# Patient Record
Sex: Male | Born: 1955 | Race: Black or African American | Hispanic: No | Marital: Single | Smoking: Current every day smoker
Health system: Southern US, Community
[De-identification: ages and names within clinical notes are randomized; demographics above are authoritative.]

## PROBLEM LIST (undated history)

## (undated) ENCOUNTER — Emergency Department (HOSPITAL_COMMUNITY): Disposition: A | Discharge status: Home / Self Care | Payer: Medicare Other

## (undated) DIAGNOSIS — K253 Acute gastric ulcer without hemorrhage or perforation: Secondary | ICD-10-CM

## (undated) DIAGNOSIS — I639 Cerebral infarction, unspecified: Secondary | ICD-10-CM

## (undated) DIAGNOSIS — H5440 Blindness, one eye, unspecified eye: Secondary | ICD-10-CM

## (undated) DIAGNOSIS — F1410 Cocaine abuse, uncomplicated: Secondary | ICD-10-CM

## (undated) DIAGNOSIS — R569 Unspecified convulsions: Secondary | ICD-10-CM

## (undated) DIAGNOSIS — G40909 Epilepsy, unspecified, not intractable, without status epilepticus: Secondary | ICD-10-CM

## (undated) HISTORY — DX: Cerebral infarction, unspecified: I63.9

## (undated) HISTORY — PX: NO PAST SURGERIES: SHX2092

## (undated) HISTORY — DX: Acute gastric ulcer without hemorrhage or perforation: K25.3

## (undated) HISTORY — DX: Cocaine abuse, uncomplicated: F14.10

---

## 1997-10-26 ENCOUNTER — Emergency Department (HOSPITAL_COMMUNITY): Admission: EM | Admit: 1997-10-26 | Discharge: 1997-10-26 | Payer: Self-pay | Admitting: Emergency Medicine

## 1997-11-02 ENCOUNTER — Other Ambulatory Visit: Admission: RE | Admit: 1997-11-02 | Discharge: 1997-11-02 | Payer: Self-pay | Admitting: Family Medicine

## 1998-02-04 ENCOUNTER — Emergency Department (HOSPITAL_COMMUNITY): Admission: EM | Admit: 1998-02-04 | Discharge: 1998-02-04 | Payer: Self-pay | Admitting: Emergency Medicine

## 1998-04-05 ENCOUNTER — Emergency Department (HOSPITAL_COMMUNITY): Admission: EM | Admit: 1998-04-05 | Discharge: 1998-04-05 | Payer: Self-pay | Admitting: Emergency Medicine

## 1998-08-16 ENCOUNTER — Emergency Department (HOSPITAL_COMMUNITY): Admission: EM | Admit: 1998-08-16 | Discharge: 1998-08-16 | Payer: Self-pay | Admitting: Emergency Medicine

## 1999-04-05 ENCOUNTER — Emergency Department (HOSPITAL_COMMUNITY): Admission: EM | Admit: 1999-04-05 | Discharge: 1999-04-05 | Payer: Self-pay | Admitting: Emergency Medicine

## 1999-04-05 ENCOUNTER — Encounter: Payer: Self-pay | Admitting: Emergency Medicine

## 2000-10-03 ENCOUNTER — Emergency Department (HOSPITAL_COMMUNITY): Admission: EM | Admit: 2000-10-03 | Discharge: 2000-10-03 | Payer: Self-pay | Admitting: Emergency Medicine

## 2001-06-10 ENCOUNTER — Emergency Department (HOSPITAL_COMMUNITY): Admission: EM | Admit: 2001-06-10 | Discharge: 2001-06-10 | Payer: Self-pay | Admitting: Emergency Medicine

## 2001-07-21 ENCOUNTER — Emergency Department (HOSPITAL_COMMUNITY): Admission: EM | Admit: 2001-07-21 | Discharge: 2001-07-21 | Payer: Self-pay | Admitting: *Deleted

## 2001-08-02 ENCOUNTER — Emergency Department (HOSPITAL_COMMUNITY): Admission: EM | Admit: 2001-08-02 | Discharge: 2001-08-03 | Payer: Self-pay | Admitting: Emergency Medicine

## 2001-08-02 ENCOUNTER — Encounter: Payer: Self-pay | Admitting: Emergency Medicine

## 2001-08-04 ENCOUNTER — Inpatient Hospital Stay (HOSPITAL_COMMUNITY): Admission: EM | Admit: 2001-08-04 | Discharge: 2001-08-06 | Payer: Self-pay | Admitting: Emergency Medicine

## 2001-08-04 ENCOUNTER — Encounter: Payer: Self-pay | Admitting: Internal Medicine

## 2001-08-05 ENCOUNTER — Encounter: Payer: Self-pay | Admitting: Internal Medicine

## 2001-10-30 ENCOUNTER — Emergency Department (HOSPITAL_COMMUNITY): Admission: EM | Admit: 2001-10-30 | Discharge: 2001-10-30 | Payer: Self-pay | Admitting: Emergency Medicine

## 2002-01-01 ENCOUNTER — Emergency Department (HOSPITAL_COMMUNITY): Admission: EM | Admit: 2002-01-01 | Discharge: 2002-01-01 | Payer: Self-pay | Admitting: Emergency Medicine

## 2002-03-03 ENCOUNTER — Emergency Department (HOSPITAL_COMMUNITY): Admission: EM | Admit: 2002-03-03 | Discharge: 2002-03-03 | Payer: Self-pay | Admitting: Emergency Medicine

## 2002-05-09 ENCOUNTER — Encounter: Payer: Self-pay | Admitting: Emergency Medicine

## 2002-05-09 ENCOUNTER — Emergency Department (HOSPITAL_COMMUNITY): Admission: EM | Admit: 2002-05-09 | Discharge: 2002-05-09 | Payer: Self-pay | Admitting: Emergency Medicine

## 2002-06-11 ENCOUNTER — Emergency Department (HOSPITAL_COMMUNITY): Admission: EM | Admit: 2002-06-11 | Discharge: 2002-06-11 | Payer: Self-pay | Admitting: Emergency Medicine

## 2002-07-04 ENCOUNTER — Emergency Department (HOSPITAL_COMMUNITY): Admission: EM | Admit: 2002-07-04 | Discharge: 2002-07-04 | Payer: Self-pay | Admitting: Emergency Medicine

## 2002-07-04 ENCOUNTER — Encounter: Payer: Self-pay | Admitting: Emergency Medicine

## 2002-07-19 ENCOUNTER — Emergency Department (HOSPITAL_COMMUNITY): Admission: EM | Admit: 2002-07-19 | Discharge: 2002-07-19 | Payer: Self-pay | Admitting: Emergency Medicine

## 2002-10-29 ENCOUNTER — Emergency Department (HOSPITAL_COMMUNITY): Admission: EM | Admit: 2002-10-29 | Discharge: 2002-10-29 | Payer: Self-pay | Admitting: Emergency Medicine

## 2003-02-05 ENCOUNTER — Emergency Department (HOSPITAL_COMMUNITY): Admission: EM | Admit: 2003-02-05 | Discharge: 2003-02-06 | Payer: Self-pay | Admitting: Emergency Medicine

## 2003-03-08 ENCOUNTER — Emergency Department (HOSPITAL_COMMUNITY): Admission: EM | Admit: 2003-03-08 | Discharge: 2003-03-09 | Payer: Self-pay | Admitting: Emergency Medicine

## 2003-06-02 ENCOUNTER — Emergency Department (HOSPITAL_COMMUNITY): Admission: EM | Admit: 2003-06-02 | Discharge: 2003-06-02 | Payer: Self-pay | Admitting: Emergency Medicine

## 2003-06-22 ENCOUNTER — Emergency Department (HOSPITAL_COMMUNITY): Admission: EM | Admit: 2003-06-22 | Discharge: 2003-06-22 | Payer: Self-pay | Admitting: Emergency Medicine

## 2004-01-23 ENCOUNTER — Ambulatory Visit: Payer: Self-pay | Admitting: Nurse Practitioner

## 2004-03-10 ENCOUNTER — Emergency Department (HOSPITAL_COMMUNITY): Admission: EM | Admit: 2004-03-10 | Discharge: 2004-03-10 | Payer: Self-pay | Admitting: Emergency Medicine

## 2004-05-15 ENCOUNTER — Ambulatory Visit: Payer: Self-pay | Admitting: Nurse Practitioner

## 2004-05-28 ENCOUNTER — Emergency Department (HOSPITAL_COMMUNITY): Admission: EM | Admit: 2004-05-28 | Discharge: 2004-05-28 | Payer: Self-pay | Admitting: Emergency Medicine

## 2004-07-14 ENCOUNTER — Emergency Department (HOSPITAL_COMMUNITY): Admission: EM | Admit: 2004-07-14 | Discharge: 2004-07-14 | Payer: Self-pay | Admitting: *Deleted

## 2004-08-21 ENCOUNTER — Ambulatory Visit: Payer: Self-pay | Admitting: Nurse Practitioner

## 2004-09-23 ENCOUNTER — Ambulatory Visit: Payer: Self-pay | Admitting: Nurse Practitioner

## 2004-12-18 ENCOUNTER — Emergency Department (HOSPITAL_COMMUNITY): Admission: EM | Admit: 2004-12-18 | Discharge: 2004-12-18 | Payer: Self-pay | Admitting: Emergency Medicine

## 2005-01-09 ENCOUNTER — Emergency Department (HOSPITAL_COMMUNITY): Admission: EM | Admit: 2005-01-09 | Discharge: 2005-01-10 | Payer: Self-pay | Admitting: Emergency Medicine

## 2005-01-31 ENCOUNTER — Emergency Department (HOSPITAL_COMMUNITY): Admission: EM | Admit: 2005-01-31 | Discharge: 2005-01-31 | Payer: Self-pay | Admitting: Emergency Medicine

## 2005-02-23 ENCOUNTER — Emergency Department (HOSPITAL_COMMUNITY): Admission: EM | Admit: 2005-02-23 | Discharge: 2005-02-23 | Payer: Self-pay | Admitting: Emergency Medicine

## 2005-03-10 ENCOUNTER — Ambulatory Visit: Payer: Self-pay | Admitting: Nurse Practitioner

## 2005-03-14 ENCOUNTER — Emergency Department (HOSPITAL_COMMUNITY): Admission: EM | Admit: 2005-03-14 | Discharge: 2005-03-14 | Payer: Self-pay | Admitting: Emergency Medicine

## 2005-04-08 ENCOUNTER — Inpatient Hospital Stay (HOSPITAL_COMMUNITY): Admission: RE | Admit: 2005-04-08 | Discharge: 2005-04-10 | Payer: Self-pay | Admitting: Internal Medicine

## 2005-04-14 ENCOUNTER — Emergency Department (HOSPITAL_COMMUNITY): Admission: EM | Admit: 2005-04-14 | Discharge: 2005-04-14 | Payer: Self-pay | Admitting: Emergency Medicine

## 2005-05-04 ENCOUNTER — Ambulatory Visit: Payer: Self-pay | Admitting: Nurse Practitioner

## 2005-05-05 ENCOUNTER — Inpatient Hospital Stay (HOSPITAL_COMMUNITY): Admission: EM | Admit: 2005-05-05 | Discharge: 2005-05-11 | Payer: Self-pay | Admitting: Emergency Medicine

## 2005-07-11 ENCOUNTER — Emergency Department (HOSPITAL_COMMUNITY): Admission: EM | Admit: 2005-07-11 | Discharge: 2005-07-12 | Payer: Self-pay | Admitting: *Deleted

## 2005-07-12 ENCOUNTER — Emergency Department (HOSPITAL_COMMUNITY): Admission: EM | Admit: 2005-07-12 | Discharge: 2005-07-12 | Payer: Self-pay | Admitting: Emergency Medicine

## 2005-07-20 ENCOUNTER — Ambulatory Visit: Payer: Self-pay | Admitting: Nurse Practitioner

## 2005-09-03 ENCOUNTER — Emergency Department (HOSPITAL_COMMUNITY): Admission: EM | Admit: 2005-09-03 | Discharge: 2005-09-03 | Payer: Self-pay | Admitting: Emergency Medicine

## 2005-09-07 ENCOUNTER — Ambulatory Visit: Payer: Self-pay | Admitting: Nurse Practitioner

## 2005-10-22 ENCOUNTER — Ambulatory Visit: Payer: Self-pay | Admitting: Family Medicine

## 2005-10-28 ENCOUNTER — Emergency Department (HOSPITAL_COMMUNITY): Admission: EM | Admit: 2005-10-28 | Discharge: 2005-10-28 | Payer: Self-pay | Admitting: Emergency Medicine

## 2005-12-29 ENCOUNTER — Emergency Department (HOSPITAL_COMMUNITY): Admission: EM | Admit: 2005-12-29 | Discharge: 2005-12-29 | Payer: Self-pay | Admitting: Emergency Medicine

## 2006-01-04 ENCOUNTER — Ambulatory Visit: Payer: Self-pay | Admitting: Nurse Practitioner

## 2006-01-06 ENCOUNTER — Emergency Department (HOSPITAL_COMMUNITY): Admission: EM | Admit: 2006-01-06 | Discharge: 2006-01-06 | Payer: Self-pay | Admitting: *Deleted

## 2006-02-02 ENCOUNTER — Ambulatory Visit: Payer: Self-pay | Admitting: Nurse Practitioner

## 2006-07-10 ENCOUNTER — Inpatient Hospital Stay (HOSPITAL_COMMUNITY): Admission: EM | Admit: 2006-07-10 | Discharge: 2006-07-12 | Payer: Self-pay | Admitting: Emergency Medicine

## 2006-07-17 ENCOUNTER — Emergency Department (HOSPITAL_COMMUNITY): Admission: EM | Admit: 2006-07-17 | Discharge: 2006-07-17 | Payer: Self-pay | Admitting: Emergency Medicine

## 2006-07-18 ENCOUNTER — Inpatient Hospital Stay (HOSPITAL_COMMUNITY): Admission: EM | Admit: 2006-07-18 | Discharge: 2006-07-21 | Payer: Self-pay | Admitting: Emergency Medicine

## 2006-08-05 ENCOUNTER — Ambulatory Visit: Payer: Self-pay | Admitting: Nurse Practitioner

## 2006-09-28 ENCOUNTER — Emergency Department (HOSPITAL_COMMUNITY): Admission: EM | Admit: 2006-09-28 | Discharge: 2006-09-28 | Payer: Self-pay | Admitting: Emergency Medicine

## 2006-12-13 ENCOUNTER — Emergency Department (HOSPITAL_COMMUNITY): Admission: EM | Admit: 2006-12-13 | Discharge: 2006-12-13 | Payer: Self-pay | Admitting: Emergency Medicine

## 2006-12-17 ENCOUNTER — Emergency Department (HOSPITAL_COMMUNITY): Admission: EM | Admit: 2006-12-17 | Discharge: 2006-12-18 | Payer: Self-pay | Admitting: Emergency Medicine

## 2006-12-17 ENCOUNTER — Emergency Department (HOSPITAL_COMMUNITY): Admission: EM | Admit: 2006-12-17 | Discharge: 2006-12-17 | Payer: Self-pay | Admitting: Emergency Medicine

## 2006-12-29 ENCOUNTER — Emergency Department (HOSPITAL_COMMUNITY): Admission: EM | Admit: 2006-12-29 | Discharge: 2006-12-29 | Payer: Self-pay | Admitting: Emergency Medicine

## 2006-12-30 ENCOUNTER — Emergency Department (HOSPITAL_COMMUNITY): Admission: EM | Admit: 2006-12-30 | Discharge: 2006-12-30 | Payer: Self-pay | Admitting: Emergency Medicine

## 2007-01-18 ENCOUNTER — Emergency Department (HOSPITAL_COMMUNITY): Admission: EM | Admit: 2007-01-18 | Discharge: 2007-01-18 | Payer: Self-pay | Admitting: Emergency Medicine

## 2007-01-19 ENCOUNTER — Emergency Department (HOSPITAL_COMMUNITY): Admission: EM | Admit: 2007-01-19 | Discharge: 2007-01-19 | Payer: Self-pay | Admitting: Emergency Medicine

## 2007-01-20 ENCOUNTER — Ambulatory Visit: Payer: Self-pay | Admitting: Internal Medicine

## 2007-01-21 ENCOUNTER — Emergency Department (HOSPITAL_COMMUNITY): Admission: EM | Admit: 2007-01-21 | Discharge: 2007-01-21 | Payer: Self-pay | Admitting: Emergency Medicine

## 2007-02-03 ENCOUNTER — Ambulatory Visit: Payer: Self-pay | Admitting: Internal Medicine

## 2007-02-03 ENCOUNTER — Encounter (INDEPENDENT_AMBULATORY_CARE_PROVIDER_SITE_OTHER): Payer: Self-pay | Admitting: Nurse Practitioner

## 2007-02-03 LAB — CONVERTED CEMR LAB: Valproic Acid Lvl: 15.2 ug/mL — ABNORMAL LOW (ref 50.0–100.0)

## 2007-02-10 ENCOUNTER — Emergency Department (HOSPITAL_COMMUNITY): Admission: EM | Admit: 2007-02-10 | Discharge: 2007-02-10 | Payer: Self-pay | Admitting: Emergency Medicine

## 2007-02-11 ENCOUNTER — Emergency Department (HOSPITAL_COMMUNITY): Admission: EM | Admit: 2007-02-11 | Discharge: 2007-02-11 | Payer: Self-pay | Admitting: Emergency Medicine

## 2007-07-01 ENCOUNTER — Emergency Department (HOSPITAL_COMMUNITY): Admission: EM | Admit: 2007-07-01 | Discharge: 2007-07-01 | Payer: Self-pay | Admitting: Emergency Medicine

## 2007-08-14 ENCOUNTER — Emergency Department (HOSPITAL_COMMUNITY): Admission: EM | Admit: 2007-08-14 | Discharge: 2007-08-14 | Payer: Self-pay | Admitting: Emergency Medicine

## 2007-09-01 ENCOUNTER — Ambulatory Visit: Payer: Self-pay | Admitting: Internal Medicine

## 2007-09-14 ENCOUNTER — Ambulatory Visit: Payer: Self-pay | Admitting: Internal Medicine

## 2007-09-14 ENCOUNTER — Encounter: Payer: Self-pay | Admitting: Internal Medicine

## 2007-09-14 LAB — CONVERTED CEMR LAB
Phenytoin Lvl: 12.3 ug/mL (ref 10.0–20.0)
Valproic Acid Lvl: 71.2 ug/mL (ref 50.0–100.0)

## 2007-10-10 ENCOUNTER — Emergency Department (HOSPITAL_COMMUNITY): Admission: EM | Admit: 2007-10-10 | Discharge: 2007-10-10 | Payer: Self-pay | Admitting: Emergency Medicine

## 2008-01-23 ENCOUNTER — Emergency Department (HOSPITAL_COMMUNITY): Admission: EM | Admit: 2008-01-23 | Discharge: 2008-01-23 | Payer: Self-pay | Admitting: Emergency Medicine

## 2008-02-03 ENCOUNTER — Emergency Department (HOSPITAL_COMMUNITY): Admission: EM | Admit: 2008-02-03 | Discharge: 2008-02-03 | Payer: Self-pay | Admitting: Emergency Medicine

## 2008-02-23 ENCOUNTER — Inpatient Hospital Stay (HOSPITAL_COMMUNITY): Admission: EM | Admit: 2008-02-23 | Discharge: 2008-02-29 | Payer: Self-pay | Admitting: Emergency Medicine

## 2008-02-27 ENCOUNTER — Encounter (INDEPENDENT_AMBULATORY_CARE_PROVIDER_SITE_OTHER): Payer: Self-pay | Admitting: Diagnostic Radiology

## 2008-03-04 ENCOUNTER — Emergency Department (HOSPITAL_COMMUNITY): Admission: EM | Admit: 2008-03-04 | Discharge: 2008-03-04 | Payer: Self-pay | Admitting: Emergency Medicine

## 2008-03-11 ENCOUNTER — Emergency Department (HOSPITAL_COMMUNITY): Admission: EM | Admit: 2008-03-11 | Discharge: 2008-03-11 | Payer: Self-pay | Admitting: Emergency Medicine

## 2008-03-13 ENCOUNTER — Emergency Department (HOSPITAL_COMMUNITY): Admission: EM | Admit: 2008-03-13 | Discharge: 2008-03-13 | Payer: Self-pay | Admitting: Emergency Medicine

## 2008-03-14 ENCOUNTER — Ambulatory Visit: Payer: Self-pay | Admitting: Internal Medicine

## 2008-03-14 DIAGNOSIS — J852 Abscess of lung without pneumonia: Secondary | ICD-10-CM

## 2008-03-14 HISTORY — DX: Abscess of lung without pneumonia: J85.2

## 2008-03-25 ENCOUNTER — Emergency Department (HOSPITAL_COMMUNITY): Admission: EM | Admit: 2008-03-25 | Discharge: 2008-03-25 | Payer: Self-pay | Admitting: Emergency Medicine

## 2008-03-26 ENCOUNTER — Emergency Department (HOSPITAL_COMMUNITY): Admission: EM | Admit: 2008-03-26 | Discharge: 2008-03-26 | Payer: Self-pay | Admitting: Emergency Medicine

## 2008-03-28 ENCOUNTER — Emergency Department (HOSPITAL_COMMUNITY): Admission: EM | Admit: 2008-03-28 | Discharge: 2008-03-28 | Payer: Self-pay | Admitting: Emergency Medicine

## 2008-04-06 ENCOUNTER — Ambulatory Visit: Payer: Self-pay | Admitting: Internal Medicine

## 2008-06-11 ENCOUNTER — Emergency Department (HOSPITAL_COMMUNITY): Admission: EM | Admit: 2008-06-11 | Discharge: 2008-06-11 | Payer: Self-pay | Admitting: Emergency Medicine

## 2008-08-16 ENCOUNTER — Emergency Department (HOSPITAL_COMMUNITY): Admission: EM | Admit: 2008-08-16 | Discharge: 2008-08-16 | Payer: Self-pay | Admitting: Emergency Medicine

## 2008-09-04 ENCOUNTER — Telehealth (INDEPENDENT_AMBULATORY_CARE_PROVIDER_SITE_OTHER): Payer: Self-pay | Admitting: *Deleted

## 2008-09-21 ENCOUNTER — Emergency Department (HOSPITAL_COMMUNITY): Admission: EM | Admit: 2008-09-21 | Discharge: 2008-09-21 | Payer: Self-pay | Admitting: Emergency Medicine

## 2009-04-09 ENCOUNTER — Emergency Department (HOSPITAL_COMMUNITY): Admission: EM | Admit: 2009-04-09 | Discharge: 2009-04-09 | Payer: Self-pay | Admitting: Emergency Medicine

## 2009-05-02 ENCOUNTER — Emergency Department (HOSPITAL_COMMUNITY): Admission: EM | Admit: 2009-05-02 | Discharge: 2009-05-02 | Payer: Self-pay | Admitting: Emergency Medicine

## 2009-05-05 ENCOUNTER — Emergency Department (HOSPITAL_COMMUNITY): Admission: EM | Admit: 2009-05-05 | Discharge: 2009-05-06 | Payer: Self-pay | Admitting: Emergency Medicine

## 2009-05-10 ENCOUNTER — Emergency Department (HOSPITAL_COMMUNITY): Admission: EM | Admit: 2009-05-10 | Discharge: 2009-05-10 | Payer: Self-pay | Admitting: Emergency Medicine

## 2009-05-21 ENCOUNTER — Ambulatory Visit: Payer: Self-pay | Admitting: Internal Medicine

## 2009-07-25 ENCOUNTER — Ambulatory Visit: Payer: Self-pay | Admitting: Internal Medicine

## 2009-09-18 ENCOUNTER — Observation Stay (HOSPITAL_COMMUNITY): Admission: EM | Admit: 2009-09-18 | Discharge: 2009-09-23 | Payer: Self-pay | Admitting: Emergency Medicine

## 2009-09-20 ENCOUNTER — Ambulatory Visit: Payer: Self-pay | Admitting: Physical Medicine & Rehabilitation

## 2009-10-07 ENCOUNTER — Emergency Department (HOSPITAL_COMMUNITY): Admission: EM | Admit: 2009-10-07 | Discharge: 2009-10-07 | Payer: Self-pay | Admitting: Emergency Medicine

## 2009-10-12 ENCOUNTER — Emergency Department (HOSPITAL_COMMUNITY): Admission: EM | Admit: 2009-10-12 | Discharge: 2009-10-12 | Payer: Self-pay | Admitting: Emergency Medicine

## 2009-11-14 ENCOUNTER — Emergency Department (HOSPITAL_COMMUNITY): Admission: EM | Admit: 2009-11-14 | Discharge: 2009-11-14 | Payer: Self-pay | Admitting: Emergency Medicine

## 2009-11-16 ENCOUNTER — Emergency Department (HOSPITAL_COMMUNITY): Admission: EM | Admit: 2009-11-16 | Discharge: 2009-11-16 | Payer: Self-pay | Admitting: Emergency Medicine

## 2009-11-16 ENCOUNTER — Observation Stay (HOSPITAL_COMMUNITY): Admission: EM | Admit: 2009-11-16 | Discharge: 2009-11-18 | Payer: Self-pay | Admitting: Emergency Medicine

## 2009-11-26 ENCOUNTER — Emergency Department (HOSPITAL_COMMUNITY): Admission: EM | Admit: 2009-11-26 | Discharge: 2009-11-26 | Payer: Self-pay | Admitting: Emergency Medicine

## 2009-12-04 ENCOUNTER — Ambulatory Visit: Payer: Self-pay | Admitting: Internal Medicine

## 2009-12-04 ENCOUNTER — Encounter (INDEPENDENT_AMBULATORY_CARE_PROVIDER_SITE_OTHER): Payer: Self-pay | Admitting: Internal Medicine

## 2009-12-04 LAB — CONVERTED CEMR LAB: Valproic Acid Lvl: 103.1 ug/mL — ABNORMAL HIGH (ref 50.0–100.0)

## 2009-12-10 ENCOUNTER — Emergency Department (HOSPITAL_COMMUNITY): Admission: EM | Admit: 2009-12-10 | Discharge: 2009-12-10 | Payer: Self-pay | Admitting: Emergency Medicine

## 2010-01-17 ENCOUNTER — Emergency Department (HOSPITAL_COMMUNITY)
Admission: EM | Admit: 2010-01-17 | Discharge: 2010-01-17 | Payer: Self-pay | Source: Home / Self Care | Admitting: Emergency Medicine

## 2010-02-23 IMAGING — CT CT CHEST W/O CM
3 series · 17 of 29 positions shown, 19 images · non-contrast
Comparison: Chest radiograph 02/23/2008

CLINICAL DATA: Chest pain.  Difficulty breathing.

CT CHEST WITHOUT CONTRAST
TECHNIQUE: Multidetector CT imaging of the chest was performed
following the standard protocol without IV contrast.

[Series 2: routine chest · axial · 0.70mm/px · z∈[-262,-87]mm · 5 of 59 slices shown, 7 images]
[im 12/59  mediastinal]
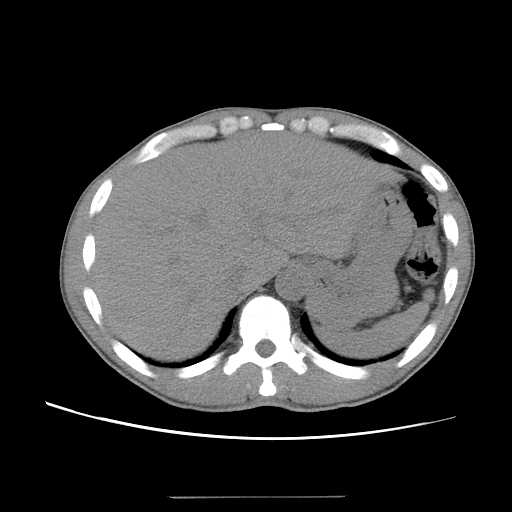
[im 12/59  lung]
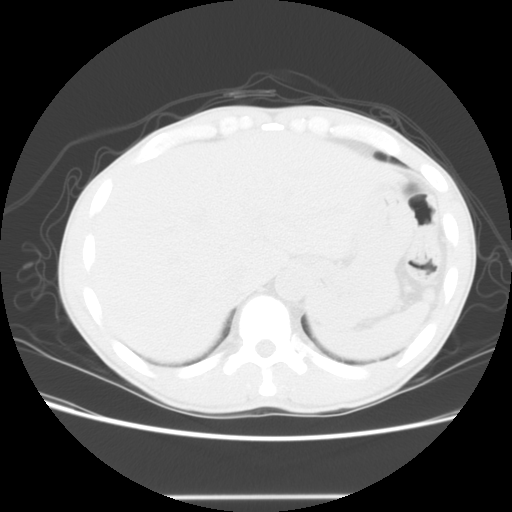
[im 24/59  lung]
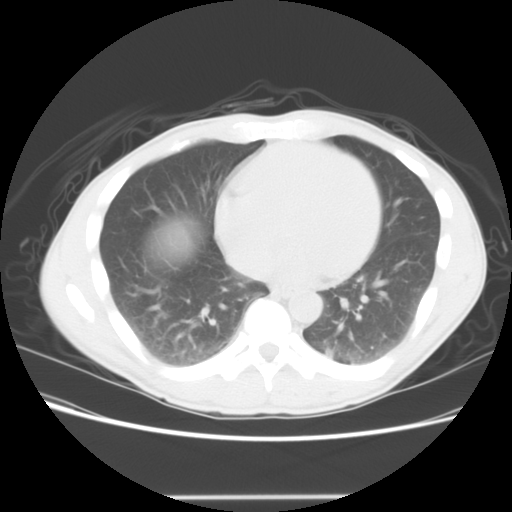
[im 32/59  lung]
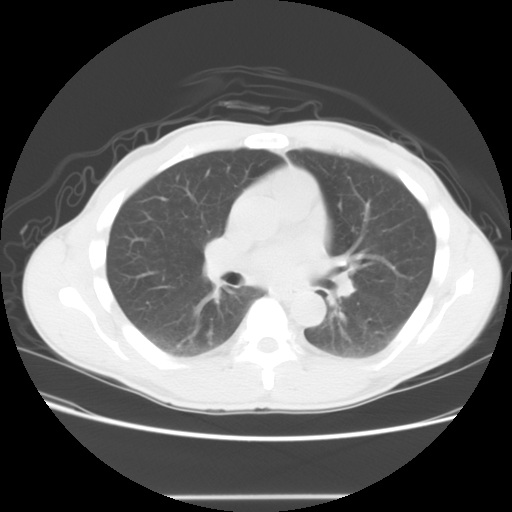
[im 35/59  lung]
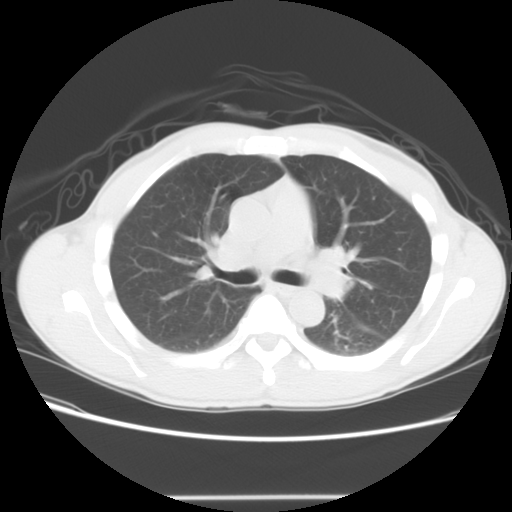
[im 47/59  mediastinal]
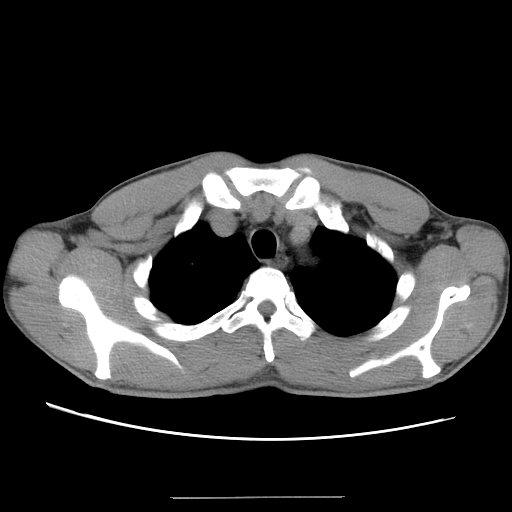
[im 47/59  lung]
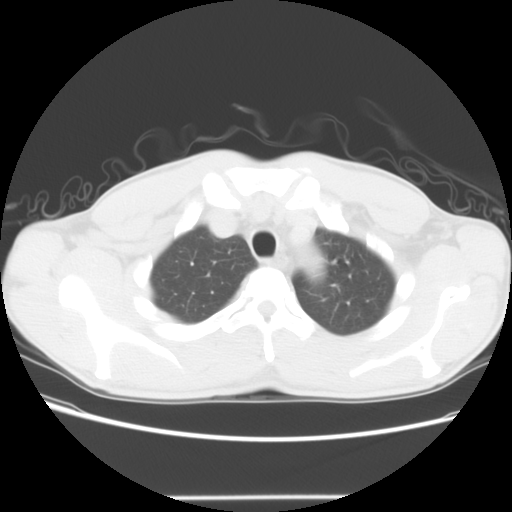

[Series 400: reformatted · sagittal · 0.70mm/px · 8 of 95 slices shown (1 of 2)]
[im 10/95  lung]
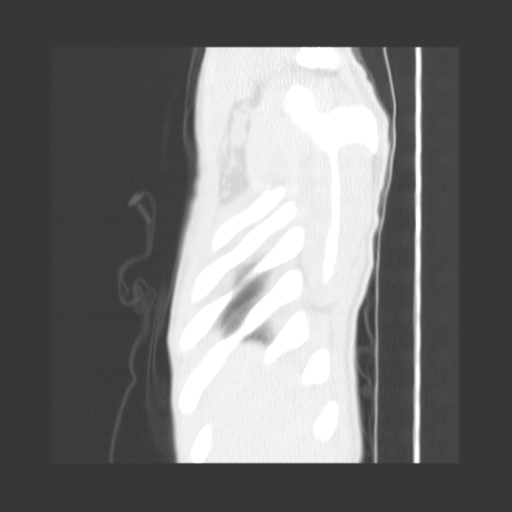
[im 19/95  lung]
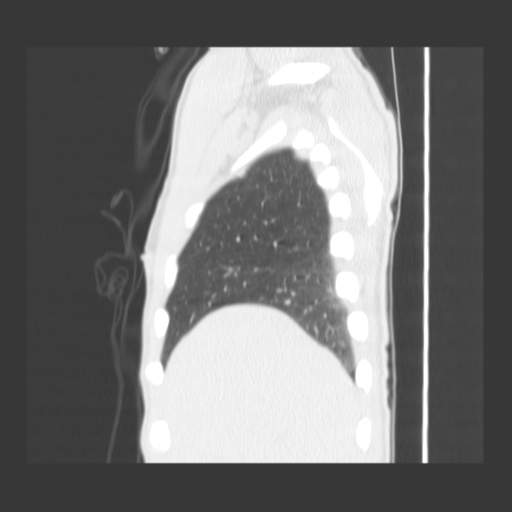
[im 29/95  lung]
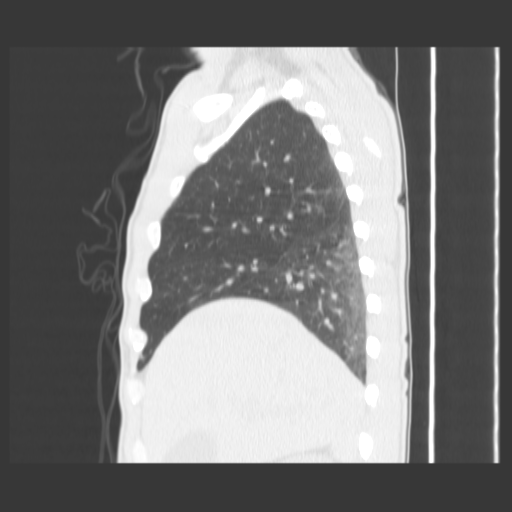
[im 38/95  lung]
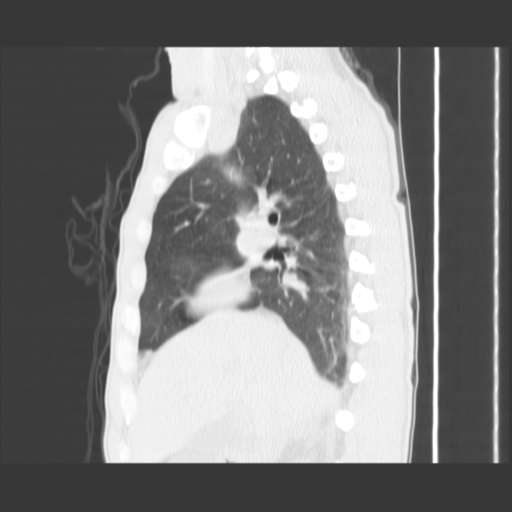
[im 57/95  lung]
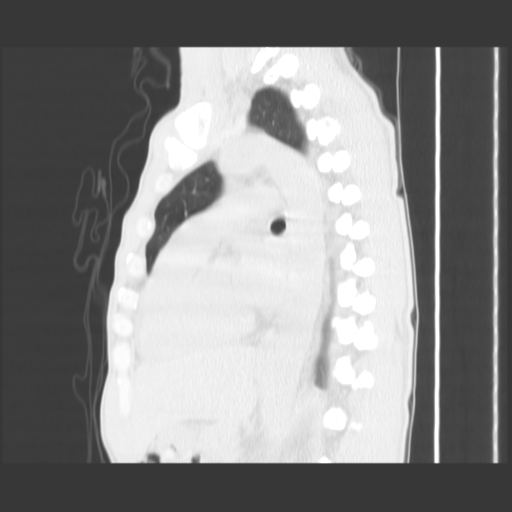
[im 66/95  lung]
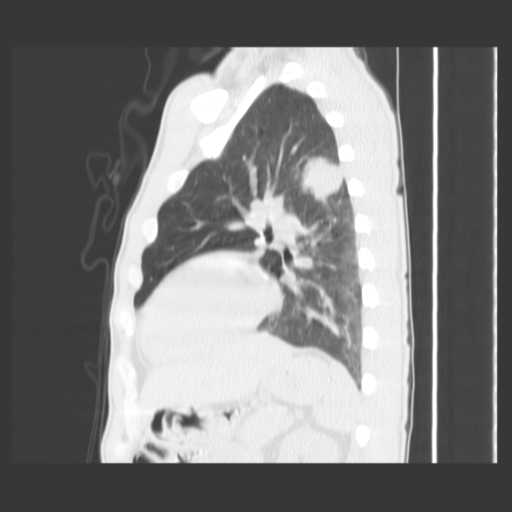
[im 76/95  lung]
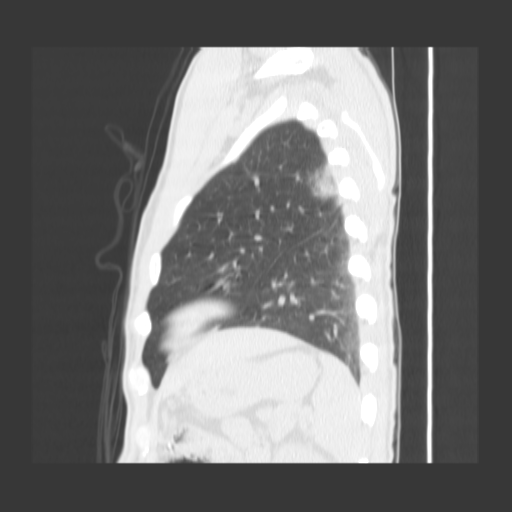
[im 85/95  lung]
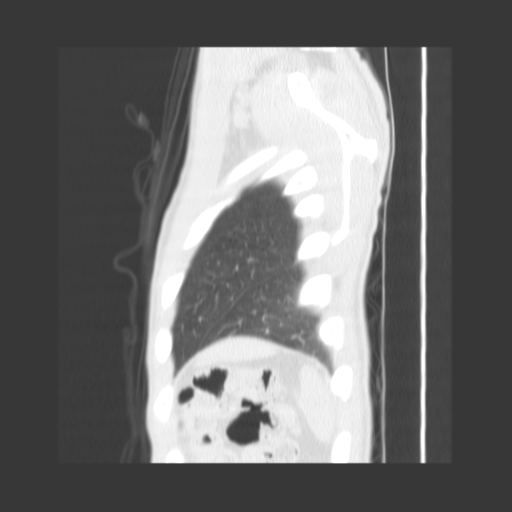

[Series 401: reformatted · coronal · 0.70mm/px · 4 of 95 slices shown (2 of 2)]
[im 10/95  lung]
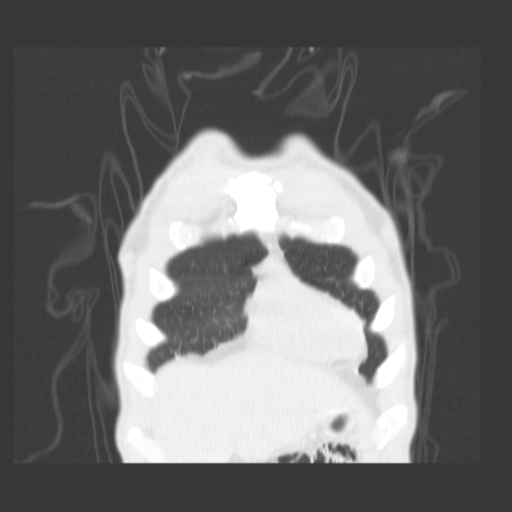
[im 19/95  lung]
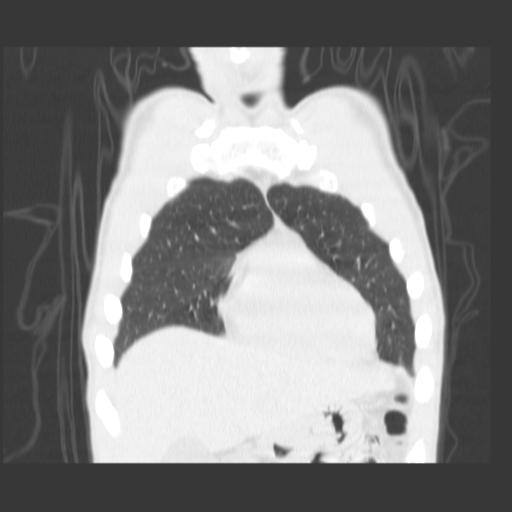
[im 29/95  lung]
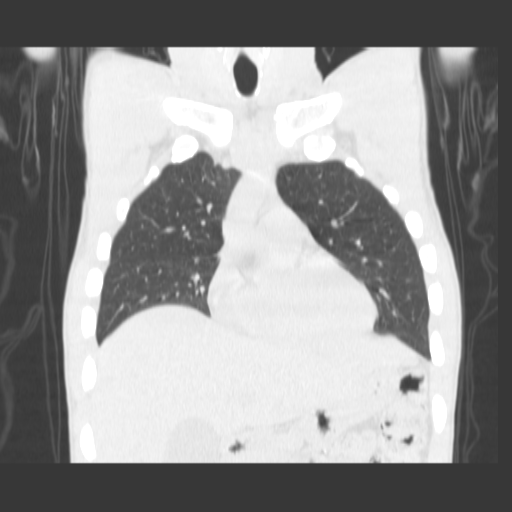
[im 38/95  lung]
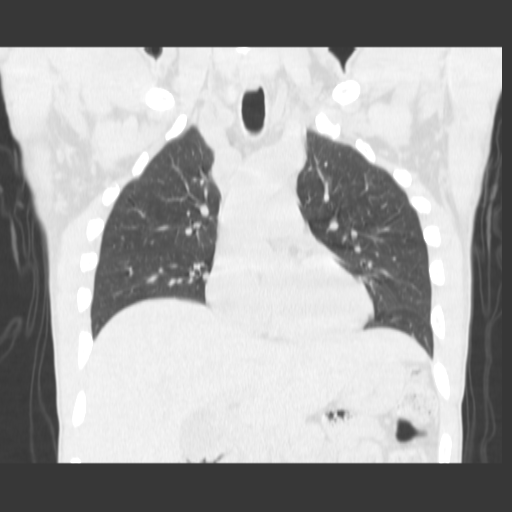

[17 of 29 positions shown; findings below may reference images not displayed]

FINDINGS: 3.1 x 4.5 cm (AP by transverse) mass is present in the
apical posterior segment of the left upper lobe.  Craniocaudal
measurement 3.1 cm.  This has central low attenuation, suggesting
necrosis or liquefaction associated with abscess.  Mild coronary
artery atherosclerotic calcification. If office based assessment of
coronary risk factors has not been performed, it is now
recommended.   No pericardial or pleural effusion.  Dependent
atelectasis noted within the lungs.  No other pulmonary
nodules/masses are identified.  Exam is limited by noncontrast
technique.  No definite mediastinal adenopathy.  Small AP window
lymph nodes are present with largest measuring 9 mm short axis.
Evaluation of hilar adenopathy limited by noncontrast technique.
Incidental imaging the upper abdomen demonstrates multiple
calcified gallstones adrenal glands appear within normal limits
bilaterally.  No aggressive bony lesions are present.
IMPRESSION: 1.  Apical posterior segment left upper lobe mass measuring 4.5 x
3.1 cm with central low attenuation suggesting either abscess or
necrotic neoplasm.
2.  Coronary artery atherosclerosis.
3.  Cholelithiasis.

## 2010-02-25 ENCOUNTER — Emergency Department (HOSPITAL_COMMUNITY): Admission: EM | Admit: 2010-02-25 | Discharge: 2010-02-25 | Payer: Self-pay | Admitting: Emergency Medicine

## 2010-03-26 ENCOUNTER — Emergency Department (HOSPITAL_COMMUNITY)
Admission: EM | Admit: 2010-03-26 | Discharge: 2010-03-26 | Payer: Self-pay | Source: Home / Self Care | Admitting: Emergency Medicine

## 2010-04-01 ENCOUNTER — Emergency Department (HOSPITAL_COMMUNITY)
Admission: EM | Admit: 2010-04-01 | Discharge: 2010-04-01 | Payer: Self-pay | Source: Home / Self Care | Admitting: Emergency Medicine

## 2010-04-17 ENCOUNTER — Emergency Department (HOSPITAL_COMMUNITY)
Admission: EM | Admit: 2010-04-17 | Discharge: 2010-04-17 | Payer: Self-pay | Source: Home / Self Care | Admitting: Emergency Medicine

## 2010-06-20 ENCOUNTER — Emergency Department (HOSPITAL_COMMUNITY)
Admission: EM | Admit: 2010-06-20 | Discharge: 2010-06-20 | Disposition: A | Discharge status: Home / Self Care | Payer: PRIVATE HEALTH INSURANCE | Attending: Emergency Medicine | Admitting: Emergency Medicine

## 2010-06-20 DIAGNOSIS — G40109 Localization-related (focal) (partial) symptomatic epilepsy and epileptic syndromes with simple partial seizures, not intractable, without status epilepticus: Secondary | ICD-10-CM | POA: Insufficient documentation

## 2010-06-20 DIAGNOSIS — R51 Headache: Secondary | ICD-10-CM | POA: Insufficient documentation

## 2010-06-20 LAB — CBC
HCT: 42.7 % (ref 39.0–52.0)
MCHC: 32.8 g/dL (ref 30.0–36.0)
MCV: 90.5 fL (ref 78.0–100.0)
RDW: 14.3 % (ref 11.5–15.5)

## 2010-06-20 LAB — BASIC METABOLIC PANEL
BUN: 23 mg/dL (ref 6–23)
Calcium: 8.8 mg/dL (ref 8.4–10.5)
Creatinine, Ser: 1.02 mg/dL (ref 0.4–1.5)
GFR calc non Af Amer: >60 mL/min (ref >60)
Glucose, Bld: 145 mg/dL — ABNORMAL HIGH (ref 70–99)

## 2010-06-20 LAB — URINALYSIS, ROUTINE W REFLEX MICROSCOPIC
Protein, ur: NEGATIVE mg/dL
Specific Gravity, Urine: 1.035 — ABNORMAL HIGH (ref 1.005–1.030)
Urine Glucose, Fasting: NEGATIVE mg/dL
pH: 6 (ref 5.0–8.0)

## 2010-06-20 LAB — DIFFERENTIAL
Eosinophils Absolute: 0 10*3/uL (ref 0.0–0.7)
Eosinophils Relative: 1 % (ref 0–5)
Lymphocytes Relative: 53 % — ABNORMAL HIGH (ref 12–46)
Lymphs Abs: 2.4 10*3/uL (ref 0.7–4.0)
Monocytes Absolute: 0.5 10*3/uL (ref 0.1–1.0)

## 2010-06-20 LAB — PHENYTOIN LEVEL, TOTAL: Phenytoin Lvl: 16.3 ug/mL (ref 10.0–20.0)

## 2010-07-07 LAB — URINALYSIS, ROUTINE W REFLEX MICROSCOPIC
Hgb urine dipstick: NEGATIVE
Protein, ur: NEGATIVE mg/dL
Urobilinogen, UA: 1 mg/dL (ref 0.0–1.0)

## 2010-07-07 LAB — BASIC METABOLIC PANEL
CO2: 32 mEq/L (ref 19–32)
Calcium: 9.5 mg/dL (ref 8.4–10.5)
Creatinine, Ser: 0.82 mg/dL (ref 0.4–1.5)
GFR calc Af Amer: >60 mL/min (ref >60)

## 2010-07-07 LAB — DIFFERENTIAL
Basophils Absolute: 0 10*3/uL (ref 0.0–0.1)
Basophils Relative: 0 % (ref 0–1)
Eosinophils Absolute: 0 10*3/uL (ref 0.0–0.7)
Neutrophils Relative %: 34 % — ABNORMAL LOW (ref 43–77)

## 2010-07-07 LAB — PHENYTOIN LEVEL, TOTAL: Phenytoin Lvl: 15.5 ug/mL (ref 10.0–20.0)

## 2010-07-07 LAB — CBC
MCH: 30.5 pg (ref 26.0–34.0)
MCHC: 33.3 g/dL (ref 30.0–36.0)
Platelets: 242 10*3/uL (ref 150–400)
RBC: 4.56 MIL/uL (ref 4.22–5.81)
RDW: 14.9 % (ref 11.5–15.5)

## 2010-07-07 LAB — VALPROIC ACID LEVEL: Valproic Acid Lvl: 148.6 ug/mL — ABNORMAL HIGH (ref 50.0–100.0)

## 2010-07-10 LAB — DIFFERENTIAL
Basophils Absolute: 0 10*3/uL (ref 0.0–0.1)
Basophils Relative: 0 % (ref 0–1)
Eosinophils Absolute: 0.1 10*3/uL (ref 0.0–0.7)
Eosinophils Relative: 1 % (ref 0–5)
Lymphocytes Relative: 55 % — ABNORMAL HIGH (ref 12–46)
Lymphs Abs: 3.8 10*3/uL (ref 0.7–4.0)
Monocytes Absolute: 0.7 10*3/uL (ref 0.1–1.0)
Monocytes Relative: 9 % (ref 3–12)
Monocytes Relative: 10 % (ref 3–12)
Neutro Abs: 2.5 10*3/uL (ref 1.7–7.7)
Neutro Abs: 2.3 10*3/uL (ref 1.7–7.7)
Neutrophils Relative %: 33 % — ABNORMAL LOW (ref 43–77)

## 2010-07-10 LAB — URINALYSIS, ROUTINE W REFLEX MICROSCOPIC
Bilirubin Urine: NEGATIVE
Glucose, UA: NEGATIVE mg/dL
Hgb urine dipstick: NEGATIVE
Hgb urine dipstick: NEGATIVE
Ketones, ur: NEGATIVE mg/dL
Nitrite: NEGATIVE
Protein, ur: NEGATIVE mg/dL
Protein, ur: NEGATIVE mg/dL
Specific Gravity, Urine: 1.022 (ref 1.005–1.030)
Urobilinogen, UA: 1 mg/dL (ref 0.0–1.0)
Urobilinogen, UA: 0.2 mg/dL (ref 0.0–1.0)

## 2010-07-10 LAB — CBC
HCT: 43 % (ref 39.0–52.0)
HCT: 39.1 % (ref 39.0–52.0)
Hemoglobin: 14.4 g/dL (ref 13.0–17.0)
Hemoglobin: 13 g/dL (ref 13.0–17.0)
MCH: 29.5 pg (ref 26.0–34.0)
MCHC: 33.5 g/dL (ref 30.0–36.0)
RBC: 4.34 MIL/uL (ref 4.22–5.81)
RDW: 14.7 % (ref 11.5–15.5)

## 2010-07-10 LAB — HEPATIC FUNCTION PANEL
ALT: 21 U/L (ref 0–53)
AST: 20 U/L (ref 0–37)
Albumin: 3.7 g/dL (ref 3.5–5.2)
Alkaline Phosphatase: 63 U/L (ref 39–117)
Total Protein: 6.8 g/dL (ref 6.0–8.3)

## 2010-07-10 LAB — BASIC METABOLIC PANEL
Calcium: 8.9 mg/dL (ref 8.4–10.5)
GFR calc Af Amer: >60 mL/min (ref >60)
GFR calc non Af Amer: >60 mL/min (ref >60)
Potassium: 4.5 mEq/L (ref 3.5–5.1)
Sodium: 141 mEq/L (ref 135–145)

## 2010-07-10 LAB — RAPID URINE DRUG SCREEN, HOSP PERFORMED
Amphetamines: NOT DETECTED
Amphetamines: NOT DETECTED
Barbiturates: NOT DETECTED
Barbiturates: NOT DETECTED
Benzodiazepines: NOT DETECTED
Tetrahydrocannabinol: NOT DETECTED
Tetrahydrocannabinol: NOT DETECTED

## 2010-07-10 LAB — POCT I-STAT, CHEM 8
BUN: 27 mg/dL — ABNORMAL HIGH (ref 6–23)
Calcium, Ion: 1.17 mmol/L (ref 1.12–1.32)
Creatinine, Ser: 0.9 mg/dL (ref 0.4–1.5)
Glucose, Bld: 78 mg/dL (ref 70–99)
Hemoglobin: 15 g/dL (ref 13.0–17.0)
Sodium: 145 mEq/L (ref 135–145)
TCO2: 33 mmol/L (ref 0–100)

## 2010-07-10 LAB — VALPROIC ACID LEVEL: Valproic Acid Lvl: 54.2 ug/mL (ref 50.0–100.0)

## 2010-07-11 LAB — POCT I-STAT, CHEM 8
BUN: 16 mg/dL (ref 6–23)
Chloride: 100 mEq/L (ref 96–112)
Creatinine, Ser: 1 mg/dL (ref 0.4–1.5)
Hemoglobin: 17.3 g/dL — ABNORMAL HIGH (ref 13.0–17.0)
Potassium: 3.8 mEq/L (ref 3.5–5.1)
Sodium: 138 mEq/L (ref 135–145)

## 2010-07-12 LAB — PHOSPHORUS
Phosphorus: 3.4 mg/dL (ref 2.3–4.6)
Phosphorus: 3.5 mg/dL (ref 2.3–4.6)

## 2010-07-12 LAB — CBC
HCT: 35.2 % — ABNORMAL LOW (ref 39.0–52.0)
HCT: 36.8 % — ABNORMAL LOW (ref 39.0–52.0)
Platelets: 174 10*3/uL (ref 150–400)
Platelets: 164 10*3/uL (ref 150–400)
RBC: 3.81 MIL/uL — ABNORMAL LOW (ref 4.22–5.81)
RDW: 14.5 % (ref 11.5–15.5)
RDW: 15 % (ref 11.5–15.5)
WBC: 15.5 10*3/uL — ABNORMAL HIGH (ref 4.0–10.5)
WBC: 9.2 10*3/uL (ref 4.0–10.5)
WBC: 5.7 10*3/uL (ref 4.0–10.5)

## 2010-07-12 LAB — URINALYSIS, ROUTINE W REFLEX MICROSCOPIC
Ketones, ur: NEGATIVE mg/dL
Ketones, ur: NEGATIVE mg/dL
Nitrite: NEGATIVE
Nitrite: NEGATIVE
Specific Gravity, Urine: 1.005 (ref 1.005–1.030)
Specific Gravity, Urine: 1.031 — ABNORMAL HIGH (ref 1.005–1.030)
pH: 6 (ref 5.0–8.0)
pH: 7 (ref 5.0–8.0)

## 2010-07-12 LAB — COMPREHENSIVE METABOLIC PANEL
ALT: 23 U/L (ref 0–53)
AST: 23 U/L (ref 0–37)
AST: 28 U/L (ref 0–37)
Albumin: 3.1 g/dL — ABNORMAL LOW (ref 3.5–5.2)
Albumin: 3.4 g/dL — ABNORMAL LOW (ref 3.5–5.2)
Alkaline Phosphatase: 45 U/L (ref 39–117)
Alkaline Phosphatase: 53 U/L (ref 39–117)
BUN: 10 mg/dL (ref 6–23)
Chloride: 106 mEq/L (ref 96–112)
Glucose, Bld: 89 mg/dL (ref 70–99)
Potassium: 3.2 mEq/L — ABNORMAL LOW (ref 3.5–5.1)
Potassium: 3.2 mEq/L — ABNORMAL LOW (ref 3.5–5.1)
Sodium: 141 mEq/L (ref 135–145)
Total Bilirubin: 0.4 mg/dL (ref 0.3–1.2)
Total Protein: 5.6 g/dL — ABNORMAL LOW (ref 6.0–8.3)

## 2010-07-12 LAB — CARDIAC PANEL(CRET KIN+CKTOT+MB+TROPI)
CK, MB: 1.3 ng/mL (ref 0.3–4.0)
Relative Index: 0.9 (ref 0.0–2.5)
Total CK: 155 U/L (ref 7–232)
Troponin I: 0.01 ng/mL (ref 0.00–0.06)
Troponin I: 0.02 ng/mL (ref 0.00–0.06)

## 2010-07-12 LAB — VALPROIC ACID LEVEL
Valproic Acid Lvl: 43.3 ug/mL — ABNORMAL LOW (ref 50.0–100.0)
Valproic Acid Lvl: 57.1 ug/mL (ref 50.0–100.0)

## 2010-07-12 LAB — RAPID URINE DRUG SCREEN, HOSP PERFORMED
Amphetamines: NOT DETECTED
Barbiturates: NOT DETECTED
Opiates: NOT DETECTED

## 2010-07-12 LAB — DIFFERENTIAL
Basophils Absolute: 0 10*3/uL (ref 0.0–0.1)
Lymphocytes Relative: 13 % (ref 12–46)
Lymphocytes Relative: 62 % — ABNORMAL HIGH (ref 12–46)
Lymphs Abs: 2 10*3/uL (ref 0.7–4.0)
Lymphs Abs: 3.5 10*3/uL (ref 0.7–4.0)
Neutro Abs: 12.2 10*3/uL — ABNORMAL HIGH (ref 1.7–7.7)
Neutrophils Relative %: 79 % — ABNORMAL HIGH (ref 43–77)
Neutrophils Relative %: 28 % — ABNORMAL LOW (ref 43–77)

## 2010-07-12 LAB — POCT I-STAT, CHEM 8
Calcium, Ion: 1.14 mmol/L (ref 1.12–1.32)
Chloride: 104 mEq/L (ref 96–112)
Chloride: 107 mEq/L (ref 96–112)
HCT: 44 % (ref 39.0–52.0)
HCT: 38 % — ABNORMAL LOW (ref 39.0–52.0)
Potassium: 4.1 mEq/L (ref 3.5–5.1)
TCO2: 32 mmol/L (ref 0–100)

## 2010-07-12 LAB — PHENYTOIN LEVEL, TOTAL
Phenytoin Lvl: 25.6 ug/mL — ABNORMAL HIGH (ref 10.0–20.0)
Phenytoin Lvl: 25.7 ug/mL — ABNORMAL HIGH (ref 10.0–20.0)

## 2010-07-12 LAB — BASIC METABOLIC PANEL
BUN: 11 mg/dL (ref 6–23)
GFR calc Af Amer: >60 mL/min (ref >60)
GFR calc non Af Amer: >60 mL/min (ref >60)
Potassium: 3.6 mEq/L (ref 3.5–5.1)
Sodium: 142 mEq/L (ref 135–145)

## 2010-07-12 LAB — CK TOTAL AND CKMB (NOT AT ARMC): Relative Index: 1.2 (ref 0.0–2.5)

## 2010-07-12 LAB — MAGNESIUM: Magnesium: 1.8 mg/dL (ref 1.5–2.5)

## 2010-07-12 LAB — URINE CULTURE

## 2010-07-13 LAB — ACETAMINOPHEN LEVEL: Acetaminophen (Tylenol), Serum: <10 ug/mL — ABNORMAL LOW (ref 10–30)

## 2010-07-13 LAB — CBC
Hemoglobin: 13.8 g/dL (ref 13.0–17.0)
Hemoglobin: 13.4 g/dL (ref 13.0–17.0)
MCHC: 33.4 g/dL (ref 30.0–36.0)
MCHC: 32.4 g/dL (ref 30.0–36.0)
RBC: 4.48 MIL/uL (ref 4.22–5.81)
RDW: 14.8 % (ref 11.5–15.5)
WBC: 4.7 10*3/uL (ref 4.0–10.5)

## 2010-07-13 LAB — URINALYSIS, ROUTINE W REFLEX MICROSCOPIC
Glucose, UA: NEGATIVE mg/dL
Ketones, ur: NEGATIVE mg/dL
Protein, ur: NEGATIVE mg/dL
Protein, ur: NEGATIVE mg/dL
Urobilinogen, UA: 0.2 mg/dL (ref 0.0–1.0)
pH: 6.5 (ref 5.0–8.0)

## 2010-07-13 LAB — POCT I-STAT, CHEM 8
Calcium, Ion: 1.16 mmol/L (ref 1.12–1.32)
Creatinine, Ser: 0.7 mg/dL (ref 0.4–1.5)
Hemoglobin: 15 g/dL (ref 13.0–17.0)
Sodium: 143 mEq/L (ref 135–145)
TCO2: 27 mmol/L (ref 0–100)

## 2010-07-13 LAB — BASIC METABOLIC PANEL
CO2: 30 mEq/L (ref 19–32)
Calcium: 9.1 mg/dL (ref 8.4–10.5)
Chloride: 106 mEq/L (ref 96–112)
GFR calc Af Amer: >60 mL/min (ref >60)
Sodium: 143 mEq/L (ref 135–145)

## 2010-07-13 LAB — DIFFERENTIAL
Basophils Relative: 1 % (ref 0–1)
Eosinophils Relative: 1 % (ref 0–5)
Lymphocytes Relative: 49 % — ABNORMAL HIGH (ref 12–46)
Lymphs Abs: 2.4 10*3/uL (ref 0.7–4.0)
Monocytes Absolute: 0.4 10*3/uL (ref 0.1–1.0)
Monocytes Relative: 8 % (ref 3–12)
Monocytes Relative: 9 % (ref 3–12)
Neutro Abs: 1.8 10*3/uL (ref 1.7–7.7)
Neutrophils Relative %: 41 % — ABNORMAL LOW (ref 43–77)
Neutrophils Relative %: 38 % — ABNORMAL LOW (ref 43–77)

## 2010-07-13 LAB — RAPID URINE DRUG SCREEN, HOSP PERFORMED
Amphetamines: NOT DETECTED
Barbiturates: NOT DETECTED

## 2010-07-13 LAB — VALPROIC ACID LEVEL
Valproic Acid Lvl: 50.7 ug/mL (ref 50.0–100.0)
Valproic Acid Lvl: 108.8 ug/mL — ABNORMAL HIGH (ref 50.0–100.0)

## 2010-07-13 LAB — COMPREHENSIVE METABOLIC PANEL
BUN: 28 mg/dL — ABNORMAL HIGH (ref 6–23)
Calcium: 9.2 mg/dL (ref 8.4–10.5)
Creatinine, Ser: 0.8 mg/dL (ref 0.4–1.5)
Glucose, Bld: 89 mg/dL (ref 70–99)
Sodium: 142 mEq/L (ref 135–145)
Total Protein: 7.5 g/dL (ref 6.0–8.3)

## 2010-07-13 LAB — PHENYTOIN LEVEL, TOTAL: Phenytoin Lvl: 14.8 ug/mL (ref 10.0–20.0)

## 2010-07-13 LAB — TRICYCLICS SCREEN, URINE: TCA Scrn: NOT DETECTED

## 2010-07-13 LAB — SALICYLATE LEVEL: Salicylate Lvl: <4 mg/dL (ref 2.8–20.0)

## 2010-07-13 LAB — GLUCOSE, CAPILLARY: Glucose-Capillary: 80 mg/dL (ref 70–99)

## 2010-07-14 LAB — CBC
HCT: 36.5 % — ABNORMAL LOW (ref 39.0–52.0)
Hemoglobin: 12.1 g/dL — ABNORMAL LOW (ref 13.0–17.0)
MCHC: 33.3 g/dL (ref 30.0–36.0)
MCV: 89.8 fL (ref 78.0–100.0)
Platelets: 165 10*3/uL (ref 150–400)
Platelets: DECREASED 10*3/uL (ref 150–400)
RBC: 4.35 MIL/uL (ref 4.22–5.81)
WBC: 5.3 10*3/uL (ref 4.0–10.5)

## 2010-07-14 LAB — PHENYTOIN LEVEL, TOTAL
Phenytoin Lvl: 10.7 ug/mL (ref 10.0–20.0)
Phenytoin Lvl: 22.6 ug/mL — ABNORMAL HIGH (ref 10.0–20.0)
Phenytoin Lvl: 9.2 ug/mL — ABNORMAL LOW (ref 10.0–20.0)

## 2010-07-14 LAB — BASIC METABOLIC PANEL
BUN: 21 mg/dL (ref 6–23)
CO2: 30 mEq/L (ref 19–32)
Chloride: 103 mEq/L (ref 96–112)
Creatinine, Ser: 1.07 mg/dL (ref 0.4–1.5)

## 2010-07-14 LAB — DIFFERENTIAL
Basophils Relative: 1 % (ref 0–1)
Eosinophils Absolute: 0 10*3/uL (ref 0.0–0.7)
Monocytes Relative: 8 % (ref 3–12)
Neutrophils Relative %: 26 % — ABNORMAL LOW (ref 43–77)

## 2010-07-14 LAB — URINALYSIS, ROUTINE W REFLEX MICROSCOPIC
Glucose, UA: NEGATIVE mg/dL
Hgb urine dipstick: NEGATIVE
Specific Gravity, Urine: 1.036 — ABNORMAL HIGH (ref 1.005–1.030)

## 2010-07-14 LAB — POCT I-STAT, CHEM 8
BUN: 26 mg/dL — ABNORMAL HIGH (ref 6–23)
Calcium, Ion: 1.14 mmol/L (ref 1.12–1.32)
Creatinine, Ser: 1 mg/dL (ref 0.4–1.5)
TCO2: 31 mmol/L (ref 0–100)

## 2010-07-14 LAB — COMPREHENSIVE METABOLIC PANEL
ALT: 14 U/L (ref 0–53)
Albumin: 3.1 g/dL — ABNORMAL LOW (ref 3.5–5.2)
Alkaline Phosphatase: 47 U/L (ref 39–117)
BUN: 13 mg/dL (ref 6–23)
Chloride: 108 mEq/L (ref 96–112)
Glucose, Bld: 75 mg/dL (ref 70–99)
Potassium: 3.9 mEq/L (ref 3.5–5.1)
Sodium: 142 mEq/L (ref 135–145)
Total Bilirubin: 0.4 mg/dL (ref 0.3–1.2)
Total Protein: 5.5 g/dL — ABNORMAL LOW (ref 6.0–8.3)

## 2010-07-14 LAB — VITAMIN B12: Vitamin B-12: 524 pg/mL (ref 211–911)

## 2010-07-14 LAB — ALBUMIN: Albumin: 3.2 g/dL — ABNORMAL LOW (ref 3.5–5.2)

## 2010-07-14 LAB — VALPROIC ACID LEVEL
Valproic Acid Lvl: 58.2 ug/mL (ref 50.0–100.0)
Valproic Acid Lvl: 98.8 ug/mL (ref 50.0–100.0)

## 2010-08-01 ENCOUNTER — Emergency Department (HOSPITAL_COMMUNITY)
Admission: EM | Admit: 2010-08-01 | Discharge: 2010-08-01 | Disposition: A | Discharge status: Home / Self Care | Payer: PRIVATE HEALTH INSURANCE | Attending: Emergency Medicine | Admitting: Emergency Medicine

## 2010-08-01 DIAGNOSIS — G40909 Epilepsy, unspecified, not intractable, without status epilepticus: Secondary | ICD-10-CM | POA: Insufficient documentation

## 2010-08-01 DIAGNOSIS — Z79899 Other long term (current) drug therapy: Secondary | ICD-10-CM | POA: Insufficient documentation

## 2010-08-01 DIAGNOSIS — R404 Transient alteration of awareness: Secondary | ICD-10-CM | POA: Insufficient documentation

## 2010-08-01 LAB — PHENYTOIN LEVEL, TOTAL: Phenytoin Lvl: 25.2 ug/mL — ABNORMAL HIGH (ref 10.0–20.0)

## 2010-08-01 LAB — VALPROIC ACID LEVEL: Valproic Acid Lvl: 89.2 ug/mL (ref 50.0–100.0)

## 2010-08-04 LAB — GLUCOSE, CAPILLARY: Glucose-Capillary: 81 mg/dL (ref 70–99)

## 2010-08-12 LAB — POCT I-STAT, CHEM 8
Calcium, Ion: 1.02 mmol/L — ABNORMAL LOW (ref 1.12–1.32)
Creatinine, Ser: 1.2 mg/dL (ref 0.4–1.5)
Glucose, Bld: 78 mg/dL (ref 70–99)
HCT: 51 % (ref 39.0–52.0)
Hemoglobin: 17.3 g/dL — ABNORMAL HIGH (ref 13.0–17.0)
Potassium: 4.6 mEq/L (ref 3.5–5.1)
TCO2: 28 mmol/L (ref 0–100)

## 2010-08-12 LAB — URINALYSIS, ROUTINE W REFLEX MICROSCOPIC
Bilirubin Urine: NEGATIVE
Hgb urine dipstick: NEGATIVE
Ketones, ur: 15 mg/dL — AB
Nitrite: NEGATIVE
Protein, ur: NEGATIVE mg/dL
Specific Gravity, Urine: 1.041 — ABNORMAL HIGH (ref 1.005–1.030)
Urobilinogen, UA: 0.2 mg/dL (ref 0.0–1.0)

## 2010-08-27 ENCOUNTER — Emergency Department (HOSPITAL_COMMUNITY)
Admission: EM | Admit: 2010-08-27 | Discharge: 2010-08-27 | Disposition: A | Discharge status: Home / Self Care | Payer: PRIVATE HEALTH INSURANCE | Attending: Emergency Medicine | Admitting: Emergency Medicine

## 2010-08-27 DIAGNOSIS — H5340 Unspecified visual field defects: Secondary | ICD-10-CM | POA: Insufficient documentation

## 2010-08-27 DIAGNOSIS — F1010 Alcohol abuse, uncomplicated: Secondary | ICD-10-CM | POA: Insufficient documentation

## 2010-08-27 DIAGNOSIS — Z79899 Other long term (current) drug therapy: Secondary | ICD-10-CM | POA: Insufficient documentation

## 2010-08-27 DIAGNOSIS — G40909 Epilepsy, unspecified, not intractable, without status epilepticus: Secondary | ICD-10-CM | POA: Insufficient documentation

## 2010-08-27 LAB — BASIC METABOLIC PANEL
BUN: 26 mg/dL — ABNORMAL HIGH (ref 6–23)
CO2: 25 mEq/L (ref 19–32)
Chloride: 104 mEq/L (ref 96–112)
Creatinine, Ser: 1.02 mg/dL (ref 0.4–1.5)
Potassium: 4.4 mEq/L (ref 3.5–5.1)

## 2010-08-27 LAB — VALPROIC ACID LEVEL: Valproic Acid Lvl: 140.6 ug/mL — ABNORMAL HIGH (ref 50.0–100.0)

## 2010-09-09 NOTE — Discharge Summary (Signed)
[name], [name]                 ACCOUNT NO.:  [id number]   MEDICAL RECORD NO.:  [id number]          PATIENT TYPE:  INP   LOCATION:  6703                         FACILITY:  [facility]   PHYSICIAN:  Herbie Saxon, MDDATE OF BIRTH:  1956/01/29   DATE OF ADMISSION:  02/23/2008  DATE OF DISCHARGE:  02/29/2008                               DISCHARGE SUMMARY   DISCHARGE DIAGNOSES:  1. Left lung abscess.  2. Alcohol abuse.  3. Cocaine abuse.  4. History of seizure.  5. History of eye trauma.  6. History of tobacco abuse.  7. History of coronary artery disease.  8. Blindness, right eye.  9. Left upper lobe pneumonia.   CONSULTATIONS:  Arn Medal, MD, Interventional Radiology.   PROCEDURES:  The patient had a CT-guided left lung biopsy performed on  February 27, 2008, by Interventional Radiology, Dr. Lowella Dandy.  Chest x-ray  of February 27, 2008, showed no pneumothorax status post left lung  biopsy.  The chest x-ray of February 24, 2008, shows right upper  extremity PICC line.  There is a stable left upper lobe mass.   HOSPITAL COURSE:  This 55 year old African American male presented to  the emergency room complaining of pleuritic chest pain of 1-day  duration.  The chest pain was on the left side with associated dyspnea.  On presentation, the CT chest was suggestive of apical posterior segment  left upper lobe mass with suggestion of abscess versus necrotic  neoplasm.  The biopsy report did show that the stains for AFB, fungi,  Cryptococcus, and Pneumocystis were negative.  In addition to an area of  necrosis with acute inflammatory cells consistent with abscess, the  adjacent lung parenchyma shows organizing pneumonia.  The patient was  started on IV clindamycin and clinically has improved.  There was no  shortness of breath and pleuritic chest pain has been much ameliorated.   DISCHARGE CONDITION:  Stable.   DIET:  Heart-healthy.   FOLLOWUP:  He is to follow up with the  primary care physician at  [facility] in the next 5-7 days, has a repeat chest x-ray or CT chest  in the next 3-4 weeks.   MEDICATIONS ON DISCHARGE:  1. Avelox 400 mg daily for 3 weeks.  2. Clindamycin 600 mg t.i.d. for 3 more weeks.  This could be reviewed      for further prolongation after seeing the primary care physician in      the next 3 weeks.  The patient could have a total of up to 4-6      weeks of antibiotic coverage as needed.  3. Ultracet 2 tablets q.6 h. p.r.n.  4. Thiamine 100 mg daily.  5. Folic acid 1 mg daily.  6. Vitamin B12 1000 mcg daily.  7. Continue with his home medication of Dilantin 100 mg t.i.d. and      Depakote 1000 mg t.i.d.   PHYSICAL EXAMINATION:  GENERAL:  He is a middle-aged man, not in acute  distress.  VITAL SIGNS:  Temperature 98, pulse 73, respiratory rate is 18, and  blood pressure 126/76.  HEENT:  Blind in right eye.  Mucous membranes are moist.  Head is  atraumatic and normocephalic.  CHEST:  He has coarse breath sounds in the left apex.  CARDIAC:  Heart sounds 1 and 2, regular rate and rhythm.  ABDOMEN:  Benign.  EXTREMITIES:  No edema.  Peripheral pulses present.  NEUROLOGIC:  He is alert and oriented x3.  Mood is stable.  No  neurologic deficits.   LABORATORY DATA:  CBC of February 27, 2008, shows WBC 9.6, hematocrit  36.8, and platelet count 263.  Chemistry of February 27, 2008, shows  sodium is 140, potassium 3.8, chloride 105, bicarbonate 29, glucose 99,  BUN 14, and creatinine is 0.8.  Abscess culture negative for AFB, no  organisms seen at present.  Note again the primary care physician to  review further antibiotic coverage after completing this initial 4 weeks  of antibiotics.      Herbie Saxon, MD  Electronically Signed     MIO/MEDQ  D:  02/29/2008  T:  03/01/2008  Job:  [id number]

## 2010-09-09 NOTE — H&P (Signed)
[name], [name]                 ACCOUNT NO.:  [id number]   MEDICAL RECORD NO.:  [id number]          PATIENT TYPE:  EMS   LOCATION:  MAJO                         FACILITY:  [facility]   PHYSICIAN:  Ladell Pier, M.D.   DATE OF BIRTH:  07-13-1955   DATE OF ADMISSION:  02/23/2008  DATE OF DISCHARGE:                              HISTORY & PHYSICAL   CHIEF COMPLAINT:  Chest pain.   The patient is a 55 year old African American male that presented to the  emergency room with chest pain.  The patient stated that the pain  started last night while he was at home.  He stated he started to cough  and the cough is dry, and whenever he takes a deep breath it really  hurts.  He states that whenever he moves it hurts.  Even turning in bed  was painful in the chest area, more so on the left.  He had no increased  shortness of breath, no nausea or vomiting.   PAST MEDICAL HISTORY:  1. Seizure disorder.  He said he has had epilepsy since 55 years old.  2. Alcohol abuse.  3. Polysubstance abuse.  4. 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   FAMILY HISTORY:  Mother died, she had hypertension.  Father died in a  car crash.   SOCIAL HISTORY:  The patient is single, he has two children.  He is  unemployed, he is on disability.  He smokes about one pack of cigarettes  in two weeks.  He states that he does not drink everyday, but he drinks  quite a bit.  He would not say how much.   MEDICATIONS:  1. Dilantin 100 mg three times a day.  2. Depakote 500 mg 2 three times a day.   ALLERGIES:  NO KNOWN DRUG ALLERGIES.   REVIEW OF SYSTEMS:  Negative, otherwise stated in the HPI.   PHYSICAL EXAMINATION:  VITAL SIGNS:  Temperature 98.9, pulse 83,  respirations 20, blood pressure 103/69.  Pulse ox 98% on room air.  GENERAL:  The patient is sitting up on a stretcher, does not seem to be  in any acute distress.  HEENT:  Head is normocephalic, atraumatic.  Pupils reactive to light.  Throat  without erythema.  He does have poor dentition.  CARDIOVASCULAR:  Regular rate and rhythm.  LUNGS:  He has decreased breath sounds, especially in the lower lobes  and in the upper lobe area.  ABDOMEN:  Soft, nontender, nondistended.  Positive bowel sounds.  EXTREMITIES:  No edema.   LABORATORY DATA:  Sodium 141, potassium 4.1, chloride 108, BUN 21,  creatinine 1.1, glucose 82, hemoglobin 13.3, hematocrit 39.  X-ray of  the chest with new left lung mass, highly suspicious for malignancy.  Focal pneumonia less likely.  CT of the chest with apical posterior  segment left upper lobe mass measuring 4.5 x 3.1 with central low  attenuation suggesting either abscess or a necrotic neoplasm.   ASSESSMENT/PLAN:  1. Lung mass.  2. Seizure disorder.  3. Polysubstance abuse.   Will admit the patient to the  hospital.  Will start him on clindamycin.  Will try to induce sputum.  He will need a CVTS consult in the morning  for possible biopsy of that area.  Will try to get a CT scan with  contrast to see if that gives any more information.  Will continue him  on all his home medications.      Ladell Pier, M.D.  Electronically Signed     NJ/MEDQ  D:  02/23/2008  T:  02/23/2008  Job:  [id number]

## 2010-09-09 NOTE — Group Therapy Note (Signed)
[name], [name]                 ACCOUNT NO.:  [id number]   MEDICAL RECORD NO.:  [id number]          PATIENT TYPE:  INP   LOCATION:  6703                         FACILITY:  [facility]   PHYSICIAN:  Hillery Aldo, M.D.   DATE OF BIRTH:  03-11-1956                                 PROGRESS NOTE   DATE OF DISCHARGE:  Pending.   PRIMARY CARE PHYSICIAN:  Dr. Reche Dixon a [facility].   DISCHARGE DIAGNOSES:  1. Left upper lobe lung mass/lung abscess, pathology pending.  2. Alcohol dependency.  3. Cocaine abuse.  4. Seizure disorder.  5. History of eye trauma in the past.  6. Coronary artery atherosclerosis.  7. Cholelithiasis.  8. Chest x-ray on February 24, 2008 showed right status post placement      of peripherally inserted central catheter showing the catheter tip      in the lower SVC and a stable left upper lobe mass.   DISCHARGE MEDICATIONS:  Be dictated at time of discharge.   CONSULTATIONS:  Dr. Lowella Dandy of interventional radiology.   BRIEF ADMISSION AND HISTORY OF PRESENT ILLNESS:  The patient is a 55 year old male who presented to the hospital with a  chief complaint of chest pain accompanied by a dry cough.  The chest  pain was pleuritic and prompted an evaluation in the emergency  department which revealed a left upper lobe lung mass and therefore the  patient was admitted for further evaluation and workup.  For full  details, please see the dictated report done by Dr. Olena Leatherwood.   PROCEDURES AND DIAGNOSTIC STUDIES:  1. Chest x-ray on February 23, 2008 showed a new left lung mass, highly      suspicious for malignancy.  Focal pneumonia was considered less      likely.  No evidence of adenopathy.  2. CT scan of chest on February 23, 2008 showed an apical posterior      segment left upper lobe mass measuring 4.5 x 3.1 cm which central      low attenuation suggesting either abscess or necrotic neoplasm.      There is coronary artery atherosclerosis and cholelithiasis.  3.  Percutaneous CT-guided biopsy on February 27, 2008 done by Dr. Paulina Fusi.      Biopsy results pending at the time of this dictation.  She  4. Chest x-ray on February 27, 2008 showed no pneumothorax status post      left lung biopsy.   DISCHARGE LABORATORY VALUES:  Dictated at the time of discharge.   HOSPITAL COURSE BY PROBLEM:  1. Pleuritic chest pain secondary to left upper lobe mass/lung      abscess:  The patient was admitted and empirically put on      clindamycin.  His symptoms dramatically improved with no further      complaints of pleuritic chest pain after approximately 48 hours of      treatment.  A percutaneous biopsy has been accomplished and, at the      time of this dictation, pathology report is still pending.  At this      time, we are converting the  patient's clindamycin over to the p.o.      route with plans to discharge him once the final pathology report      is known and a treatment plan can be outlined.  2. Alcohol dependency:  The patient has a history of heavy alcohol      use.  Supplemented with thiamine and folic acid but did not display      any signs of acute alcohol withdrawal.  3. Cocaine abuse.  The patient did have a urine drug screen done which      was positive for cocaine.  He was counseled appropriately and at      this time declines assistance with community resources.  4. Seizure disorder.  The patient has been maintained on Depakote      without any active seizure events.   DISPOSITION:  The patient will be discharged home once the final pathology report is  known and a treatment plan can be outlined.  A discharge summary  addendum will be dictated time of discharge.      Hillery Aldo, M.D.  Electronically Signed     CR/MEDQ  D:  02/28/2008  T:  02/29/2008  Job:  [id number]   cc:   Dineen Kid. Reche Dixon, M.D.  Fax: [phone]

## 2010-09-12 NOTE — H&P (Signed)
[name], [name]                 ACCOUNT NO.:  [id number]   MEDICAL RECORD NO.:  [id number]          PATIENT TYPE:  INP   LOCATION:  0104                         FACILITY:  [facility]   PHYSICIAN:  Della Goo, M.D. DATE OF BIRTH:  1955-10-22   DATE OF ADMISSION:  07/18/2006  DATE OF DISCHARGE:                              HISTORY & PHYSICAL   CHIEF COMPLAINT:  Seizures.   HISTORY OF PRESENT ILLNESS:  This is a 55 year old male with a history  of seizures who presented to the emergency department secondary to  return of seizures.  He was recently hospitalized with Dilantin toxicity  on July 10, 2006, and was recently discharged with adjustment in his  medications.  He was evaluated in the emergency department on return,  and laboratory studies revealed subtherapeutic levels.  The patient was  initially to be discharged with oral therapy.  However, the patient  seized before being able to leave and was referred for admission.  History is per the medical records of the ER physician and the previous  medical records.  Please refer to patient's recent hospitalization on  July 10, 2006, to the [facility], admitted by Dr. Jamison Oka.   The patient's Dilantin level in the emergency department today was found  to be 4.9, and his Depakote level was found to 27.0.   PAST MEDICAL HISTORY:  Significant for seizure disorder/epilepsy,  polysubstance abuse, alcohol abuse and right eye prosthesis secondary to  trauma.   MEDICATIONS:  1. Dilantin 125 mg liquid t.i.d.  2. Depakote 500 mg t.i.d.   ALLERGIES:  No known drug allergies.   SOCIAL HISTORY:  Positive alcohol.  Positive illicit drug usage,  cocaine.   FAMILY HISTORY:  Unable to obtain from the patient at this time since he  is postictal.  History per the medical records is that both parents are  deceased.  Mother had hypertension.   PHYSICAL EXAMINATION:  GENERAL APPEARANCE:  This is a 55 year old thin  male  who is currently sedated and obtunded in no acute distress  currently.  VITAL SIGNS:  Temperature 97.8, blood pressure 105/81, heart rate 93,  respirations 22.  O2 saturation is 96% on room air.  HEENT:  Normocephalic, atraumatic.  The left pupil is reactive to light  but sluggish.  Right eye with roving eye movement at this time.  Oropharynx with dry mucosa, no exudates.  A small tongue laceration,  lateral right tongue area.  NECK:  Supple.  No thyromegaly, adenopathy or jugular venous distention.  CARDIOVASCULAR:  Regular rate and rhythm.  No murmurs, gallops or rubs.  LUNGS:  Clear to auscultation bilaterally.  ABDOMEN:  Positive bowel sounds.  Soft, nontender and nondistended.  Scaphoid.  EXTREMITIES:  No cyanosis, clubbing or edema.  NEUROLOGIC:  The patient is sedated.  However, he does have full range  of motion and is able to move all 4 of his extremities.  He is not  cooperating with commands currently as he is unable to cooperate with  commands currently.   LABORATORY STUDIES:  Sodium level of 145, potassium  3.5, chloride 109,  CO2 23, BUN 17, creatinine 0.9, glucose 127, calcium level 9.1.  Dilantin level 4.9.  Valproic acid level 27.0.   ASSESSMENT:  A 55 year old male being admitted with seizures and  subtherapeutic medication levels.   DIAGNOSES:  1. Seizures.  2. Polysubstance abuse.  3. Alcohol history.  4. History of noncompliance.   PLAN:  The patient will be admitted to an area for cardiac monitoring.  He will be made n.p.o. and placed on seizure and fall precautions as  well as aspiration precautions.  He will be reloaded with IV Dilantin,  and his medications will be further adjusted.  Ativan IV has been  written p.r.n. seizure activity or agitation q.4h. p.r.n.  The patient  will also be started on IV Depakote while he is obtunded and unable to  take p.o.  A urine drug screen and alcohol level have been ordered.      Della Goo, M.D.   Electronically Signed     HJ/MEDQ  D:  07/18/2006  T:  07/18/2006  Job:  [id number]

## 2010-09-12 NOTE — H&P (Signed)
[name], [name]                 ACCOUNT NO.:  [id number]   MEDICAL RECORD NO.:  [id number]          PATIENT TYPE:  EMS   LOCATION:  MINO                         FACILITY:  [facility]   PHYSICIAN:  Mobolaji B. Bakare, M.D.DATE OF BIRTH:  06-Mar-1956   DATE OF ADMISSION:  07/10/2006  DATE OF DISCHARGE:                              HISTORY & PHYSICAL   PRIMARY CARE PHYSICIAN:  [facility].   CHIEF COMPLAINT:  Unsteady gait.   HISTORY OF PRESENTING COMPLAINT:  [name] is a 55 year old African-  American male with a history of seizure disorder.  He has been on  Depakote and Dilantin.  There have not been any recent changes in the  last two months.  He presented to the emergency room with a couple-day  history of feeling wobbly on his feet without the ability to maintain  his balance.  He feels like he is drunk, staggering.  There has been no  fall or loss of consciousness.  The patient came to the emergency room  for evaluation.   His initial laboratory data revealed a Dilantin level of 32.3.  His  Depakote level was subtherapeutic at 28.8.  The patient apparently has  been drinking out of his Dilantin bottle.  He uses a liquid Dilantin.  He is supposed to be on 125 mg 3 times a day, but he does not use a  tablespoon, he drinks directly from the Dilantin bottle.  He quit using  his Dilantin two days ago when he started getting sick.   REVIEW OF SYSTEMS:  Denies fever, chills.  No change in his vision.  He  is legally blind on the right eye.  He has a frontal headache, which is  not persistent.  No nausea, vomiting, abdominal pain, or diarrhea.  He  has no cough, shortness of breath, or chest pain.   PAST MEDICAL HISTORY:  1. Seizure disorder.  2. Alcohol abuse.  3. Polysubstance abuse.  4. 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   CURRENT MEDICATIONS:  1. Dilantin 125 mg.  He is supposed to use 3 times a day.  Patient      drinks out of the bottle in the morning and  evening.  2. Depakote 500 mg t.i.d.   ALLERGIES:  No known drug allergies.   FAMILY HISTORY:  Both parents are deceased.  Mother had hypertension.  Father passed away from a car crash.   SOCIAL HISTORY:  Patient is single.  He has two children.  He is  currently unemployed.  He is on [employer].  He smokes cigarettes,  about one pack in a week.  He drinks alcohol.  He can finish two six  packs at a sitting.  He does not drink every day.   PHYSICAL EXAMINATION:  INITIAL VITALS:  Temperature 97.8, blood pressure  148/94, pulse 72, respiratory rate 18.  O2 sats 98% on room air.  GENERAL:  Patient is awake, alert and oriented to time, place, and  person.  HEENT:  Normocephalic and atraumatic head.  There is no nystagmus.  NECK:  No  elevated JVD.  No carotid bruits.  LUNGS:  Clear clinically to auscultation.  No wheeze or rhonchi.  CVS:  S1 and S2 regular.  No murmur.  ABDOMEN:  Nondistended.  Soft, nontender.  Bowel sounds present.  EXTREMITIES:  No pedal edema or calf tenderness.  Dorsalis pedis pulses  palpable bilaterally.  CNS:  No focal neurological deficits.   INITIAL LABORATORY DATA:  Valproic acid level 28.8.  Alcohol level less  than 5.  Dilantin 32.3.  White count 5.1, hemoglobin 13.9, hematocrit  40.5, platelets 209.  Sodium 157, potassium 3.9, chloride 104, glucose  104, BUN 10, bicarb 28, creatinine 11.9.  Urine drug screen positive for  cocaine.   ASSESSMENT/PLAN:  [name] is a 55 year old African-American male who  has been noncompliant with his medication.  He presented today with  unsteady gait.  Dilantin level is elevated.   ADMISSION DIAGNOSES:  1. Dilantin toxicity.  2. Polysubstance abuse/cocaine.  3. Alcohol abuse.  4. Tobacco abuse.  5. Seizure disorder with subtherapeutic Depakote level and Dilantin      toxicity.   PLAN:  Will admit to telemetry floor.  Hold Dilantin.  IV fluids with  normal saline at 125 cc/hr.  Check liver function tests.   Will ask  pharmacy to dose Depakote.  Will place on seizure precautions.  Patient  will be offered to have substance abuse and tobacco cessation  counseling.  Nicotine patch 14 mg daily.  Thiamine 100 mg daily.  Multivitamin 1 daily.  Folic acid 1 daily.      Mobolaji B. Corky Downs, M.D.  Electronically Signed     MBB/MEDQ  D:  07/10/2006  T:  07/10/2006  Job:  [id number]

## 2010-09-12 NOTE — H&P (Signed)
[name], [place]                 ACCOUNT NO.:  [id number]   MEDICAL RECORD NO.:  [id number]          PATIENT TYPE:  OBV   LOCATION:  0102                         FACILITY:  [facility]   PHYSICIAN:  Melissa L. Ladona Ridgel, MD  DATE OF BIRTH:  [birth year]   DATE OF ADMISSION:  04/08/2005  DATE OF DISCHARGE:                                HISTORY & PHYSICAL   CHIEF COMPLAINT:  Seizure.   PRIMARY CARE PHYSICIAN:  [facility].   HISTORY OF PRESENT ILLNESS:  The patient is a 55 year old African-American  male with past medical history for seizure disorder.  The patient states  that for the last day he felt shaky, unable to get up on his feet.  He  states that this is usually how he feels prior to having a seizure come on.  The patient states that he came to the emergency room for further evaluation  when he noted that these were his symptoms.  While in the emergency room,  the patient was noted to have subtherapeutic Depakote level.  He, therefore,  had a loading dose of Depakote and was prepared for discharge from the ED.  At the time of planned discharge, the patient developed seizure activity.  Therefore, we have to referred to admit the patient to observation.   REVIEW OF SYSTEMS:  The only positive finding was a chill last night,  although he has had no fevers.  He had some changes in his vision which is  usually his prodrome to seizure activity and weakness.  He denied any chest  pain, shortness of breath, and all other Review of Systems negative.   PAST MEDICAL HISTORY:  He states is none except for seizure disorder.   PAST SURGICAL HISTORY:  Denies any.   SOCIAL HISTORY:  Less than a pack a day cigarette smoker.  He has occasional  alcohol, and he does smoke crack; the last dose was 4 days ago.   FAMILY HISTORY:  Mom and dad are both deceased.  Mom had hypertension. Dad  was killed in a car wreck.   ALLERGIES:  No known drug allergies.   MEDICATIONS:  1.  Depakote 250 mg p.o. 3  times a day.  2.  Dilantin 100 mg p.o. 3 times a day.   PHYSICAL EXAMINATION:  VITAL SIGNS:  Temperature 98, blood pressure 122/78,  pulse 78, respirations 16, O2 saturation 99%.  GENERAL: He is a little bit groggy but appropriate.  HEENT:  Normocephalic and atraumatic.  Pupils equal, round, and reactive to  light.  He does have blindness in the right eye.  Extraocular muscles are  intact.  Mucous membranes are moist.  NECK:  Supple.  There is no JVD, no lymph nodes, no carotid bruits.  CHEST: Clear to auscultation with no rhonchi, rales, or wheezes.  CARDIOVASCULAR:  Regular rate and rhythm.  Positive S1 and S2.  No S3 or S4.  ABDOMEN:  Soft, nontender, nondistended with positive bowel sounds.  EXTREMITIES:  No clubbing, cyanosis, or edema.  NEUROLOGIC:  Awake, alert, oriented.  Cranial nerves II-XII intact.  Power  is 5/5.  LABORATORY DATA:  Sodium 138, potassium 3.9, chloride 102, CO2 31, BUN 16,  creatinine 0.9, glucose 84.  Dilantin level was 22.7 which is slightly  elevated. Valproic acid was less than 10.   ASSESSMENT AND PLAN:  This is a 55 year old African-American male with  seizure disorder and substance abuse who presented with sense of oncoming  seizure activity.  The patient was dosed with valproic acid, and upon  discharging from the emergency room developed seizure activity.  1.  Neurology: Seizure disorder with subtherapeutic medications. Will      continue Depakote and Dilantin and recheck a Dilantin level in the      morning as was slightly elevated.  Place him on seizure precautions.  2.  Cardiovascular:  Currently stable with no history of current issues.  3.  Pulmonary: No complaint or clinical finding warranting any further      evaluation.  4.  Gastrointestinal: No complaint or issues.  Can have a regular diet.  5.  Genitourinary: We will check a UA, C&S.  The patient does admit to crack      use.  We will check a urine drug screen to make sure there are no  other      substances that we need to be aware of.  6.  Endocrine: He has decreased blood sugars.  Will, therefore, check x1 and      provide him with a diet.  7.  Twenty-four-hour observation: At this time, I do not feel that he needs      any deep vein thrombosis prophylaxis; he can ambulate ad lib.      Melissa L. Ladona Ridgel, MD  Electronically Signed     MLT/MEDQ  D:  04/08/2005  T:  04/08/2005  Job:  [id number]   cc:   Dala Dock
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
## 2010-09-12 NOTE — Discharge Summary (Signed)
[name], [name]                 ACCOUNT NO.:  [id number]   MEDICAL RECORD NO.:  [id number]          PATIENT TYPE:  INP   LOCATION:  3034                         FACILITY:  [facility]   PHYSICIAN:  Melissa L. Ladona Ridgel, MD  DATE OF BIRTH:  1955-08-03   DATE OF ADMISSION:  04/08/2005  DATE OF DISCHARGE:  04/10/2005                                 DISCHARGE SUMMARY   CHIEF COMPLAINT ON ADMISSION:  Seizure activity.   DISCHARGE DIAGNOSES:  1.  Seizure secondary to noncompliance with medications.  Patient was      restarted on his Dilantin and Depakote.  He initially suffered several      active witnessed seizures but by the time of discharge was clinically      stable.  He was provided with prescriptions and follow up to [facility] for his medications.  2.  Polysubstance abuse.  The patient was found to have positive urine drug      screen for cocaine and was counseled regarding this.  He also smokes      tobacco which was also addressed during his hospital stay.  3.  Medical noncompliance.  The patient was seen by our care management      department and encouraged to follow up with [facility] and remain      compliant with his medications.   HISTORY AND HOSPITAL COURSE:  The patient is a 54 year old African-American  male with a known seizure disorder.  He has no other medical problems.  He  presented to the emergency room with the feeling that he was going to have a  seizure.  In the emergency room the patient was observed for some time.  He  was noted to have a subtherapeutic Depakote level and received a Depakote  load.  He was in the process of being discharged when he had a witnessed  seizure.  The patient was brought back into the emergency room and [name] was consulted for his care.  The patient was admitted for  observation and re-titration of his medications.  En route to [facility] where he was being transferred for admission, the patient  again  experienced seizure activity which was treated with Ativan with good  results.  The patient remained post-ictal with symptoms of confusion and  lethargy for approximately 24 to 48 hours.  He was, however, able to remain  seizure-free thereafter and was thus found stable for discharge on the day  of discharge by my partner, Dr. Jasmine Pang.  Dr. Jomarie Longs reports his  physical examination and vital signs stable.  Examination unchanged.   DISCHARGE MEDICATIONS:  He was discharged to home on the following  medications.  1.  Dilantin 100 mg p.o. b.i.d.  2.  Depakote 500 mg p.o. t.i.d. X2 days and then 250 mg p.o. b.i.d.   FOLLOW UP:  Patient was given a referral to [facility] for further follow  up and measurement of therapeutic drug levels.   DISPOSITION:  At the time of discharge the patient was deemed  stable for  discharge.      Melissa L. Ladona Ridgel, MD  Electronically Signed     MLT/MEDQ  D:  05/26/2005  T:  05/26/2005  Job:  [id number]   cc:   [facility]

## 2010-09-12 NOTE — Discharge Summary (Signed)
[name], [name]                 ACCOUNT NO.:  [id number]   MEDICAL RECORD NO.:  [id number]          PATIENT TYPE:  EMS   LOCATION:  ED                           FACILITY:  [facility]   PHYSICIAN:  Theone Stanley, MD   DATE OF BIRTH:  11-27-1955   DATE OF ADMISSION:  04/08/2005  DATE OF DISCHARGE:  04/08/2005                                 DISCHARGE SUMMARY   ADMISSION DIAGNOSES:  1.  Seizures.  2.  Cocaine abuse.  3.  Tobacco abuse.   DISCHARGE DIAGNOSES:  1.  Seizures.  2.  Cocaine abuse.  3.  Tobacco abuse.   CONSULTATIONS:  None.   PROCEDURES:  None.   HISTORY OF PRESENT ILLNESS:  [name] is a 55 year old African-American  gentleman with a past medical history of seizures.  Apparently, the patient,  according to him, ran out of his medication; therefore, he came to the ER  and stated that he felt he was going to have a seizure.  He was given some  IV medication.  It is felt that he could be discharged; however, he had a  seizure.  He was admitted at that point in time and given a loading dose of  Depakote.  It was noted that he was subtherapeutic on his Depakote and  mildly toxic on his Dilantin levels.  He had a subsequent second seizure  here in the hospital.  He was given Ativan.  He was continued on p.o.  Dilantin and p.o. Depakote.  His Dilantin was checked again, and he was at a  therapeutic level.  He was restarted on Dilantin 100 b.i.d. and continued on  Depakote 500 q.8h.  He no longer had any seizures, and it was felt it was  safe for him to be discharged.  He was discharged in stable condition.  He  was consulted in regards to stopping his tobacco use and cocaine use.  I  indicated to him that if he continues with the cocaine, this will  precipitate one of his seizures.   DISCHARGE MEDICATIONS:  1.  Dilantin 100 mg 1 p.o. b.i.d.  2.  Depakote 500 mg t.i.d. for the next 3 days, and then down to his normal      dose of 250 mg 1 p.o. t.i.d.   He is to  follow up at [facility] in 3-5 days.  He is to get a Dilantin and  Depakote level at that point in time.      Theone Stanley, MD  Electronically Signed     AEJ/MEDQ  D:  04/10/2005  T:  04/13/2005  Job:  [id number]

## 2010-09-12 NOTE — Discharge Summary (Signed)
[name], [name]                 ACCOUNT NO.:  [id number]   MEDICAL RECORD NO.:  [id number]        PATIENT TYPE:  LINP   LOCATION:                               [facility]ACILITY:  [facility]   PHYSICIAN:  Herbie Saxon, MDDATE OF BIRTH:  1955/10/11   DATE OF ADMISSION:  07/18/2006  DATE OF DISCHARGE:  07/21/2006                               DISCHARGE SUMMARY   DISCHARGE DIAGNOSES:  1. Seizure.  2. Alcohol abuse.  3. Cocaine abuse.  4. Hypertension.   HOSPITAL COURSE:  This 55 year old African-American male with history of  seizures presented in the emergency room secondary to tonic clonic  seizures. His Dilantin level was subtherapeutic at presentation at 4.9.  Alcohol level was 207. The patient also abuses cocaine in addition to  alcohol. The patient was loaded with Dilantin and given Ativan IV p.r.n.  He remains seizure free. Dilantin level has been optimized .  Hypertension has been controlled with Norvasc. Patient feels better and  is not in any withdrawal. He is declining detox as outpatient despite  adequate counsel.  He had hypokalemia, which has been supplemented. He  has been discharged home in stable condition to follow up with primary  care physician, Dr. Nyra Capes at [facility] in a week. He is to follow up  with Dr. Sandria Manly, Neurology, as an outpatient in 4-6 weeks.   DISCHARGE CONDITION:  Stable.   DIET:  Heart healthy, low sodium.   ACTIVITY:  The patient is to avoid any driving.   MEDICATIONS ON DISCHARGE:  1. Prilosec 20 mg daily.  2. Dilantin 200 mg q.h.s.  3. Amlodipine 5 mg daily.  4. Thiamine 100 mg daily.  5. Librium 25 mg b.i.d.   PHYSICAL EXAMINATION:  He is a well built man, not in acute distress.  Right eye blindness with a prosthesis.  Oropharynx, nasopharynx are clear.  Head is atraumatic, normocephalic.  CHEST:  Clinically clear.  HEART:  Heart sounds 1 and 2 regular.  ABDOMEN:  Benign.  NEUROLOGICAL:  He is alert and oriented x3, no asterixis.  EXTREMITIES:  Peripheral pulses present, no pedal edema.   LABORATORY DATA:  WBC 18, hematocrit 38, platelet count 260, glucose 18,  sodium 141, potassium 4, chloride 105, bicarbonate 30, BUN 4, creatinine  0.7, was involved in his counseling and his tests explained to him.  Verbalized understanding. Discharge greater than 30 minutes.      Herbie Saxon, MD  Electronically Signed     MIO/MEDQ  D:  07/21/2006  T:  07/21/2006  Job:  [id number]

## 2010-09-12 NOTE — H&P (Signed)
[name], [name]                 ACCOUNT NO.:  [id number]   MEDICAL RECORD NO.:  [id number]          PATIENT TYPE:  EMS   LOCATION:  MAJO                         FACILITY:  [facility]   PHYSICIAN:  Deirdre Peer. Polite, M.D. DATE OF BIRTH:  1956/03/19   DATE OF ADMISSION:  05/04/2005  DATE OF DISCHARGE:                                HISTORY & PHYSICAL   CHIEF COMPLAINT:  Seizure.   HISTORY OF PRESENT ILLNESS:  [name] is a 55 year old male with a known  history of a seizure disorder, polysubstance abuse, and medication  noncompliance who presents to the ED, reporting having a seizure on May 04, 2005.  The patient states he has been without his medicine over 2-3 days,  his Depakote.  The patient feels that this is the reason for him having a  seizure.  The patient denies any trauma to the head or body.  The patient  still continues to use ETOH (last used approximately 3 days ago), as well as  cocaine.  The patient presents to the ED with the above complaint of  seizure, and was thought to a subtherapeutic Depakote level.  Otherwise,  vitals were hemodynamically stable.  Hospitalization was felt necessary for  oral loading of Depakote.   PAST MEDICAL HISTORY:  As stated above.   MEDICATIONS ON ADMISSION:  1.  Depakote 250 mg t.i.d.  Per patient, his dose had recently been      increased to 500 mg t.i.d.; however, he has never refilled that      medicine.  2.  Dilantin 100 mg t.i.d.   SOCIAL HISTORY:  Positive for tobacco one pack per week.  Positive for  alcohol; his last drink was approximately 3 days ago.  Admits to drugs,  codeine.   ALLERGIES:  Denies any allergies.   FAMILY HISTORY:  Per patient, no significant past medical history.   REVIEW OF SYSTEMS:  As stated above.   PHYSICAL EXAMINATION:  GENERAL:  The patient is alert and oriented x3.  HEENT:  Within normal limits.  CHEST:  Clear to auscultation.  CARDIOVASCULAR:  Regular rate and rhythm.  No S3.  ABDOMEN:   Noncontributory.  EXTREMITIES:  No edema.  NEUROLOGIC:  Nonfocal.   LABORATORY DATA:  CBC within normal limits.  BMET within normal limits.  Valproic acid less than 10.  Dilantin level 32.5.   ASSESSMENT:  1.  Reported seizure per patient.  2.  Known history of seizure disorder.  3.  Subtherapeutic Depakote level.  4.  History of patient noncompliance.  5.  History of polysubstance abuse which includes cocaine and alcohol.   RECOMMENDATIONS:  Recommend the patient be admitted to a telemetry floor bed  for evaluation and treatment of recurrent seizures with subtherapeutic  level.  Must also rule out ETOH withdrawal seizure.  The patient will be  located with Depakote, which has been already started in the ED.  The  patient's Dilantin will be held until his Dilantin slides into a more  therapeutic range.  The patient will be continued on a multivitamin,  thiamine, folate, and  prophylactic Ativan.  I will follow the drug levels in  the a.m.  If no signs of withdrawal, the patient can be discharged in 24-48  hours.  Will make further recommendations as deemed necessary.      Deirdre Peer. Polite, M.D.  Electronically Signed     RDP/MEDQ  D:  05/05/2005  T:  05/05/2005  Job:  [id number]

## 2010-09-12 NOTE — Discharge Summary (Signed)
NAMETRACKER, MANCE                 ACCOUNT NO.:  [id number]   MEDICAL RECORD NO.:  [id number]          PATIENT TYPE:  INP   LOCATION:  4733                         FACILITY:  [facility]   PHYSICIAN:  Jackie Plum, M.D.DATE OF BIRTH:  Nov 14, 1955   DATE OF ADMISSION:  05/04/2005  DATE OF DISCHARGE:  05/11/2005                                 DISCHARGE SUMMARY   DISCHARGE DIAGNOSIS:  1.  Seizure disorder.  2.  Alcohol withdrawal, resolved.  3.  History of substance abuse and noncompliance to medications.   DISCHARGE MEDICATIONS:  The patient is going to be on Dilantin 125 mg  b.i.d., Depakote 625 mg p.o. t.i.d.  He will also be on Thiamine 100 mg  daily and multi-vitamin one capsule daily.   DISCHARGE INSTRUCTIONS:  The patient is to follow up with his primary care  Yakira Duquette, Gerda Diss, NP of [facility], in about 1-2 weeks, he is to call  for appointment.  At the time of his discharge appointment and follow up,  the patient will need to have a Depakote level and Dilantin level drawn as a  follow up.   CONDITION ON DISCHARGE:  Improved and satisfactory.   REASON FOR ADMISSION:  Seizure activity.   The patient was admitted by Dr. Nehemiah Settle on May 04, 2005, after presenting  with seizure activity.  He had been noncompliant with his medications for  about 2-3 days.  He had been abusing alcohol and cocaine.  On admission, the  patient was hemodynamically stable with normal cardiovascular and pulmonary  auscultation.  Neurologic testing was nonfocal and his valproic acid was  less than 10 with Dilantin level of 32.5 which was supratherapeutic.  He is  admitted to telemetry bed for management.  The patient had the dose of his  valproic acid adjusted as well as the dose of his Dilantin.  His Dilantin  level corrected and was within normal ranges.  However, his Depakote level  was slightly subtherapeutic and the patient's Depakote level will need to be  rechecked in about two weeks  time when satisfactory level is likely to be  achieved.  The patient had alcohol withdrawal which was mild and was treated  with benzodiazepines with improvement.  __________  12 mcg per mL which is  therapeutic.  His valproic acid, however, was 41.4 mg per mL with a goal to  reach the level of 50 to 100.  The patient will need repeat reassessment of  the Depakote level in about 1-2 weeks as noted above.  The patient is  discharged in satisfactory condition.  He should  not approach any heavy machinery or __________  or other risks.  The patient  has a history of seizure disorder and all the issues regarding further  seizures in terms of heavy machinery operation and the risks to himself and  others was reiterated.  He seemed to very much __________  and has been  educated previously as far as I could tell.      Jackie Plum, M.D.  Electronically Signed     GO/MEDQ  D:  05/11/2005  T:  05/11/2005  Job:  [id number]   cc:   Gerda Diss  [facility]

## 2010-09-28 ENCOUNTER — Emergency Department (HOSPITAL_COMMUNITY)
Admission: EM | Admit: 2010-09-28 | Discharge: 2010-09-28 | Disposition: A | Discharge status: Home / Self Care | Payer: PRIVATE HEALTH INSURANCE | Attending: Emergency Medicine | Admitting: Emergency Medicine

## 2010-09-28 DIAGNOSIS — G40909 Epilepsy, unspecified, not intractable, without status epilepticus: Secondary | ICD-10-CM | POA: Insufficient documentation

## 2010-09-28 DIAGNOSIS — R404 Transient alteration of awareness: Secondary | ICD-10-CM | POA: Insufficient documentation

## 2010-09-28 DIAGNOSIS — R42 Dizziness and giddiness: Secondary | ICD-10-CM | POA: Insufficient documentation

## 2010-09-28 DIAGNOSIS — R5381 Other malaise: Secondary | ICD-10-CM | POA: Insufficient documentation

## 2010-09-28 DIAGNOSIS — E1169 Type 2 diabetes mellitus with other specified complication: Secondary | ICD-10-CM | POA: Insufficient documentation

## 2010-09-28 DIAGNOSIS — R5383 Other fatigue: Secondary | ICD-10-CM | POA: Insufficient documentation

## 2010-09-28 LAB — GLUCOSE, CAPILLARY: Glucose-Capillary: 111 mg/dL — ABNORMAL HIGH (ref 70–99)

## 2010-09-29 LAB — GLUCOSE, CAPILLARY: Glucose-Capillary: 62 mg/dL — ABNORMAL LOW (ref 70–99)

## 2010-10-07 ENCOUNTER — Ambulatory Visit (HOSPITAL_COMMUNITY)
Admission: RE | Admit: 2010-10-07 | Discharge: 2010-10-07 | Disposition: A | Discharge status: Home / Self Care | Payer: PRIVATE HEALTH INSURANCE | Source: Ambulatory Visit | Attending: Internal Medicine | Admitting: Internal Medicine

## 2010-10-07 ENCOUNTER — Other Ambulatory Visit: Payer: Self-pay | Admitting: Internal Medicine

## 2010-10-07 DIAGNOSIS — R0602 Shortness of breath: Secondary | ICD-10-CM

## 2010-10-07 DIAGNOSIS — R634 Abnormal weight loss: Secondary | ICD-10-CM

## 2010-11-06 ENCOUNTER — Emergency Department (HOSPITAL_COMMUNITY)
Admission: EM | Admit: 2010-11-06 | Discharge: 2010-11-06 | Disposition: A | Discharge status: Home / Self Care | Payer: PRIVATE HEALTH INSURANCE | Attending: Emergency Medicine | Admitting: Emergency Medicine

## 2010-11-06 DIAGNOSIS — R569 Unspecified convulsions: Secondary | ICD-10-CM | POA: Insufficient documentation

## 2010-11-06 DIAGNOSIS — E119 Type 2 diabetes mellitus without complications: Secondary | ICD-10-CM | POA: Insufficient documentation

## 2010-11-06 LAB — COMPREHENSIVE METABOLIC PANEL
AST: 19 U/L (ref 0–37)
Albumin: 3.4 g/dL — ABNORMAL LOW (ref 3.5–5.2)
BUN: 24 mg/dL — ABNORMAL HIGH (ref 6–23)
Calcium: 9.1 mg/dL (ref 8.4–10.5)
Creatinine, Ser: 0.92 mg/dL (ref 0.50–1.35)
Total Bilirubin: 0.2 mg/dL — ABNORMAL LOW (ref 0.3–1.2)
Total Protein: 6.8 g/dL (ref 6.0–8.3)

## 2010-11-06 LAB — CBC
HCT: 39.3 % (ref 39.0–52.0)
MCH: 30.6 pg (ref 26.0–34.0)
MCHC: 34.1 g/dL (ref 30.0–36.0)
MCV: 89.7 fL (ref 78.0–100.0)
Platelets: 205 10*3/uL (ref 150–400)
RDW: 13.8 % (ref 11.5–15.5)

## 2010-11-06 LAB — DIFFERENTIAL
Eosinophils Absolute: 0 10*3/uL (ref 0.0–0.7)
Eosinophils Relative: 1 % (ref 0–5)
Lymphocytes Relative: 48 % — ABNORMAL HIGH (ref 12–46)
Lymphs Abs: 2.8 10*3/uL (ref 0.7–4.0)
Monocytes Absolute: 0.6 10*3/uL (ref 0.1–1.0)

## 2010-11-06 LAB — RAPID URINE DRUG SCREEN, HOSP PERFORMED
Amphetamines: NOT DETECTED
Cocaine: POSITIVE — AB
Opiates: NOT DETECTED
Tetrahydrocannabinol: NOT DETECTED

## 2010-11-06 LAB — URINALYSIS, ROUTINE W REFLEX MICROSCOPIC
Glucose, UA: NEGATIVE mg/dL
Hgb urine dipstick: NEGATIVE
Specific Gravity, Urine: 1.027 (ref 1.005–1.030)
pH: 8 (ref 5.0–8.0)

## 2010-11-06 LAB — ETHANOL: Alcohol, Ethyl (B): <11 mg/dL (ref 0–11)

## 2010-11-07 LAB — URINE CULTURE: Culture: NO GROWTH

## 2010-11-23 ENCOUNTER — Inpatient Hospital Stay (HOSPITAL_COMMUNITY)
Admission: EM | Admit: 2010-11-23 | Discharge: 2010-11-26 | DRG: 637 | Disposition: A | Discharge status: Skilled Nursing Facility | Payer: PRIVATE HEALTH INSURANCE | Attending: Internal Medicine | Admitting: Internal Medicine

## 2010-11-23 ENCOUNTER — Emergency Department (HOSPITAL_COMMUNITY): Payer: PRIVATE HEALTH INSURANCE

## 2010-11-23 DIAGNOSIS — F1010 Alcohol abuse, uncomplicated: Secondary | ICD-10-CM | POA: Diagnosis present

## 2010-11-23 DIAGNOSIS — K7682 Hepatic encephalopathy: Secondary | ICD-10-CM | POA: Diagnosis present

## 2010-11-23 DIAGNOSIS — K7290 Hepatic failure, unspecified without coma: Secondary | ICD-10-CM | POA: Diagnosis present

## 2010-11-23 DIAGNOSIS — F17200 Nicotine dependence, unspecified, uncomplicated: Secondary | ICD-10-CM | POA: Diagnosis present

## 2010-11-23 DIAGNOSIS — E1169 Type 2 diabetes mellitus with other specified complication: Principal | ICD-10-CM | POA: Diagnosis present

## 2010-11-23 DIAGNOSIS — G40909 Epilepsy, unspecified, not intractable, without status epilepticus: Secondary | ICD-10-CM | POA: Diagnosis present

## 2010-11-23 LAB — TROPONIN I: Troponin I: <0.3 ng/mL (ref <0.30)

## 2010-11-23 LAB — GLUCOSE, CAPILLARY
Glucose-Capillary: 109 mg/dL — ABNORMAL HIGH (ref 70–99)
Glucose-Capillary: 88 mg/dL (ref 70–99)
Glucose-Capillary: 30 mg/dL — CL (ref 70–99)

## 2010-11-23 LAB — POCT I-STAT, CHEM 8
BUN: 19 mg/dL (ref 6–23)
Calcium, Ion: 1.14 mmol/L (ref 1.12–1.32)
Chloride: 104 mEq/L (ref 96–112)
Creatinine, Ser: 0.8 mg/dL (ref 0.50–1.35)

## 2010-11-23 LAB — MAGNESIUM: Magnesium: 1.8 mg/dL (ref 1.5–2.5)

## 2010-11-23 LAB — CK TOTAL AND CKMB (NOT AT ARMC)
Relative Index: 2.5 (ref 0.0–2.5)
Total CK: 110 U/L (ref 7–232)

## 2010-11-23 LAB — VALPROIC ACID LEVEL: Valproic Acid Lvl: 27.9 ug/mL — ABNORMAL LOW (ref 50.0–100.0)

## 2010-11-23 LAB — AMMONIA: Ammonia: 104 umol/L — ABNORMAL HIGH (ref 11–60)

## 2010-11-23 LAB — PHENYTOIN LEVEL, TOTAL: Phenytoin Lvl: 18.9 ug/mL (ref 10.0–20.0)

## 2010-11-24 LAB — COMPREHENSIVE METABOLIC PANEL
Alkaline Phosphatase: 54 U/L (ref 39–117)
BUN: 21 mg/dL (ref 6–23)
GFR calc Af Amer: >60 mL/min (ref >60)
GFR calc non Af Amer: >60 mL/min (ref >60)
Glucose, Bld: 111 mg/dL — ABNORMAL HIGH (ref 70–99)
Potassium: 3.9 mEq/L (ref 3.5–5.1)
Total Bilirubin: 0.2 mg/dL — ABNORMAL LOW (ref 0.3–1.2)
Total Protein: 6 g/dL (ref 6.0–8.3)

## 2010-11-24 LAB — GLUCOSE, CAPILLARY
Glucose-Capillary: 62 mg/dL — ABNORMAL LOW (ref 70–99)
Glucose-Capillary: 76 mg/dL (ref 70–99)
Glucose-Capillary: 81 mg/dL (ref 70–99)
Glucose-Capillary: 144 mg/dL — ABNORMAL HIGH (ref 70–99)
Glucose-Capillary: 228 mg/dL — ABNORMAL HIGH (ref 70–99)

## 2010-11-24 LAB — DRUGS OF ABUSE SCREEN W/O ALC, ROUTINE URINE
Amphetamine Screen, Ur: NEGATIVE
Barbiturate Quant, Ur: NEGATIVE
Marijuana Metabolite: NEGATIVE
Methadone: NEGATIVE

## 2010-11-24 LAB — CBC
HCT: 39 % (ref 39.0–52.0)
MCHC: 34.1 g/dL (ref 30.0–36.0)
RDW: 14.3 % (ref 11.5–15.5)

## 2010-11-24 LAB — URINALYSIS, ROUTINE W REFLEX MICROSCOPIC
Glucose, UA: NEGATIVE mg/dL
Ketones, ur: NEGATIVE mg/dL
Leukocytes, UA: NEGATIVE
Nitrite: NEGATIVE
Protein, ur: NEGATIVE mg/dL

## 2010-11-24 LAB — TSH: TSH: 0.556 u[IU]/mL (ref 0.350–4.500)

## 2010-11-24 LAB — CARDIAC PANEL(CRET KIN+CKTOT+MB+TROPI)
CK, MB: 2.7 ng/mL (ref 0.3–4.0)
Relative Index: 2.6 — ABNORMAL HIGH (ref 0.0–2.5)

## 2010-11-24 LAB — HEMOGLOBIN A1C: Hgb A1c MFr Bld: 5.6 % (ref <5.7)

## 2010-11-25 LAB — COMPREHENSIVE METABOLIC PANEL
AST: 15 U/L (ref 0–37)
Albumin: 3.2 g/dL — ABNORMAL LOW (ref 3.5–5.2)
Chloride: 108 mEq/L (ref 96–112)
Creatinine, Ser: 0.65 mg/dL (ref 0.50–1.35)
Potassium: 4 mEq/L (ref 3.5–5.1)
Total Bilirubin: 0.2 mg/dL — ABNORMAL LOW (ref 0.3–1.2)
Total Protein: 6.1 g/dL (ref 6.0–8.3)

## 2010-11-25 LAB — AMMONIA: Ammonia: 64 umol/L — ABNORMAL HIGH (ref 11–60)

## 2010-11-25 LAB — GLUCOSE, CAPILLARY
Glucose-Capillary: 152 mg/dL — ABNORMAL HIGH (ref 70–99)
Glucose-Capillary: 130 mg/dL — ABNORMAL HIGH (ref 70–99)
Glucose-Capillary: 85 mg/dL (ref 70–99)

## 2010-11-25 LAB — CBC
MCH: 29.9 pg (ref 26.0–34.0)
Platelets: 201 10*3/uL (ref 150–400)
RBC: 4.31 MIL/uL (ref 4.22–5.81)
RDW: 14.3 % (ref 11.5–15.5)
WBC: 5.5 10*3/uL (ref 4.0–10.5)

## 2010-11-26 LAB — BASIC METABOLIC PANEL
Calcium: 8.9 mg/dL (ref 8.4–10.5)
GFR calc Af Amer: >60 mL/min (ref >60)
GFR calc non Af Amer: >60 mL/min (ref >60)
Glucose, Bld: 104 mg/dL — ABNORMAL HIGH (ref 70–99)
Potassium: 4.4 mEq/L (ref 3.5–5.1)
Sodium: 143 mEq/L (ref 135–145)

## 2010-11-26 LAB — GLUCOSE, CAPILLARY: Glucose-Capillary: 145 mg/dL — ABNORMAL HIGH (ref 70–99)

## 2010-11-30 NOTE — Discharge Summary (Signed)
  [name], [name]                 ACCOUNT NO.:  [id number]  MEDICAL RECORD NO.:  [id number]  LOCATION:  5530                         FACILITY:  [facility]  PHYSICIAN:  Conley Canal, MD      DATE OF BIRTH:  [birth year]  DATE OF ADMISSION:  11/23/2010 DATE OF DISCHARGE:  11/26/2010                        DISCHARGE SUMMARY - REFERRING   PRIMARY CARE PHYSICIAN:  The patient cannot recall his primary care provider.  DISCHARGE DIAGNOSES: 1. Hypoglycemia. 2. Hepatic encephalopathy. 3. Seizure disorder. 4. Alcohol abuse. 5. Tobacco abuse. 6. History of polysubstance abuse. 7. Diabetes mellitus type 2.  DISCHARGE MEDICATIONS: 1. Folic acid 1 mg daily. 2. Lactulose 20 g twice daily. 3. Thiamine 100 mg daily. 4. Depakote as prescribed. 5. Keppra 500 mg twice daily. 6. Metformin 500 mg twice daily. 7. Phenytoin 100 mg 3 times daily.  PROCEDURES PERFORMED:  Chest x-ray on 07/29 showed no active cardiopulmonary disease.  HOSPITAL COURSE:  [name] was admitted on 07/29 with complaints of generalized weakness.  He apparently had not been feeling well for 3-4 days prior to the admission with complaints of drowsiness and occasional dizziness, feeling tired.  At admission, he was found to have blood sugars ranging 30-60 mg/dL.  He had been on metformin and glipizide.  He was taken off these medications, and his hemoglobin A1c was found to be 5.6.  He was also found to have hepatic encephalopathy with ammonia level of 104 on the day of admission.  His Dilantin and Depakote levels were within normal limits.  Urine drug screen was negative.  Initially, he was taken off Depakote, but I am not sure if this was responsible for symptoms.  He is continuing to have some gait instability, most likely related to encephalopathy.  Because of this, he has been referred to skilled nursing facility for physical therapy.  He was taken off glipizide because of the hypoglycemia and the normal hemoglobin  A1c especially in the setting of hepatic encephalopathy.  Today, he is quite lucid.  His ammonia level was 64.  I will start him on lactulose low-dose, and we will defer adjustment of this to his primary care physician.  Otherwise, other labs include electrolytes unremarkable, CBC also unremarkable with MCV of 89.  He is discharged in stable condition to skilled nursing facility.  Time spent for this discharge preparation is less than 30 minutes.     Conley Canal, MD     SR/MEDQ  D:  11/26/2010  T:  11/26/2010  Job:  [id number]  Electronically Signed by Conley Canal  on 11/30/2010 05:45:43 PM

## 2010-12-16 NOTE — H&P (Signed)
NAME:  [name], [name]                 ACCOUNT NO.:  [id number]  MEDICAL RECORD NO.:  [id number]  LOCATION:  [facility]                         FACILITY:  [facility]  PHYSICIAN:  Richarda Overlie, MD       DATE OF BIRTH:  Dec 21, 1955  DATE OF ADMISSION:  11/23/2010 DATE OF DISCHARGE:                             HISTORY & PHYSICAL   PRIMARY CARE PHYSICIAN:  The patient has a primary care provider but he cannot recall the name at this point.  CHIEF COMPLAINT:  Weakness.  SUBJECTIVE:  This is a 55 year old male with a history of seizure disorder and type 2 diabetes who presents to the ER with a chief complaint of generalized weakness.  The patient states he has not been feeling well for the last 3-4 days.  He complains of drowsiness and occasional dizziness.  He has also been feeling tired.  He has been feeling diaphoretic and occasionally cold and clammy.  He also complains of substernal chest pain but denies any shortness of breath, any orthopnea, any paroxysmal nocturnal dyspnea, dependent edema.  The patient has also occasionally experienced back pain and right flank pain.  He complains of a frontal headache associated with nausea and one episode of vomiting.  He notes that his sugars have been very low and they have been as low as 40 and then 30 and then 60 at home.  He denies any recurrent seizures over the course of the last 1 year and has been quite stable.  He does have a history of polysubstance use and cocaine use but denies any ongoing drug use at this time.  The patient is being admitted for the above symptoms.  REVIEW OF SYSTEMS:  Complete review of systems was documented as in HPI.  PAST MEDICAL HISTORY:  History of seizure disorder, alcohol use, tobacco abuse, and polysubstance abuse.  ALLERGIES:  None.  CURRENT MEDICATIONS:  Dilantin, divalproex, glipizide, Glucophage, and Keppra.  SOCIAL HISTORY:  The patient has a history of tobacco use and continues alcohol 2 times a week  and continues to drink alcohol twice a week but is unable to quantify how much.  He has a remote history of cocaine abuse.  He states that he quit using cocaine several years ago.  FAMILY HISTORY:  Mother has history of hypertension, passed away in her 76s.  Father died in a motor vehicle accident.  He has 4 siblings who have no known medical history.  PHYSICAL EXAMINATION:  VITAL SIGNS:  Blood pressure 122/87 pulse of 78, respirations 18, temperature 98.1. GENERAL:  Comfortable in no acute cardiopulmonary distress. HEENT:  Pupils equal and reactive.  Extraocular movements intact. NECK:  Supple.  No JVD. LUNGS:  Clear to auscultation bilaterally.  No wheezes, crackles, or rhonchi. CARDIOVASCULAR:  Regular rate and rhythm.  No murmurs, rubs, or gallops. ABDOMEN:  Soft, nontender, nondistended.  No hepatosplenomegaly. EXTREMITIES:  Without cyanosis, clubbing, or edema. NEUROLOGIC:  Cranial nerves II-XII grossly intact. PSYCHIATRIC:  Appropriate mood and affect.  Valproic acid level is 27.9.  Dilantin level is 18.9.  Sodium 141, potassium 3.9, chloride 104, glucose 81, BUN 19, creatinine 0.8.  Urine culture shows no growth.  EKG is  pending.  Chest x-ray is pending.  ASSESSMENT/PLAN: 1. Neuroglycopenic symptoms secondary to recurrent hypoglycemia. 2. Rule out acute coronary syndrome. 3. Rule out ongoing polysubstance abuse.  PLAN:  The patient will be admitted to the telemetry floor.  We will cycle cardiac enzymes to rule out an acute coronary event versus arrhythmias.  We will also obtain a 2-D echo because of history of cocaine use to rule out underlying cardiomyopathy given his chest pain and rule out wall motion abnormalities.  We will obtain a urine drug screen and EtOH level.  We will also hold his glipizide and his metformin and start him on D5.  The patient will also have Accu-Cheks initiated.  If his sugars stay above 200, his D5 will be discontinued. The patient is also on  Depakote.  Therefore we will check an ammonia level.  We will continue his antiepileptic medications as previously his valproic acid level was subtherapeutic and therefore the dose may need to be adjusted once the dose as clarified by the pharmacist.  He is a full code.     Richarda Overlie, MD     NA/MEDQ  D:  11/23/2010  T:  11/23/2010  Job:  [id number]  Electronically Signed by Richarda Overlie MD on 12/16/2010 10:37:25 PM

## 2010-12-30 ENCOUNTER — Encounter: Payer: PRIVATE HEALTH INSURANCE | Attending: Internal Medicine | Admitting: Dietician

## 2010-12-30 DIAGNOSIS — E119 Type 2 diabetes mellitus without complications: Secondary | ICD-10-CM | POA: Insufficient documentation

## 2010-12-30 DIAGNOSIS — Z713 Dietary counseling and surveillance: Secondary | ICD-10-CM | POA: Insufficient documentation

## 2011-01-01 ENCOUNTER — Emergency Department (HOSPITAL_COMMUNITY)
Admission: EM | Admit: 2011-01-01 | Discharge: 2011-01-01 | Disposition: A | Discharge status: Home / Self Care | Payer: PRIVATE HEALTH INSURANCE | Attending: Emergency Medicine | Admitting: Emergency Medicine

## 2011-01-01 DIAGNOSIS — R5383 Other fatigue: Secondary | ICD-10-CM | POA: Insufficient documentation

## 2011-01-01 DIAGNOSIS — R5381 Other malaise: Secondary | ICD-10-CM | POA: Insufficient documentation

## 2011-01-01 DIAGNOSIS — H5440 Blindness, one eye, unspecified eye: Secondary | ICD-10-CM | POA: Insufficient documentation

## 2011-01-01 DIAGNOSIS — G40909 Epilepsy, unspecified, not intractable, without status epilepticus: Secondary | ICD-10-CM | POA: Insufficient documentation

## 2011-01-01 DIAGNOSIS — Z79899 Other long term (current) drug therapy: Secondary | ICD-10-CM | POA: Insufficient documentation

## 2011-01-01 LAB — CBC
Hemoglobin: 15.2 g/dL (ref 13.0–17.0)
MCH: 30 pg (ref 26.0–34.0)
MCHC: 33.8 g/dL (ref 30.0–36.0)
MCV: 88.8 fL (ref 78.0–100.0)
RBC: 5.07 MIL/uL (ref 4.22–5.81)

## 2011-01-01 LAB — COMPREHENSIVE METABOLIC PANEL
BUN: 21 mg/dL (ref 6–23)
CO2: 30 mEq/L (ref 19–32)
Calcium: 9.1 mg/dL (ref 8.4–10.5)
Creatinine, Ser: 0.82 mg/dL (ref 0.50–1.35)
GFR calc Af Amer: >60 mL/min (ref >60)
GFR calc non Af Amer: >60 mL/min (ref >60)
Glucose, Bld: 84 mg/dL (ref 70–99)

## 2011-01-01 LAB — DIFFERENTIAL
Eosinophils Absolute: 0 10*3/uL (ref 0.0–0.7)
Lymphs Abs: 2 10*3/uL (ref 0.7–4.0)
Monocytes Absolute: 0.6 10*3/uL (ref 0.1–1.0)
Monocytes Relative: 12 % (ref 3–12)
Neutro Abs: 2.5 10*3/uL (ref 1.7–7.7)
Neutrophils Relative %: 48 % (ref 43–77)

## 2011-01-01 LAB — VALPROIC ACID LEVEL: Valproic Acid Lvl: 18.6 ug/mL — ABNORMAL LOW (ref 50.0–100.0)

## 2011-01-01 LAB — PHENYTOIN LEVEL, TOTAL: Phenytoin Lvl: 17 ug/mL (ref 10.0–20.0)

## 2011-01-07 ENCOUNTER — Encounter: Payer: Self-pay | Admitting: Dietician

## 2011-01-07 NOTE — Progress Notes (Signed)
Medical Nutrition Therapy:  Appt start time: 0830 end time:  1000.  Assessment:  Primary concerns today: Recently hospitalized and diagnosed with type 2 diabetes.and HgA1C level of 5.6%.  Was on Glipizide 10 mg BID and continued to have episodes of hypoglycemia and this was discontinued.  When in the hospital, he demonstrated a loss of muscle mass and his gait was not steady.  He was discharged to a skilled nursing facility for rehabilitation.  He has been there a number of weeks and he is more stable and the family is preparing to take him home.  They are choosing to live in an apartment on the ground floor until he is able to climb stairs.  His wife and daughter accompany him today.  He notes that is appetite is much improved.  They note that on d/c from the hospital, it was determined that the medications for his seizure were the cause of his liver and mental status changes.   Currently, his fasting blood glucose levels ar 70-181.  "It depends on when the staff takes my sugar.  Sometimes I go low and have to treat it and then I'm high by the time they take my sugar."  After dinner, he reports he typically runs in the 170 range.  He does not have his meter with him today.  He is only taking Metformin 500 mg each AM and when he is very physically active in PT, he often will experience a low blood glucose which he will treat with juice and/or peanut butter crackers.   MEDICATIONS: All are listed except for Kapra 500 mg that he takes 2x/day.  DIETARY INTAKE:  24-hr recall:  B (7-7:30 AM): Eggs (2), bacon/ham, whole wheat toast (2), Juice 8 oz.  Snk (10:00 AM) :fresh fruit serving or mixed canned fruit (1 cup)  L (12:00 PM): Chicken thigh, greens (1c), fruit (1c), grape juice 8 oz. Snk (3:00 PM): cake 1 slice with strawberries and frosting D (7:00 PM): Greens (1 c), chicken thigh, ww bread (1slice), juice 8 oz.  Snk (10:00 PM): Sucker, piece of candy and juice.  Recent physical activity: Currently  doing PT and helps himself around the facility in a W/C.  Estimated energy needs: 1600-1700 calories 180-185 g carbohydrates 120-125 g protein 44-47 g fat  Progress Towards Goal(s):  In progress.   Nutritional Diagnosis:  [place]-2.1 Inpaired nutrition utilization As related to glucose.  As evidenced by diagnosis of type 2 diabetes and recent episodes hypoglycemia with diabetes meds.    Intervention:  Nutrition Encouraged the use of regular meals and snacks.  He does not need to skip meals.  When eating regularly, he needs to use fruit not drink the juice.  He needs to discuss the frequent episodes of low blood glucose with his physician and they can determine if he needs the metformin.  He needs to check fasting blood glucose levels and to document the glucose levels when he is going low, not after he has treated the low with juice and PB crackers.  He needs to continue to include protein in his diet for repair of tissues and muscles.  Continue to use large servings of the non-starchy vegetables and to limit his intake to 2-3 servings of starchy and grains at each meal.  Handouts given during visit include:  Provided a diet prescription for 45-60 gm of carb at each meal and 15 gm at snack.  He is to include a protein serving at each meal and snack.  Provided the [employer] Carb Counting Booklet  Provided menus for 45 and 60 gm Carb Meals  Provided list of snack suggestions.  Blood Glucose Log Book  Monitoring/Evaluation:  Dietary intake, exercise, blood glucose levels, and body weight 8-12 weeks.

## 2011-01-19 LAB — DIFFERENTIAL
Basophils Relative: 2 — ABNORMAL HIGH
Eosinophils Absolute: 0
Lymphocytes Relative: 50 — ABNORMAL HIGH
Neutrophils Relative %: 37 — ABNORMAL LOW

## 2011-01-19 LAB — CBC
MCHC: 33.6
RBC: 4.59
WBC: 4.3

## 2011-01-19 LAB — BASIC METABOLIC PANEL
CO2: 30
Calcium: 8.8
Creatinine, Ser: 0.88
GFR calc Af Amer: >60
GFR calc non Af Amer: >60

## 2011-01-19 LAB — VALPROIC ACID LEVEL: Valproic Acid Lvl: 29.6 — ABNORMAL LOW

## 2011-01-19 LAB — PHENYTOIN LEVEL, TOTAL: Phenytoin Lvl: 8.8 — ABNORMAL LOW

## 2011-01-20 LAB — BASIC METABOLIC PANEL
BUN: 23
Creatinine, Ser: 0.85
GFR calc non Af Amer: >60
Potassium: 3.9

## 2011-01-20 LAB — PHENYTOIN LEVEL, TOTAL: Phenytoin Lvl: 11.9

## 2011-01-22 LAB — POCT I-STAT, CHEM 8
Calcium, Ion: 1.15
Hemoglobin: 15.3
Sodium: 141
TCO2: 30

## 2011-01-22 LAB — VALPROIC ACID LEVEL: Valproic Acid Lvl: 59.1

## 2011-01-22 LAB — PHENYTOIN LEVEL, TOTAL: Phenytoin Lvl: 12.7

## 2011-01-26 LAB — DIFFERENTIAL
Basophils Absolute: 0.1
Eosinophils Relative: 1
Monocytes Absolute: 1
Monocytes Relative: 8
Neutrophils Relative %: 29 — ABNORMAL LOW

## 2011-01-26 LAB — PHENYTOIN LEVEL, TOTAL
Phenytoin Lvl: 9.6 — ABNORMAL LOW
Phenytoin Lvl: 24.4 — ABNORMAL HIGH

## 2011-01-26 LAB — CBC
MCHC: 32.6
Platelets: 246
RBC: 5.25
WBC: 12.7 — ABNORMAL HIGH

## 2011-01-26 LAB — POCT I-STAT, CHEM 8
Calcium, Ion: 1.17
Creatinine, Ser: 1.2
Hemoglobin: 17.7 — ABNORMAL HIGH
Sodium: 142
TCO2: 19

## 2011-01-26 LAB — URINALYSIS, ROUTINE W REFLEX MICROSCOPIC
Bilirubin Urine: NEGATIVE
Glucose, UA: NEGATIVE
Hgb urine dipstick: NEGATIVE
Specific Gravity, Urine: 1.029
Urobilinogen, UA: 0.2
pH: 5.5

## 2011-01-26 LAB — URINE MICROSCOPIC-ADD ON

## 2011-01-26 LAB — VALPROIC ACID LEVEL: Valproic Acid Lvl: 65.9

## 2011-01-26 LAB — GLUCOSE, CAPILLARY

## 2011-01-27 LAB — BASIC METABOLIC PANEL
BUN: 11 mg/dL (ref 6–23)
BUN: 14 mg/dL (ref 6–23)
CO2: 30 mEq/L (ref 19–32)
Calcium: 8.5 mg/dL (ref 8.4–10.5)
Calcium: 8.6 mg/dL (ref 8.4–10.5)
Creatinine, Ser: 0.78 mg/dL (ref 0.4–1.5)
Creatinine, Ser: 0.82 mg/dL (ref 0.4–1.5)
GFR calc non Af Amer: >60 mL/min (ref >60)
GFR calc non Af Amer: >60 mL/min (ref >60)
Glucose, Bld: 87 mg/dL (ref 70–99)
Glucose, Bld: 99 mg/dL (ref 70–99)
Potassium: 3.8 mEq/L (ref 3.5–5.1)

## 2011-01-27 LAB — POCT I-STAT, CHEM 8
BUN: 19
Calcium, Ion: 1.22
Calcium, Ion: 1.23
Calcium, Ion: 1.15
Creatinine, Ser: 1
Creatinine, Ser: 1
Glucose, Bld: 77
HCT: 39 % (ref 39.0–52.0)
Hemoglobin: 13.3 g/dL (ref 13.0–17.0)
Hemoglobin: 13.6
Hemoglobin: 14.3
Hemoglobin: 15
Potassium: 4.1 mEq/L (ref 3.5–5.1)
Sodium: 141 mEq/L (ref 135–145)
Sodium: 143
Sodium: 145
Sodium: 143
TCO2: 29 mmol/L (ref 0–100)
TCO2: 30
TCO2: 32
TCO2: 29

## 2011-01-27 LAB — COMPREHENSIVE METABOLIC PANEL
ALT: 12 U/L (ref 0–53)
AST: 13 U/L (ref 0–37)
AST: 21
Alkaline Phosphatase: 68 U/L (ref 39–117)
Alkaline Phosphatase: 55
BUN: 19
CO2: 29 mEq/L (ref 19–32)
CO2: 30
Calcium: 9.1 mg/dL (ref 8.4–10.5)
Chloride: 104
Creatinine, Ser: 0.81
GFR calc Af Amer: >60 mL/min (ref >60)
GFR calc Af Amer: >60
GFR calc non Af Amer: >60 mL/min (ref >60)
GFR calc non Af Amer: >60
Glucose, Bld: 83 mg/dL (ref 70–99)
Potassium: 3.8 mEq/L (ref 3.5–5.1)
Sodium: 142 mEq/L (ref 135–145)
Total Bilirubin: 0.7
Total Protein: 7.1 g/dL (ref 6.0–8.3)

## 2011-01-27 LAB — URINALYSIS, ROUTINE W REFLEX MICROSCOPIC
Bilirubin Urine: NEGATIVE
Glucose, UA: NEGATIVE
Hgb urine dipstick: NEGATIVE
Ketones, ur: NEGATIVE
Protein, ur: NEGATIVE
Urobilinogen, UA: 0.2

## 2011-01-27 LAB — VALPROIC ACID LEVEL
Valproic Acid Lvl: 60.1
Valproic Acid Lvl: 63.9

## 2011-01-27 LAB — DRUGS OF ABUSE SCREEN W/O ALC, ROUTINE URINE
Amphetamine Screen, Ur: NEGATIVE
Barbiturate Quant, Ur: NEGATIVE
Benzodiazepines.: NEGATIVE
Opiate Screen, Urine: NEGATIVE
Phencyclidine (PCP): NEGATIVE

## 2011-01-27 LAB — DIFFERENTIAL
Basophils Absolute: 0
Basophils Relative: 0 % (ref 0–1)
Basophils Relative: 1
Eosinophils Absolute: 0 10*3/uL (ref 0.0–0.7)
Eosinophils Relative: 0 % (ref 0–5)
Eosinophils Relative: 3
Lymphocytes Relative: 36
Lymphs Abs: 2.7 10*3/uL (ref 0.7–4.0)
Monocytes Relative: 14 % — ABNORMAL HIGH (ref 3–12)
Neutrophils Relative %: 58 % (ref 43–77)

## 2011-01-27 LAB — CBC
HCT: 36.8 % — ABNORMAL LOW (ref 39.0–52.0)
HCT: 39.6
Hemoglobin: 13.4 g/dL (ref 13.0–17.0)
MCHC: 33.1 g/dL (ref 30.0–36.0)
MCHC: 33.6 g/dL (ref 30.0–36.0)
MCV: 90.9
Platelets: 263 10*3/uL (ref 150–400)
Platelets: 266 10*3/uL (ref 150–400)
RBC: 4.47 MIL/uL (ref 4.22–5.81)
RBC: 4.36
RDW: 14.1 % (ref 11.5–15.5)
RDW: 14.3 % (ref 11.5–15.5)
WBC: 9.7 10*3/uL (ref 4.0–10.5)
WBC: 6.1

## 2011-01-27 LAB — CULTURE, ROUTINE-ABSCESS
Culture: NO GROWTH
Gram Stain: NONE SEEN

## 2011-01-27 LAB — PROTIME-INR
INR: 1.1 (ref 0.00–1.49)
Prothrombin Time: 14 seconds (ref 11.6–15.2)

## 2011-01-27 LAB — URINE CULTURE: Culture: NO GROWTH

## 2011-01-27 LAB — ETHANOL: Alcohol, Ethyl (B): <5

## 2011-01-27 LAB — AFB CULTURE WITH SMEAR (NOT AT ARMC): Acid Fast Smear: NONE SEEN

## 2011-01-27 LAB — PHENYTOIN LEVEL, TOTAL: Phenytoin Lvl: 6.4 — ABNORMAL LOW

## 2011-01-29 ENCOUNTER — Encounter: Payer: Medicare Other | Attending: Internal Medicine | Admitting: Dietician

## 2011-01-29 LAB — DIFFERENTIAL
Eosinophils Absolute: 0 10*3/uL (ref 0.0–0.7)
Eosinophils Relative: 0 % (ref 0–5)
Lymphocytes Relative: 14 % (ref 12–46)
Lymphs Abs: 2 10*3/uL (ref 0.7–4.0)
Monocytes Absolute: 1.4 10*3/uL — ABNORMAL HIGH (ref 0.1–1.0)
Monocytes Relative: 10 % (ref 3–12)

## 2011-01-29 LAB — URINALYSIS, ROUTINE W REFLEX MICROSCOPIC
Bilirubin Urine: NEGATIVE
Glucose, UA: NEGATIVE mg/dL
Hgb urine dipstick: NEGATIVE
Ketones, ur: 15 mg/dL — AB
Nitrite: NEGATIVE
Specific Gravity, Urine: 1.037 — ABNORMAL HIGH (ref 1.005–1.030)
pH: 6 (ref 5.0–8.0)

## 2011-01-29 LAB — CBC
MCHC: 34.5 g/dL (ref 30.0–36.0)
MCV: 88.3 fL (ref 78.0–100.0)
Platelets: 189 10*3/uL (ref 150–400)
RBC: 4.95 MIL/uL (ref 4.22–5.81)
WBC: 14.3 10*3/uL — ABNORMAL HIGH (ref 4.0–10.5)

## 2011-01-29 LAB — COMPREHENSIVE METABOLIC PANEL
ALT: 20 U/L (ref 0–53)
AST: 27 U/L (ref 0–37)
Albumin: 4 g/dL (ref 3.5–5.2)
CO2: 29 mEq/L (ref 19–32)
Calcium: 9.1 mg/dL (ref 8.4–10.5)
Creatinine, Ser: 1 mg/dL (ref 0.4–1.5)
GFR calc Af Amer: >60 mL/min (ref >60)
GFR calc non Af Amer: >60 mL/min (ref >60)
Sodium: 139 mEq/L (ref 135–145)

## 2011-02-04 LAB — CBC
HCT: 44.6
Hemoglobin: 14.8
MCHC: 33.1
MCV: 90.6
Platelets: 294
RBC: 4.92
RDW: 14.4 — ABNORMAL HIGH
WBC: 13.3 — ABNORMAL HIGH

## 2011-02-04 LAB — BASIC METABOLIC PANEL
CO2: 17 — ABNORMAL LOW
Calcium: 9.3
Chloride: 105
GFR calc Af Amer: >60
Glucose, Bld: 162 — ABNORMAL HIGH
Potassium: 3.4 — ABNORMAL LOW
Sodium: 144

## 2011-02-04 LAB — URINALYSIS, ROUTINE W REFLEX MICROSCOPIC
Bilirubin Urine: NEGATIVE
Glucose, UA: NEGATIVE
Ketones, ur: NEGATIVE
Leukocytes, UA: NEGATIVE
Nitrite: NEGATIVE
Protein, ur: 30 — AB
Specific Gravity, Urine: 1.025
Urobilinogen, UA: 0.2
pH: 6

## 2011-02-04 LAB — BASIC METABOLIC PANEL WITH GFR
BUN: 17
Creatinine, Ser: 1.08
GFR calc non Af Amer: >60

## 2011-02-04 LAB — PATHOLOGIST SMEAR REVIEW

## 2011-02-04 LAB — URINE MICROSCOPIC-ADD ON

## 2011-02-04 LAB — RAPID URINE DRUG SCREEN, HOSP PERFORMED
Amphetamines: NOT DETECTED
Barbiturates: NOT DETECTED
Benzodiazepines: NOT DETECTED
Cocaine: NOT DETECTED
Opiates: NOT DETECTED
Tetrahydrocannabinol: NOT DETECTED

## 2011-02-04 LAB — PHENYTOIN LEVEL, TOTAL: Phenytoin Lvl: 5.8 — ABNORMAL LOW

## 2011-02-04 LAB — DIFFERENTIAL
Basophils Absolute: 0.1
Basophils Relative: 1
Eosinophils Absolute: 0.1
Eosinophils Relative: 1
Lymphocytes Relative: 59 — ABNORMAL HIGH
Lymphs Abs: 7.9 — ABNORMAL HIGH
Monocytes Absolute: 0.8 — ABNORMAL HIGH
Monocytes Relative: 6
Neutro Abs: 4.4
Neutrophils Relative %: 33 — ABNORMAL LOW

## 2011-02-04 LAB — ETHANOL: Alcohol, Ethyl (B): <5

## 2011-02-04 LAB — VALPROIC ACID LEVEL: Valproic Acid Lvl: 52.6

## 2011-02-05 LAB — I-STAT 8, (EC8 V) (CONVERTED LAB)
BUN: 17
Chloride: 107
Glucose, Bld: 87
HCT: 42
Hemoglobin: 14.3
Hemoglobin: 15.3
Operator id: 285841
Potassium: 4.3
Potassium: 5.3 — ABNORMAL HIGH
Sodium: 139
Sodium: 140
pH, Ven: 7.353 — ABNORMAL HIGH

## 2011-02-05 LAB — HEPATIC FUNCTION PANEL
ALT: 25
Albumin: 3.7
Indirect Bilirubin: 0.5
Total Protein: 7.1

## 2011-02-05 LAB — RAPID URINE DRUG SCREEN, HOSP PERFORMED
Benzodiazepines: NOT DETECTED
Cocaine: NOT DETECTED
Opiates: NOT DETECTED
Tetrahydrocannabinol: NOT DETECTED
Tetrahydrocannabinol: NOT DETECTED

## 2011-02-05 LAB — DIFFERENTIAL
Eosinophils Absolute: 0.1
Lymphs Abs: 2.6
Monocytes Relative: 10
Neutro Abs: 1.7
Neutrophils Relative %: 34 — ABNORMAL LOW

## 2011-02-05 LAB — CBC
MCV: 88.9
Platelets: 160
RBC: 4.7
WBC: 5

## 2011-02-05 LAB — VALPROIC ACID LEVEL
Valproic Acid Lvl: 16.5 — ABNORMAL LOW
Valproic Acid Lvl: 24.8 — ABNORMAL LOW

## 2011-02-05 LAB — PHENYTOIN LEVEL, TOTAL
Phenytoin Lvl: 16.2
Phenytoin Lvl: 22 — ABNORMAL HIGH
Phenytoin Lvl: 5.6 — ABNORMAL LOW

## 2011-02-05 LAB — ETHANOL
Alcohol, Ethyl (B): <5
Alcohol, Ethyl (B): <5

## 2011-02-06 LAB — RAPID URINE DRUG SCREEN, HOSP PERFORMED
Amphetamines: NOT DETECTED
Benzodiazepines: NOT DETECTED
Cocaine: NOT DETECTED
Opiates: NOT DETECTED
Tetrahydrocannabinol: NOT DETECTED

## 2011-02-06 LAB — I-STAT 8, (EC8 V) (CONVERTED LAB)
Acid-Base Excess: 3 — ABNORMAL HIGH
BUN: 25 — ABNORMAL HIGH
Bicarbonate: 26.1 — ABNORMAL HIGH
Bicarbonate: 28.7 — ABNORMAL HIGH
Chloride: 108
Chloride: 105
Glucose, Bld: 90
HCT: 46
Operator id: 279831
TCO2: 30
pCO2, Ven: 32.2 — ABNORMAL LOW
pCO2, Ven: 47
pH, Ven: 7.394 — ABNORMAL HIGH

## 2011-02-06 LAB — DIFFERENTIAL
Basophils Absolute: 0.1
Basophils Absolute: 0
Basophils Relative: 0
Eosinophils Relative: 1
Eosinophils Relative: 0
Lymphocytes Relative: 39
Lymphs Abs: 2.3
Monocytes Absolute: 0.5
Monocytes Absolute: 0.5
Monocytes Relative: 10
Neutro Abs: 3
Neutro Abs: 3

## 2011-02-06 LAB — POCT I-STAT CREATININE
Creatinine, Ser: 0.9
Operator id: 279831

## 2011-02-06 LAB — BASIC METABOLIC PANEL
CO2: 22
Calcium: 9.2
Chloride: 108
GFR calc Af Amer: >60
Glucose, Bld: 87
Potassium: 3.7
Sodium: 143

## 2011-02-06 LAB — CBC
HCT: 41.8
HCT: 39
Hemoglobin: 14.4
Hemoglobin: 13.4
MCHC: 34.4
MCV: 86.7
Platelets: 248
RBC: 4.49
RDW: 14.9 — ABNORMAL HIGH
WBC: 6

## 2011-02-06 LAB — VALPROIC ACID LEVEL: Valproic Acid Lvl: 54.8

## 2011-02-06 LAB — PHENYTOIN LEVEL, TOTAL
Phenytoin Lvl: 20.3 — ABNORMAL HIGH
Phenytoin Lvl: 16.4

## 2011-02-06 LAB — ETHANOL: Alcohol, Ethyl (B): <5

## 2011-02-12 LAB — I-STAT 8, (EC8 V) (CONVERTED LAB)
Acid-Base Excess: 3 — ABNORMAL HIGH
Bicarbonate: 27 — ABNORMAL HIGH
Chloride: 109
HCT: 43
Hemoglobin: 14.6
Operator id: 196461
Sodium: 140
pCO2, Ven: 39.2 — ABNORMAL LOW

## 2011-02-12 LAB — POCT I-STAT CREATININE: Creatinine, Ser: 1.2

## 2011-02-12 LAB — PHENYTOIN LEVEL, TOTAL: Phenytoin Lvl: 22.5 — ABNORMAL HIGH

## 2011-02-24 ENCOUNTER — Emergency Department (HOSPITAL_COMMUNITY)
Admission: EM | Admit: 2011-02-24 | Discharge: 2011-02-24 | Disposition: A | Discharge status: Home / Self Care | Payer: PRIVATE HEALTH INSURANCE | Attending: Emergency Medicine | Admitting: Emergency Medicine

## 2011-02-24 DIAGNOSIS — R51 Headache: Secondary | ICD-10-CM | POA: Insufficient documentation

## 2011-02-24 DIAGNOSIS — G40909 Epilepsy, unspecified, not intractable, without status epilepticus: Secondary | ICD-10-CM | POA: Insufficient documentation

## 2011-02-24 DIAGNOSIS — Z79899 Other long term (current) drug therapy: Secondary | ICD-10-CM | POA: Insufficient documentation

## 2011-02-24 LAB — POCT I-STAT, CHEM 8
BUN: 21 mg/dL (ref 6–23)
Calcium, Ion: 1.18 mmol/L (ref 1.12–1.32)
Creatinine, Ser: 1.1 mg/dL (ref 0.50–1.35)
Hemoglobin: 15.6 g/dL (ref 13.0–17.0)
TCO2: 29 mmol/L (ref 0–100)

## 2011-02-24 LAB — VALPROIC ACID LEVEL: Valproic Acid Lvl: 48.9 ug/mL — ABNORMAL LOW (ref 50.0–100.0)

## 2011-02-24 LAB — GLUCOSE, CAPILLARY: Glucose-Capillary: 176 mg/dL — ABNORMAL HIGH (ref 70–99)

## 2011-02-24 LAB — CBC
HCT: 44 % (ref 39.0–52.0)
Hemoglobin: 14.5 g/dL (ref 13.0–17.0)
MCHC: 33 g/dL (ref 30.0–36.0)

## 2011-04-06 ENCOUNTER — Other Ambulatory Visit: Payer: Self-pay

## 2011-04-06 ENCOUNTER — Emergency Department (HOSPITAL_COMMUNITY)
Admission: EM | Admit: 2011-04-06 | Discharge: 2011-04-06 | Disposition: A | Discharge status: Home / Self Care | Payer: PRIVATE HEALTH INSURANCE | Attending: Emergency Medicine | Admitting: Emergency Medicine

## 2011-04-06 ENCOUNTER — Emergency Department (HOSPITAL_COMMUNITY): Payer: PRIVATE HEALTH INSURANCE

## 2011-04-06 ENCOUNTER — Encounter (HOSPITAL_COMMUNITY): Payer: Self-pay

## 2011-04-06 DIAGNOSIS — R5381 Other malaise: Secondary | ICD-10-CM | POA: Insufficient documentation

## 2011-04-06 DIAGNOSIS — R42 Dizziness and giddiness: Secondary | ICD-10-CM | POA: Insufficient documentation

## 2011-04-06 DIAGNOSIS — E119 Type 2 diabetes mellitus without complications: Secondary | ICD-10-CM | POA: Insufficient documentation

## 2011-04-06 DIAGNOSIS — H5440 Blindness, one eye, unspecified eye: Secondary | ICD-10-CM | POA: Insufficient documentation

## 2011-04-06 DIAGNOSIS — R51 Headache: Secondary | ICD-10-CM | POA: Insufficient documentation

## 2011-04-06 DIAGNOSIS — G40909 Epilepsy, unspecified, not intractable, without status epilepticus: Secondary | ICD-10-CM | POA: Insufficient documentation

## 2011-04-06 DIAGNOSIS — R05 Cough: Secondary | ICD-10-CM | POA: Insufficient documentation

## 2011-04-06 DIAGNOSIS — R5383 Other fatigue: Secondary | ICD-10-CM | POA: Insufficient documentation

## 2011-04-06 DIAGNOSIS — M25559 Pain in unspecified hip: Secondary | ICD-10-CM | POA: Insufficient documentation

## 2011-04-06 DIAGNOSIS — Z79899 Other long term (current) drug therapy: Secondary | ICD-10-CM | POA: Insufficient documentation

## 2011-04-06 DIAGNOSIS — R531 Weakness: Secondary | ICD-10-CM

## 2011-04-06 DIAGNOSIS — R059 Cough, unspecified: Secondary | ICD-10-CM | POA: Insufficient documentation

## 2011-04-06 HISTORY — DX: Unspecified convulsions: R56.9

## 2011-04-06 LAB — RAPID URINE DRUG SCREEN, HOSP PERFORMED
Barbiturates: NOT DETECTED
Tetrahydrocannabinol: NOT DETECTED

## 2011-04-06 LAB — CBC
Hemoglobin: 13.6 g/dL (ref 13.0–17.0)
MCH: 30 pg (ref 26.0–34.0)
RBC: 4.54 MIL/uL (ref 4.22–5.81)

## 2011-04-06 LAB — DIFFERENTIAL
Eosinophils Absolute: 0 10*3/uL (ref 0.0–0.7)
Lymphocytes Relative: 55 % — ABNORMAL HIGH (ref 12–46)
Lymphs Abs: 3.4 10*3/uL (ref 0.7–4.0)
Monocytes Relative: 8 % (ref 3–12)
Neutro Abs: 2.3 10*3/uL (ref 1.7–7.7)
Neutrophils Relative %: 36 % — ABNORMAL LOW (ref 43–77)

## 2011-04-06 LAB — AMMONIA: Ammonia: 65 umol/L — ABNORMAL HIGH (ref 11–60)

## 2011-04-06 LAB — VALPROIC ACID LEVEL: Valproic Acid Lvl: 105.3 ug/mL — ABNORMAL HIGH (ref 50.0–100.0)

## 2011-04-06 LAB — COMPREHENSIVE METABOLIC PANEL
Alkaline Phosphatase: 72 U/L (ref 39–117)
BUN: 19 mg/dL (ref 6–23)
CO2: 25 mEq/L (ref 19–32)
Chloride: 102 mEq/L (ref 96–112)
GFR calc Af Amer: >90 mL/min (ref >90)
GFR calc non Af Amer: >90 mL/min (ref >90)
Glucose, Bld: 89 mg/dL (ref 70–99)
Potassium: 3.7 mEq/L (ref 3.5–5.1)
Total Bilirubin: 0.2 mg/dL — ABNORMAL LOW (ref 0.3–1.2)

## 2011-04-06 LAB — ETHANOL: Alcohol, Ethyl (B): <11 mg/dL (ref 0–11)

## 2011-04-06 MED ORDER — SODIUM CHLORIDE 0.9 % IV SOLN
INTRAVENOUS | Status: DC
  Administered 2011-04-06: 11:00:00 via INTRAVENOUS

## 2011-04-06 NOTE — ED Notes (Signed)
Patient presents with generalized weakness since this AM at 0900 with headache and right flank pain.   Grips equal bilaterally, no arm drift or facial drooping noted.  CBG 179.

## 2011-04-06 NOTE — ED Provider Notes (Signed)
History     CSN: [id number] Arrival date & time: 04/06/2011  9:44 AM   First MD Initiated Contact with Patient 04/06/11 [phone]      Chief Complaint  Patient presents with  . Weakness    (Consider location/radiation/quality/duration/timing/severity/associated sxs/prior treatment) Patient is a 55 y.o. male presenting with weakness. The history is provided by the patient.  Weakness Primary symptoms do not include focal weakness, fever, nausea or vomiting.  Additional symptoms include weakness.   patient states he began to feel generalized weakness and feeling bad mood working we are day. He states he elected to pass out. He states that people told me didn't look good. No chest pain. No numbness or focal weakness. He states he has a right-sided headache. No nausea or vomiting. He has a chronic cough. No dysuria. He states he feels worse he gets up and walks around. He states he has some right hip pain it comes and goes but he said that for a while. His previous history of seizures he thought that maybe sugar was low, the wound was checked and was normal. .   Past Medical History  Diagnosis Date  . Seizures   . Diabetes mellitus     History reviewed. No pertinent past surgical history.  History reviewed. No pertinent family history.  History  Substance Use Topics  . Smoking status: Former Smoker    Types: Cigarettes    Quit date: 11/06/2010  . Smokeless tobacco: Never Used  . Alcohol Use: No      Review of Systems  Constitutional: Negative for fever and chills.  Respiratory: Negative for choking.   Gastrointestinal: Negative for nausea, vomiting, abdominal pain, constipation and abdominal distention.  Musculoskeletal: Negative for myalgias and back pain.  Neurological: Positive for weakness and light-headedness. Negative for focal weakness, syncope and numbness.  Psychiatric/Behavioral: Negative for behavioral problems.    Allergies  Review of patient's allergies  indicates no known allergies.  Home Medications   Current Outpatient Rx  Name Route Sig Dispense Refill  . DIVALPROEX SODIUM 500 MG PO TBEC Oral Take 500 mg by mouth 3 (three) times daily.     Marland Kitchen LEVETIRACETAM 500 MG PO TABS Oral Take 500 mg by mouth every 12 (twelve) hours.      Marland Kitchen METFORMIN HCL 500 MG PO TABS Oral Take 500 mg by mouth daily.     Marland Kitchen PHENYTOIN SODIUM EXTENDED 100 MG PO CAPS Oral Take 100 mg by mouth 3 (three) times daily.       BP 117/82  Pulse 74  Temp(Src) 97.9 F (36.6 C) (Oral)  Resp 18  SpO2 98%  Physical Exam  Nursing note and vitals reviewed. Constitutional: He is oriented to person, place, and time. He appears well-developed and well-nourished.  HENT:  Head: Normocephalic and atraumatic.  Eyes: EOM are normal. Pupils are equal, round, and reactive to light.       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  Neck: Normal range of motion. Neck supple.  Cardiovascular: Normal rate, regular rhythm and normal heart sounds.  Gallop:     No murmur heard. Pulmonary/Chest: Effort normal and breath sounds normal.  Abdominal: Soft. Bowel sounds are normal. He exhibits no distension and no mass. There is no tenderness. There is no rebound and no guarding.  Musculoskeletal: Normal range of motion. He exhibits no edema.  Neurological: He is alert and oriented to person, place, and time. No cranial nerve deficit.  Skin: Skin is warm and dry.  Psychiatric: He has a normal mood and affect.    ED Course  Procedures (including critical care time)  Labs Reviewed  GLUCOSE, CAPILLARY - Abnormal; Notable for the following:    Glucose-Capillary 179 (*)    All other components within normal limits  DIFFERENTIAL - Abnormal; Notable for the following:    Neutrophils Relative 36 (*)    Lymphocytes Relative 55 (*)    All other components within normal limits  COMPREHENSIVE METABOLIC PANEL - Abnormal; Notable for the following:    Total Bilirubin 0.2 (*)    All other  components within normal limits  AMMONIA - Abnormal; Notable for the following:    Ammonia 65 (*)    All other components within normal limits  URINE RAPID DRUG SCREEN (HOSP PERFORMED) - Abnormal; Notable for the following:    Cocaine POSITIVE (*)    All other components within normal limits  VALPROIC ACID LEVEL - Abnormal; Notable for the following:    Valproic Acid Lvl 105.3 (*)    All other components within normal limits  CBC  PHENYTOIN LEVEL, TOTAL  ETHANOL  TROPONIN I   Dg Chest 2 View  04/06/2011  *RADIOLOGY REPORT*  Clinical Data: Dizziness  CHEST - 2 VIEW  Comparison: 11/23/2010  Findings: Cardiomediastinal silhouette is stable.  No acute infiltrate or pleural effusion.  No pulmonary edema.  Bony thorax is stable.  IMPRESSION: No active disease.  No significant change.  Original Report Authenticated By: Natasha Mead, M.D.     1. Weakness     Date: 04/06/2011  Rate: 69  Rhythm: normal sinus rhythm  QRS Axis: normal  Intervals: normal  ST/T Wave abnormalities: nonspecific T wave changes  Conduction Disutrbances:none  Narrative Interpretation:   Old EKG Reviewed: unchanged     MDM  Patient presented with generalized weakness. States the headache is right flank pain. He later told that it was possibly because he cocaine yesterday not today. No chest pain. Laboratory reassuring except for a pneumonia that is chronically elevated. His Depakote level is also slightly above normal. You'll need to followup with both of these. He states he feels better wants to go home. He'll be discharged home.        Juliet Rude. Rubin Payor, MD 04/06/11 1430

## 2011-04-06 NOTE — ED Notes (Signed)
Gave old and new ECG to Dr. Tanya Nones after I performed. 11:08 am JG

## 2011-06-02 ENCOUNTER — Emergency Department (HOSPITAL_COMMUNITY)
Admission: EM | Admit: 2011-06-02 | Discharge: 2011-06-02 | Disposition: A | Discharge status: Home / Self Care | Payer: PRIVATE HEALTH INSURANCE | Attending: Emergency Medicine | Admitting: Emergency Medicine

## 2011-06-02 ENCOUNTER — Encounter (HOSPITAL_COMMUNITY): Payer: Self-pay | Admitting: *Deleted

## 2011-06-02 DIAGNOSIS — E119 Type 2 diabetes mellitus without complications: Secondary | ICD-10-CM | POA: Insufficient documentation

## 2011-06-02 DIAGNOSIS — Z79899 Other long term (current) drug therapy: Secondary | ICD-10-CM | POA: Insufficient documentation

## 2011-06-02 DIAGNOSIS — G40909 Epilepsy, unspecified, not intractable, without status epilepticus: Secondary | ICD-10-CM | POA: Insufficient documentation

## 2011-06-02 DIAGNOSIS — R51 Headache: Secondary | ICD-10-CM | POA: Insufficient documentation

## 2011-06-02 LAB — POCT I-STAT, CHEM 8
Glucose, Bld: 80 mg/dL (ref 70–99)
HCT: 43 % (ref 39.0–52.0)
Hemoglobin: 14.6 g/dL (ref 13.0–17.0)
Potassium: 4.1 mEq/L (ref 3.5–5.1)
Sodium: 142 mEq/L (ref 135–145)
TCO2: 29 mmol/L (ref 0–100)

## 2011-06-02 LAB — RAPID URINE DRUG SCREEN, HOSP PERFORMED
Opiates: NOT DETECTED
Tetrahydrocannabinol: NOT DETECTED

## 2011-06-02 MED ORDER — OXYCODONE-ACETAMINOPHEN 5-325 MG PO TABS
1.0000 | ORAL_TABLET | Freq: Once | ORAL | Status: AC
  Administered 2011-06-02: 1 via ORAL
  Filled 2011-06-02: qty 1

## 2011-06-02 MED ORDER — LORAZEPAM 2 MG/ML IJ SOLN
1.0000 mg | Freq: Once | INTRAMUSCULAR | Status: AC
  Administered 2011-06-02: 1 mg via INTRAVENOUS
  Filled 2011-06-02: qty 1

## 2011-06-02 NOTE — ED Notes (Signed)
Pt requesting breakfast tray. MD Patria Mane made aware stating pt not able to eat.

## 2011-06-02 NOTE — ED Notes (Signed)
Per EMS pt from home, stating he "feels like I'm gonna have a seizure". States has been taking medication as prescribed. Vital signs stable. CBG 77. Pt alert and oriented x 3. Pt reports "wheels spinning and that's how he knows he's gonna have a seizure".

## 2011-06-02 NOTE — ED Notes (Signed)
Lunch tray ordered 

## 2011-06-02 NOTE — ED Notes (Signed)
OZH:[id number]<HQ> Expected date:06/02/11<BR> Expected time: 8:46 AM<BR> Means of arrival:Ambulance<BR> Comments:<BR> 55yo;seizure

## 2011-06-02 NOTE — ED Notes (Signed)
Pt non-cooperative with edp during exam.  Pt appears to be agitated.

## 2011-06-02 NOTE — ED Notes (Signed)
Contacted lab at [facility], states Dilantin level sent to [employer], will not result until tomorrow. MD made aware.

## 2011-06-02 NOTE — ED Provider Notes (Signed)
History     CSN: [id number]  Arrival date & time 06/02/11  4098   First MD Initiated Contact with Patient 06/02/11 [phone]      Chief Complaint  Patient presents with  . Seizures    (Consider location/radiation/quality/duration/timing/severity/associated sxs/prior treatment) Patient is a 56 y.o. male presenting with seizures. The history is provided by the patient.  Seizures    the patient reports mild headache and a sensation that he is about to have a seizure.  The patient does have a seizure disorder for which he takes Depakote and Dilantin.  He is also on metformin.  He has not had a seizure.  He reports compliance with all of his medications including his antiepileptics.  His no recent head trauma.  He has no change in his vision.  He denies weakness of his upper lower tremors.  He reports his strength is normal.  He reports that walk without difficulty.  Past Medical History  Diagnosis Date  . Seizures   . Diabetes mellitus     No past surgical history on file.  No family history on file.  History  Substance Use Topics  . Smoking status: Former Smoker    Types: Cigarettes    Quit date: 11/06/2010  . Smokeless tobacco: Never Used  . Alcohol Use: No      Review of Systems  Neurological: Positive for seizures.  All other systems reviewed and are negative.    Allergies  Review of patient's allergies indicates no known allergies.  Home Medications   Current Outpatient Rx  Name Route Sig Dispense Refill  . DIVALPROEX SODIUM 500 MG PO TBEC Oral Take 500 mg by mouth 3 (three) times daily.     Marland Kitchen METFORMIN HCL 500 MG PO TABS Oral Take 500 mg by mouth daily.     Marland Kitchen PHENYTOIN SODIUM EXTENDED 100 MG PO CAPS Oral Take 100 mg by mouth 3 (three) times daily.       BP 128/86  Pulse 75  Temp(Src) 98.8 F (37.1 C) (Oral)  Resp 16  SpO2 100%  Physical Exam  Nursing note and vitals reviewed. Constitutional: He is oriented to person, place, and time. He appears  well-developed and well-nourished.  HENT:  Head: Normocephalic and atraumatic.  Eyes: EOM are normal. Pupils are equal, round, and reactive to light.  Neck: Normal range of motion.  Cardiovascular: Normal rate, regular rhythm, normal heart sounds and intact distal pulses.   Pulmonary/Chest: Effort normal and breath sounds normal. No respiratory distress.  Abdominal: Soft. He exhibits no distension. There is no tenderness.  Musculoskeletal: Normal range of motion.  Neurological: He is alert and oriented to person, place, and time.       5/5 strength in major muscle groups of  bilateral upper and lower extremities. Speech normal. No facial asymetry. Normal gait. Normal finger to nose and heel to shin bilaterally  Skin: Skin is warm and dry.  Psychiatric: He has a normal mood and affect. Judgment normal.    ED Course  Procedures (including critical care time)  Labs Reviewed  URINE RAPID DRUG SCREEN (HOSP PERFORMED) - Abnormal; Notable for the following:    Cocaine POSITIVE (*)    All other components within normal limits  POCT I-STAT, CHEM 8 - Abnormal; Notable for the following:    Calcium, Ion 1.09 (*)    All other components within normal limits  VALPROIC ACID LEVEL  PHENYTOIN LEVEL, FREE   No results found.   1. Headache  2. Seizure disorder       MDM  The patient is well-appearing.  His neurologic exam is normal.  He is still using cocaine for which a call and not to use a longer period his valproic acid level is normal.  His Dilantin level was sent off however is still pending and will not be ready until tomorrow secondary to no ability to obtain a Dilantin level this hospital.  He reports compliance and therefore do not think this is low normal I.  He has had a seizure.  DC home in good condition.        Lyanne Co, MD 06/02/11 [phone]

## 2011-06-05 ENCOUNTER — Emergency Department (HOSPITAL_COMMUNITY)
Admission: EM | Admit: 2011-06-05 | Discharge: 2011-06-05 | Disposition: A | Discharge status: Home / Self Care | Payer: PRIVATE HEALTH INSURANCE | Attending: Emergency Medicine | Admitting: Emergency Medicine

## 2011-06-05 ENCOUNTER — Encounter (HOSPITAL_COMMUNITY): Payer: Self-pay | Admitting: Emergency Medicine

## 2011-06-05 DIAGNOSIS — E119 Type 2 diabetes mellitus without complications: Secondary | ICD-10-CM | POA: Insufficient documentation

## 2011-06-05 DIAGNOSIS — Z79899 Other long term (current) drug therapy: Secondary | ICD-10-CM | POA: Insufficient documentation

## 2011-06-05 DIAGNOSIS — R51 Headache: Secondary | ICD-10-CM | POA: Insufficient documentation

## 2011-06-05 DIAGNOSIS — M542 Cervicalgia: Secondary | ICD-10-CM | POA: Insufficient documentation

## 2011-06-05 LAB — POCT I-STAT, CHEM 8
BUN: 20 mg/dL (ref 6–23)
Hemoglobin: 15 g/dL (ref 13.0–17.0)
Potassium: 4.3 mEq/L (ref 3.5–5.1)
Sodium: 145 mEq/L (ref 135–145)
TCO2: 30 mmol/L (ref 0–100)

## 2011-06-05 LAB — GLUCOSE, CAPILLARY: Glucose-Capillary: 77 mg/dL (ref 70–99)

## 2011-06-05 LAB — VALPROIC ACID LEVEL: Valproic Acid Lvl: 49.9 ug/mL — ABNORMAL LOW (ref 50.0–100.0)

## 2011-06-05 MED ORDER — SODIUM CHLORIDE 0.9 % IV SOLN
Freq: Once | INTRAVENOUS | Status: AC
  Administered 2011-06-05: 999 mL via INTRAVENOUS

## 2011-06-05 MED ORDER — METOCLOPRAMIDE HCL 5 MG/ML IJ SOLN
10.0000 mg | Freq: Once | INTRAMUSCULAR | Status: AC
  Administered 2011-06-05: 10 mg via INTRAVENOUS
  Filled 2011-06-05: qty 2

## 2011-06-05 MED ORDER — DIPHENHYDRAMINE HCL 50 MG/ML IJ SOLN
25.0000 mg | Freq: Once | INTRAMUSCULAR | Status: AC
  Administered 2011-06-05: 25 mg via INTRAVENOUS
  Filled 2011-06-05: qty 1

## 2011-06-05 NOTE — ED Notes (Signed)
Pt has had headache for 5 days.  Was seen at [facility] on 5th for same and was not satisfied with what he was told.  Patient co dizziness associated with headache and leg cramps.  Was

## 2011-06-05 NOTE — ED Notes (Signed)
Pt request to speak with MD as his pain is still an 8 and wants "something" for the pain

## 2011-06-05 NOTE — ED Notes (Signed)
Pt given [place] sandwich and coke.  MD approved

## 2011-06-05 NOTE — ED Notes (Signed)
Pt has history of seizures and has had a headache for 5 days.  Was seen at [facility] on 5th for same.  Was told he has cocaine in system and claims he has never used cocaine.  Pt wanted to know depakote levels at [facility] but was told they were unavailable.  Pt wants to check his sugar and take his daily meds

## 2011-06-05 NOTE — ED Provider Notes (Signed)
History     CSN: [id number]  Arrival date & time 06/05/11  0919   First MD Initiated Contact with Patient 06/05/11 [phone]      Chief Complaint  Patient presents with  . Headache    (Consider location/radiation/quality/duration/timing/severity/associated sxs/prior treatment) HPI Comments: Patient with a history of substance abuse, diabetes and seizures presents emergency department with chief complaint of headache.  Patient states she's had a headache for about 5 days and was evaluated on January 5 at [facility] for the same type of headache.  Patient states the headache is located around his whole head and patient state that his neck is sore as well.  Patient verbalizes increased stress.  Patient denies associated symptoms including photophobia, nausea, vomiting, change in vision, weakness of extremities, numbness or tingling of extremities, syncope, chest pain, shortness of breath.  Patient is a 56 y.o. male presenting with headaches. The history is provided by the patient.  Headache  This is a new problem. The current episode started more than 2 days ago. The problem occurs constantly. The problem has not changed since onset.The headache is associated with emotional stress. Pain location: band like. Quality: non throbbing  The pain is at a severity of 5/10. The pain is mild. The pain does not radiate. Pertinent negatives include no anorexia, no fever, no malaise/fatigue, no chest pressure, no near-syncope, no orthopnea, no palpitations, no syncope, no shortness of breath, no nausea and no vomiting. Associated symptoms comments: Neck soarness. He has tried nothing for the symptoms.    Past Medical History  Diagnosis Date  . Seizures   . Diabetes mellitus     History reviewed. No pertinent past surgical history.  History reviewed. No pertinent family history.  History  Substance Use Topics  . Smoking status: Former Smoker    Types: Cigarettes    Quit date: 11/06/2010  . Smokeless  tobacco: Never Used  . Alcohol Use: No      Review of Systems  Constitutional: Negative for fever and malaise/fatigue.  HENT: Positive for neck pain. Negative for neck stiffness.   Respiratory: Negative for shortness of breath.   Cardiovascular: Negative for palpitations, orthopnea, syncope and near-syncope.  Gastrointestinal: Negative for nausea, vomiting and anorexia.  Neurological: Positive for headaches. Negative for dizziness, seizures, syncope, facial asymmetry, speech difficulty, weakness, light-headedness and numbness.  All other systems reviewed and are negative.    Allergies  Review of patient's allergies indicates no known allergies.  Home Medications   Current Outpatient Rx  Name Route Sig Dispense Refill  . DIVALPROEX SODIUM 500 MG PO TBEC Oral Take 1,500-2,000 mg by mouth 2 (two) times daily. 2000mg  every morning and 1500mg  every evening    . LEVETIRACETAM 500 MG PO TABS Oral Take 500 mg by mouth 2 (two) times daily.    Marland Kitchen METFORMIN HCL 500 MG PO TABS Oral Take 500 mg by mouth daily.     Marland Kitchen PHENYTOIN SODIUM EXTENDED 100 MG PO CAPS Oral Take 100 mg by mouth 3 (three) times daily.       BP 118/76  Pulse 66  Temp(Src) 97.9 F (36.6 C) (Oral)  Resp 17  SpO2 99%  Physical Exam  Nursing note and vitals reviewed. Constitutional: He is oriented to person, place, and time. He appears well-developed and well-nourished. No distress.  HENT:  Head: Normocephalic and atraumatic.       Temporal pulse present, no jaw claudication. Right glass eye. Left eye pupil reactive to light.   Eyes: Conjunctivae and  EOM are normal. No scleral icterus.  Neck: Normal range of motion and full passive range of motion without pain. Neck supple. No JVD present. Carotid bruit is not present. No rigidity. No Brudzinski's sign noted.  Cardiovascular: Normal rate, regular rhythm, normal heart sounds and intact distal pulses.   Pulmonary/Chest: Effort normal and breath sounds normal. No  respiratory distress. He has no wheezes. He has no rales.  Musculoskeletal: Normal range of motion.  Lymphadenopathy:    He has no cervical adenopathy.  Neurological: He is alert and oriented to person, place, and time. He has normal strength. No cranial nerve deficit or sensory deficit. He displays a negative Romberg sign. Coordination and gait normal. GCS eye subscore is 4. GCS verbal subscore is 5. GCS motor subscore is 6.       A&O x3.  Able to follow commands. No vertical or bidirectional nystagmus. Shoulder shrug, facial muscles, tongue protrusion and swallow intact.  Motor strength 5/5 bilaterally.  Intact finger to nose, shin to heel and rapid alternating movements. No ataxia or dysequilibrium.   Skin: Skin is warm and dry. No rash noted. He is not diaphoretic.  Psychiatric: He has a normal mood and affect. His behavior is normal.    ED Course  Procedures (including critical care time)  Labs Reviewed  PHENYTOIN LEVEL, TOTAL - Abnormal; Notable for the following:    Phenytoin Lvl 23.9 (*)    All other components within normal limits  VALPROIC ACID LEVEL - Abnormal; Notable for the following:    Valproic Acid Lvl 49.9 (*)    All other components within normal limits  GLUCOSE, CAPILLARY  POCT I-STAT, CHEM 8   No results found.   No diagnosis found.    MDM  Tension HA  Patient's headache treated in the emergency department with Reglan Benadryl and fluids.  Patient states that his headache has improved.  Patient was concerned of hyperglycemia however his glucose reading in the emergency department was 87. Pt had no focal neurological deficits on PE and states his HA has improved some. Pt to be dc with PCP follow up.        Jaci Carrel, [place] 06/05/11 1322

## 2011-06-05 NOTE — ED Notes (Signed)
Second IV attempt by [facility] RN unsuccessful

## 2011-06-05 NOTE — ED Notes (Signed)
IV team called and are on the way to attempt access

## 2011-06-05 NOTE — ED Notes (Signed)
Per MD pt is OK to take home meds

## 2011-06-05 NOTE — ED Notes (Signed)
Attempted IV start, unsuccessful.  Paged IV start team on pt request

## 2011-06-05 NOTE — ED Provider Notes (Signed)
Medical screening examination/treatment/procedure(s) were performed by non-physician practitioner and as supervising physician I was immediately available for consultation/collaboration.   Dione Booze, MD 06/05/11 1705

## 2011-06-05 NOTE — ED Provider Notes (Signed)
A 56 year old male complains of a headache for the last 4-5 days. He states the headache is global and throbbing. He does be worse in the morning gets better as the day goes on. He said he takes over-the-counter medications which give him temporary relief of his headache. On exam, it he has significant tenderness over the paracervical muscles and pain is elicited with head movement although there is no meningismus. Clinically, this appears to be a muscle contraction headache. He will be given an empiric trial of Reglan to see if he gets improvement.  Dione Booze, MD 06/05/11 1003

## 2011-06-05 NOTE — ED Notes (Signed)
Seizure pads applied to stretcher

## 2011-06-07 LAB — PHENYTOIN LEVEL, FREE AND TOTAL
Phenytoin Bound: 17.4 mg/L
Phenytoin, Free: 3.6 mg/L — ABNORMAL HIGH (ref 1.0–2.0)
Phenytoin, Total: 21 mg/L — ABNORMAL HIGH (ref 10.0–20.0)

## 2011-08-02 ENCOUNTER — Emergency Department (HOSPITAL_COMMUNITY)
Admission: EM | Admit: 2011-08-02 | Discharge: 2011-08-02 | Disposition: A | Discharge status: Home / Self Care | Payer: PRIVATE HEALTH INSURANCE | Attending: Emergency Medicine | Admitting: Emergency Medicine

## 2011-08-02 ENCOUNTER — Encounter (HOSPITAL_COMMUNITY): Payer: Self-pay | Admitting: *Deleted

## 2011-08-02 DIAGNOSIS — R51 Headache: Secondary | ICD-10-CM

## 2011-08-02 DIAGNOSIS — F17200 Nicotine dependence, unspecified, uncomplicated: Secondary | ICD-10-CM | POA: Insufficient documentation

## 2011-08-02 DIAGNOSIS — G8929 Other chronic pain: Secondary | ICD-10-CM | POA: Insufficient documentation

## 2011-08-02 DIAGNOSIS — M549 Dorsalgia, unspecified: Secondary | ICD-10-CM

## 2011-08-02 DIAGNOSIS — E119 Type 2 diabetes mellitus without complications: Secondary | ICD-10-CM | POA: Insufficient documentation

## 2011-08-02 MED ORDER — DIPHENHYDRAMINE HCL 50 MG/ML IJ SOLN
25.0000 mg | Freq: Once | INTRAMUSCULAR | Status: AC
  Administered 2011-08-02: 18:00:00 via INTRAMUSCULAR
  Filled 2011-08-02: qty 1

## 2011-08-02 MED ORDER — KETOROLAC TROMETHAMINE 60 MG/2ML IM SOLN
60.0000 mg | Freq: Once | INTRAMUSCULAR | Status: AC
  Administered 2011-08-02: 60 mg via INTRAMUSCULAR
  Filled 2011-08-02: qty 2

## 2011-08-02 MED ORDER — NAPROXEN 500 MG PO TABS: 500.0000 mg | ORAL_TABLET | Freq: Two times a day (BID) | ORAL | Status: DC

## 2011-08-02 MED ORDER — METOCLOPRAMIDE HCL 5 MG/ML IJ SOLN
10.0000 mg | Freq: Once | INTRAMUSCULAR | Status: AC
  Administered 2011-08-02: 10 mg via INTRAMUSCULAR
  Filled 2011-08-02: qty 2

## 2011-08-02 NOTE — ED Provider Notes (Signed)
History     CSN: [id number]  Arrival date & time 08/02/11  1425   First MD Initiated Contact with Patient 08/02/11 1657      Chief Complaint  Patient presents with  . Headache    left leg caused him to be off balance  . Back Pain    (Consider location/radiation/quality/duration/timing/severity/associated sxs/prior treatment) HPI Comments: Patient presents today with multiple complaints.  He complains of a frontal headache for the last 3 days.  It is similar to prior headaches.  No associated nausea, vomiting, weakness or numbness.  Notably patient has been able to ambulate around the emergency department without difficulty.  Patient also complains of left-sided back pain that is worse with movement.  No specific trauma.  No fevers.  No dysuria.  No hematuria.  Patient believes he may have had auras that occur prior to his seizures but has had no seizure activity.  Patient reports that he is taking his medications.  Patient is a 56 y.o. male presenting with headaches and back pain. The history is provided by the patient. No language interpreter was used.  Headache  This is a recurrent problem. The problem occurs constantly. The problem has not changed since onset.The pain is located in the frontal region. The quality of the pain is described as throbbing. The pain does not radiate. Pertinent negatives include no fever, no shortness of breath, no nausea and no vomiting.  Back Pain  Associated symptoms include headaches. Pertinent negatives include no chest pain, no fever and no abdominal pain.    Past Medical History  Diagnosis Date  . Seizures   . Diabetes mellitus     History reviewed. No pertinent past surgical history.  History reviewed. No pertinent family history.  History  Substance Use Topics  . Smoking status: Current Some Day Smoker    Types: Cigarettes    Last Attempt to Quit: 11/06/2010  . Smokeless tobacco: Never Used  . Alcohol Use: Yes     occ      Review  of Systems  Constitutional: Negative.  Negative for fever and chills.  Eyes: Negative.  Negative for discharge and redness.  Respiratory: Negative.  Negative for cough and shortness of breath.   Cardiovascular: Negative.  Negative for chest pain.  Gastrointestinal: Negative.  Negative for nausea, vomiting and abdominal pain.  Genitourinary: Negative.  Negative for hematuria.  Musculoskeletal: Positive for back pain.  Skin: Negative.  Negative for color change and rash.  Neurological: Positive for headaches. Negative for syncope.  Hematological: Negative.  Negative for adenopathy.  Psychiatric/Behavioral: Negative.  Negative for confusion.  All other systems reviewed and are negative.    Allergies  Review of patient's allergies indicates no known allergies.  Home Medications   Current Outpatient Rx  Name Route Sig Dispense Refill  . DIVALPROEX SODIUM 500 MG PO TBEC Oral Take 1,500-2,000 mg by mouth 2 (two) times daily. 2000mg  every morning and 1500mg  every evening    . LEVETIRACETAM 500 MG PO TABS Oral Take 500 mg by mouth 2 (two) times daily.    Marland Kitchen METFORMIN HCL 500 MG PO TABS Oral Take 500 mg by mouth daily.     Marland Kitchen PHENYTOIN SODIUM EXTENDED 100 MG PO CAPS Oral Take 100 mg by mouth 3 (three) times daily.       There were no vitals taken for this visit.  Physical Exam  Nursing note and vitals reviewed. Constitutional: He is oriented to person, place, and time. He appears well-developed and well-nourished.  Non-toxic appearance. He does not have a sickly appearance.  HENT:  Head: Normocephalic and atraumatic.  Eyes: Conjunctivae, EOM and lids are normal. Pupils are equal, round, and reactive to light.  Neck: Trachea normal, normal range of motion and full passive range of motion without pain. Neck supple.  Cardiovascular: Normal rate, regular rhythm and normal heart sounds.   Pulmonary/Chest: Effort normal and breath sounds normal. No respiratory distress.  Abdominal: Soft. Normal  appearance. He exhibits no distension. There is no tenderness. There is no rebound and no CVA tenderness.  Musculoskeletal: Normal range of motion.  Neurological: He is alert and oriented to person, place, and time. He has normal strength.  Skin: Skin is warm, dry and intact. No rash noted.  Psychiatric: He has a normal mood and affect. His behavior is normal. Judgment and thought content normal.    ED Course  Procedures (including critical care time)  Labs Reviewed - No data to display No results found.   No diagnosis found.    MDM  Patient here with exacerbation of his chronic headaches.  He has no symptoms consistent with meningitis or subarachnoid hemorrhage.  Patient has had no seizure activity to necessitate further evaluation for this.  Patient has back pain with no acute trauma but appears to be musculoskeletal.  Patient is able to ambulate without difficulty here.  I believe the patient will be safe for discharge once we have treated his headache.        Nat Christen, MD 08/02/11 [id number]

## 2011-08-02 NOTE — ED Notes (Signed)
Pt brought in EMS with complaint of frontal headache for 3 days, mid back pain for 3 days with movement, Right leg makes his off balance-- no drift at all.  Pt thinks he has had oras for seizure but just has not had the seizure yet.  [id number] per ems

## 2011-08-02 NOTE — Discharge Instructions (Signed)
Back Pain, Adult  Low back pain is very common. About 1 in 5 people have back pain.The cause of low back pain is rarely dangerous. The pain often gets better over time.About half of people with a sudden onset of back pain feel better in just 2 weeks. About 8 in 10 people feel better by 6 weeks.   CAUSES  Some common causes of back pain include:   Strain of the muscles or ligaments supporting the spine.   Wear and tear (degeneration) of the spinal discs.   Arthritis.   Direct injury to the back.  DIAGNOSIS  Most of the time, the direct cause of low back pain is not known.However, back pain can be treated effectively even when the exact cause of the pain is unknown.Answering your caregiver's questions about your overall health and symptoms is one of the most accurate ways to make sure the cause of your pain is not dangerous. If your caregiver needs more information, he or she may order lab work or imaging tests (X-rays or MRIs).However, even if imaging tests show changes in your back, this usually does not require surgery.  HOME CARE INSTRUCTIONS  For many people, back pain returns.Since low back pain is rarely dangerous, it is often a condition that people can learn to manageon their own.    Remain active. It is stressful on the back to sit or stand in one place. Do not sit, drive, or stand in one place for more than 30 minutes at a time. Take short walks on level surfaces as soon as pain allows.Try to increase the length of time you walk each day.   Do not stay in bed.Resting more than 1 or 2 days can delay your recovery.   Do not avoid exercise or work.Your body is made to move.It is not dangerous to be active, even though your back may hurt.Your back will likely heal faster if you return to being active before your pain is gone.   Pay attention to your body when you bend and lift. Many people have less discomfortwhen lifting if they bend their knees, keep the load close to their bodies,and  avoid twisting. Often, the most comfortable positions are those that put less stress on your recovering back.   Find a comfortable position to sleep. Use a firm mattress and lie on your side with your knees slightly bent. If you lie on your back, put a pillow under your knees.   Only take over-the-counter or prescription medicines as directed by your caregiver. Over-the-counter medicines to reduce pain and inflammation are often the most helpful.Your caregiver may prescribe muscle relaxant drugs.These medicines help dull your pain so you can more quickly return to your normal activities and healthy exercise.   Put ice on the injured area.   Put ice in a plastic bag.   Place a towel between your skin and the bag.   Leave the ice on for 15 to 20 minutes, 3 to 4 times a day for the first 2 to 3 days. After that, ice and heat may be alternated to reduce pain and spasms.   Ask your caregiver about trying back exercises and gentle massage. This may be of some benefit.   Avoid feeling anxious or stressed.Stress increases muscle tension and can worsen back pain.It is important to recognize when you are anxious or stressed and learn ways to manage it.Exercise is a great option.  SEEK MEDICAL CARE IF:   You have pain that is not   relieved with rest or medicine.   You have pain that does not improve in 1 week.   You have new symptoms.   You are generally not feeling well.  SEEK IMMEDIATE MEDICAL CARE IF:    You have pain that radiates from your back into your legs.   You develop new bowel or bladder control problems.   You have unusual weakness or numbness in your arms or legs.   You develop nausea or vomiting.   You develop abdominal pain.   You feel faint.  Document Released: 04/13/2005 Document Revised: 04/02/2011 Document Reviewed: 09/01/2010  ExitCare Patient Information 2012 [employer]  Headache, General, Unknown Cause  The specific cause of your headache may not have been found today. There  are many causes and types of headache. A few common ones are:   Tension headache.   Migraine.   Infections (examples: dental and sinus infections).   Bone and/or joint problems in the neck or jaw.   Depression.   Eye problems.  These headaches are not life threatening.   Headaches can sometimes be diagnosed by a patient history and a physical exam. Sometimes, lab and imaging studies (such as x-ray and/or CT scan) are used to rule out more serious problems. In some cases, a spinal tap (lumbar puncture) may be requested. There are many times when your exam and tests may be normal on the first visit even when there is a serious problem causing your headaches. Because of that, it is very important to follow up with your doctor or local clinic for further evaluation.  FINDING OUT THE RESULTS OF TESTS   If a radiology test was performed, a radiologist will review your results.   You will be contacted by the emergency department or your physician if any test results require a change in your treatment plan.   Not all test results may be available during your visit. If your test results are not back during the visit, make an appointment with your caregiver to find out the results. Do not assume everything is normal if you have not heard from your caregiver or the medical facility. It is important for you to follow up on all of your test results.  HOME CARE INSTRUCTIONS    Keep follow-up appointments with your caregiver, or any specialist referral.   Only take over-the-counter or prescription medicines for pain, discomfort, or fever as directed by your caregiver.   Biofeedback, massage, or other relaxation techniques may be helpful.   Ice packs or heat applied to the head and neck can be used. Do this three to four times per day, or as needed.   Call your doctor if you have any questions or concerns.   If you smoke, you should quit.  SEEK MEDICAL CARE IF:    You develop problems with medications  prescribed.   You do not respond to or obtain relief from medications.   You have a change from the usual headache.   You develop nausea or vomiting.  SEEK IMMEDIATE MEDICAL CARE IF:    If your headache becomes severe.   You have an unexplained oral temperature above 102 F (38.9 C), or as your caregiver suggests.   You have a stiff neck.   You have loss of vision.   You have muscular weakness.   You have loss of muscular control.   You develop severe symptoms different from your first symptoms.   You start losing your balance or have trouble walking.     You feel faint or pass out.  MAKE SURE YOU:    Understand these instructions.   Will watch your condition.   Will get help right away if you are not doing well or get worse.  Document Released: 04/13/2005 Document Revised: 04/02/2011 Document Reviewed: 12/01/2007  Exit[employer] Patient Information 2012 [employer]

## 2011-08-02 NOTE — ED Notes (Signed)
Patient requesting a few Dilantin tablets to "get me to the drug store so I can pick up my prescription for Dilantin that is ready"  Patient angry that MD would not provide him with Dilantin. Patient's chief complaint today was headache and back pain.

## 2011-08-15 ENCOUNTER — Other Ambulatory Visit: Payer: Self-pay

## 2011-08-15 ENCOUNTER — Emergency Department (HOSPITAL_COMMUNITY)
Admission: EM | Admit: 2011-08-15 | Discharge: 2011-08-15 | Disposition: A | Discharge status: Home / Self Care | Payer: PRIVATE HEALTH INSURANCE | Attending: Emergency Medicine | Admitting: Emergency Medicine

## 2011-08-15 ENCOUNTER — Encounter (HOSPITAL_COMMUNITY): Payer: Self-pay | Admitting: Emergency Medicine

## 2011-08-15 DIAGNOSIS — F17200 Nicotine dependence, unspecified, uncomplicated: Secondary | ICD-10-CM | POA: Insufficient documentation

## 2011-08-15 DIAGNOSIS — E119 Type 2 diabetes mellitus without complications: Secondary | ICD-10-CM | POA: Insufficient documentation

## 2011-08-15 DIAGNOSIS — Z79899 Other long term (current) drug therapy: Secondary | ICD-10-CM | POA: Insufficient documentation

## 2011-08-15 DIAGNOSIS — G40909 Epilepsy, unspecified, not intractable, without status epilepticus: Secondary | ICD-10-CM | POA: Insufficient documentation

## 2011-08-15 LAB — POCT I-STAT, CHEM 8
Calcium, Ion: 1.18 mmol/L (ref 1.12–1.32)
HCT: 44 % (ref 39.0–52.0)
Sodium: 142 mEq/L (ref 135–145)
TCO2: 28 mmol/L (ref 0–100)

## 2011-08-15 LAB — PHENYTOIN LEVEL, TOTAL: Phenytoin Lvl: 9.7 ug/mL — ABNORMAL LOW (ref 10.0–20.0)

## 2011-08-15 LAB — VALPROIC ACID LEVEL: Valproic Acid Lvl: 164.8 ug/mL — ABNORMAL HIGH (ref 50.0–100.0)

## 2011-08-15 MED ORDER — LORAZEPAM 2 MG/ML IJ SOLN
2.0000 mg | Freq: Once | INTRAMUSCULAR | Status: AC
  Administered 2011-08-15: 2 mg via INTRAVENOUS

## 2011-08-15 MED ORDER — KETOROLAC TROMETHAMINE 30 MG/ML IJ SOLN
30.0000 mg | Freq: Once | INTRAMUSCULAR | Status: AC
  Administered 2011-08-15: 30 mg via INTRAVENOUS
  Filled 2011-08-15: qty 1

## 2011-08-15 MED ORDER — SODIUM CHLORIDE 0.9 % IV SOLN
500.0000 mg | Freq: Once | INTRAVENOUS | Status: AC
  Administered 2011-08-15: 500 mg via INTRAVENOUS
  Filled 2011-08-15: qty 5

## 2011-08-15 MED ORDER — LORAZEPAM 2 MG/ML IJ SOLN
INTRAMUSCULAR | Status: AC
  Administered 2011-08-15: 2 mg via INTRAVENOUS
  Filled 2011-08-15: qty 1

## 2011-08-15 MED ORDER — PHENYTOIN SODIUM EXTENDED 100 MG PO CAPS
100.0000 mg | ORAL_CAPSULE | Freq: Once | ORAL | Status: AC
  Administered 2011-08-15: 100 mg via ORAL
  Filled 2011-08-15: qty 1

## 2011-08-15 NOTE — ED Notes (Signed)
Pt provided with 3 days of Keppra, to f/u with PCP on Monday

## 2011-08-15 NOTE — ED Notes (Signed)
Pt yelled out "somebody help."  Upon entry to room pt is having grand mal type seizure.  Pt placed on 2L [place] and placed supine.  Pt had seizure lasting . PA called to bedside.  New order for IV Ativan obtained.

## 2011-08-15 NOTE — ED Notes (Signed)
EMS brings pt from home, pt called EMS because he had the ora, once EMS arrived pt had a 2 min long full body seizure. No incontinence noted, vomiting. Pt in post ictal state on arrival to ed.

## 2011-08-15 NOTE — Discharge Instructions (Signed)
Return here as needed. Follow up with your doctor. °

## 2011-08-15 NOTE — ED Provider Notes (Signed)
History     CSN: [id number]  Arrival date & time 08/15/11  6295   First MD Initiated Contact with Patient 08/15/11 [phone]      Chief Complaint  Patient presents with  . Seizures    (Consider location/radiation/quality/duration/timing/severity/associated sxs/prior treatment) HPI Patient presents to the ER with seizure. This happened just prior to arrival. The patient states that he has been out of his Keppra for the last 2 days. The patient states that he had 2 seizures this morning. The patient denies chest pain, SOB, weakness, numbness,dizziness,abd pain,  N/V or fever. The patient is stating that he has a mild left sided headache.  Past Medical History  Diagnosis Date  . Seizures   . Diabetes mellitus     History reviewed. No pertinent past surgical history.  No family history on file.  History  Substance Use Topics  . Smoking status: Current Some Day Smoker    Types: Cigarettes    Last Attempt to Quit: 11/06/2010  . Smokeless tobacco: Never Used  . Alcohol Use: Yes     occ      Review of Systems All pertinent positives and negatives reviewed in the history of present illness  Allergies  Review of patient's allergies indicates no known allergies.  Home Medications   Current Outpatient Rx  Name Route Sig Dispense Refill  . DIVALPROEX SODIUM 500 MG PO TBEC Oral Take 1,500-2,000 mg by mouth 2 (two) times daily. 2000mg  every morning and 1500mg  every evening    . LEVETIRACETAM 500 MG PO TABS Oral Take 500 mg by mouth 2 (two) times daily.    Marland Kitchen METFORMIN HCL 500 MG PO TABS Oral Take 500 mg by mouth daily.     Marland Kitchen PHENYTOIN SODIUM EXTENDED 100 MG PO CAPS Oral Take 100 mg by mouth 3 (three) times daily.       BP 131/113  Pulse 87  Temp 98.8 F (37.1 C)  Resp 16  SpO2 100%  Physical Exam Physical Examination: General appearance - alert, well appearing, and in no distress and oriented to person, place, and time Mental status - alert, oriented to person, place, and  time Eyes - pupils equal and reactive, extraocular eye movements intact Ears - bilateral TM's and external ear canals normal Nose - normal and patent, no erythema, discharge or polyps Mouth - mucous membranes moist, pharynx normal without lesions Chest - clear to auscultation, no wheezes, rales or rhonchi, symmetric air entry Heart - normal rate, regular rhythm, normal S1, S2, no murmurs, rubs, clicks or gallops Neurological - alert, oriented, normal speech, no focal findings or movement disorder noted, neck supple without rigidity, motor and sensory grossly normal bilaterally, normal muscle tone, no tremors, strength 5/5  ED Course  Procedures (including critical care time)  Labs Reviewed  PHENYTOIN LEVEL, TOTAL - Abnormal; Notable for the following:    Phenytoin Lvl 9.7 (*)    All other components within normal limits  VALPROIC ACID LEVEL - Abnormal; Notable for the following:    Valproic Acid Lvl 164.8 (*)    All other components within normal limits  POCT I-STAT, CHEM 8   The patient did have a seizure here in the ER. The patient was given 500mg  Keppra IV.   The patient has been stable since that time and is now awake and has eaten. The patient will be given a 2 day Keppra Supply if possible since he will not be able to fill it till Monday. Will have patient ambulate as  well.    MDM  MDM Reviewed: nursing note and vitals Reviewed previous: labs Interpretation: labs            Carlyle Dolly, PA-C 08/15/11 1530  Carlyle Dolly, PA-C 08/15/11 1531

## 2011-08-17 NOTE — ED Provider Notes (Signed)
Medical screening examination/treatment/procedure(s) were performed by non-physician practitioner and as supervising physician I was immediately available for consultation/collaboration.    Marlynn Hinckley L Triton Heidrich, MD 08/17/11 1055 

## 2011-10-22 ENCOUNTER — Encounter (HOSPITAL_COMMUNITY): Payer: Self-pay | Admitting: Emergency Medicine

## 2011-10-22 ENCOUNTER — Emergency Department (HOSPITAL_COMMUNITY)
Admission: EM | Admit: 2011-10-22 | Discharge: 2011-10-22 | Disposition: A | Discharge status: Home / Self Care | Payer: PRIVATE HEALTH INSURANCE | Attending: Emergency Medicine | Admitting: Emergency Medicine

## 2011-10-22 DIAGNOSIS — E119 Type 2 diabetes mellitus without complications: Secondary | ICD-10-CM | POA: Insufficient documentation

## 2011-10-22 DIAGNOSIS — F17200 Nicotine dependence, unspecified, uncomplicated: Secondary | ICD-10-CM | POA: Insufficient documentation

## 2011-10-22 DIAGNOSIS — R42 Dizziness and giddiness: Secondary | ICD-10-CM | POA: Insufficient documentation

## 2011-10-22 LAB — VALPROIC ACID LEVEL: Valproic Acid Lvl: 42 ug/mL — ABNORMAL LOW (ref 50.0–100.0)

## 2011-10-22 LAB — CBC WITH DIFFERENTIAL/PLATELET
Basophils Absolute: 0 10*3/uL (ref 0.0–0.1)
Eosinophils Relative: 1 % (ref 0–5)
Lymphocytes Relative: 43 % (ref 12–46)
Neutro Abs: 3 10*3/uL (ref 1.7–7.7)
Neutrophils Relative %: 43 % (ref 43–77)
Platelets: 139 10*3/uL — ABNORMAL LOW (ref 150–400)
RDW: 15.2 % (ref 11.5–15.5)
WBC: 6.8 10*3/uL (ref 4.0–10.5)

## 2011-10-22 LAB — COMPREHENSIVE METABOLIC PANEL
ALT: 19 U/L (ref 0–53)
AST: 29 U/L (ref 0–37)
Alkaline Phosphatase: 84 U/L (ref 39–117)
CO2: 26 mEq/L (ref 19–32)
Calcium: 9.1 mg/dL (ref 8.4–10.5)
Chloride: 103 mEq/L (ref 96–112)
GFR calc non Af Amer: >90 mL/min (ref >90)
Potassium: 4.3 mEq/L (ref 3.5–5.1)
Sodium: 140 mEq/L (ref 135–145)

## 2011-10-22 NOTE — ED Provider Notes (Signed)
History     CSN: [id number]  Arrival date & time 10/22/11  1656   First MD Initiated Contact with Patient 10/22/11 1747      Chief Complaint  Patient presents with  . Dizziness     HPI Per ems: pt is afraid he is going to have a seizure. Pt c/o dizziness while when standing. ems states pt was ambulatory on seen.  Past Medical History  Diagnosis Date  . Seizures   . Diabetes mellitus     History reviewed. No pertinent past surgical history.  No family history on file.  History  Substance Use Topics  . Smoking status: Current Some Day Smoker    Types: Cigarettes    Last Attempt to Quit: 11/06/2010  . Smokeless tobacco: Never Used  . Alcohol Use: Yes     occ      Review of Systems  All other systems reviewed and are negative.    Allergies  Review of patient's allergies indicates no known allergies.  Home Medications   Current Outpatient Rx  Name Route Sig Dispense Refill  . DIVALPROEX SODIUM 500 MG PO TBEC Oral Take 1,500-2,000 mg by mouth 2 (two) times daily. 2000mg  every morning and 1500mg  every evening    . LEVETIRACETAM 500 MG PO TABS Oral Take 500 mg by mouth 2 (two) times daily.    Marland Kitchen METFORMIN HCL 500 MG PO TABS Oral Take 500 mg by mouth daily.     Marland Kitchen PHENYTOIN SODIUM EXTENDED 100 MG PO CAPS Oral Take 200 mg by mouth daily.    Marland Kitchen PHENYTOIN 50 MG PO CHEW Oral Chew 50 mg by mouth at bedtime.      BP 115/87  Pulse 69  Temp 98.1 F (36.7 C) (Oral)  Resp 20  Ht 5\' 5"  (1.651 m)  Wt 138 lb 0.1 oz (62.6 kg)  BMI 22.97 kg/m2  SpO2 99%  Physical Exam  Nursing note and vitals reviewed. Constitutional: He is oriented to person, place, and time. He appears well-developed and well-nourished. No distress.  HENT:  Head: Normocephalic and atraumatic.  Eyes: Pupils are equal, round, and reactive to light.  Neck: Normal range of motion.  Cardiovascular: Normal rate and intact distal pulses.   Pulmonary/Chest: No respiratory distress.  Abdominal: Normal  appearance. He exhibits no distension. There is no tenderness. There is no rebound and no guarding.  Musculoskeletal: Normal range of motion.  Neurological: He is alert and oriented to person, place, and time. No cranial nerve deficit.  Skin: Skin is warm and dry. No rash noted.  Psychiatric: He has a normal mood and affect. His behavior is normal.    ED Course  Procedures (including critical care time)  Labs Reviewed  CBC WITH DIFFERENTIAL - Abnormal; Notable for the following:    Platelets 139 (*)     Monocytes Relative 13 (*)     All other components within normal limits  COMPREHENSIVE METABOLIC PANEL - Abnormal; Notable for the following:    BUN 26 (*)     Total Bilirubin 0.2 (*)     All other components within normal limits  VALPROIC ACID LEVEL - Abnormal; Notable for the following:    Valproic Acid Lvl 42.0 (*)     All other components within normal limits  PHENYTOIN LEVEL, FREE   No results found.   1. Dizziness       MDM  After treatment in the ED the patient feels back to baseline and wants to go home.  Nelia Shi, MD 10/22/11 2132

## 2011-10-22 NOTE — Discharge Instructions (Signed)

## 2011-10-22 NOTE — ED Notes (Addendum)
Pt states he was doing some work today and felt fine-took nap and woke up dizzy-states last seizure was 6 months ago

## 2011-10-22 NOTE — ED Notes (Signed)
Per ems: pt is afraid he is going to have a seizure. Pt c/o dizziness while when standing. ems states pt was ambulatory on seen.

## 2011-10-22 NOTE — ED Notes (Signed)
Pt providing blood specimen for valproic acid level. States last Sz was approximately 9 months ago. States his PCP lowered his dosage without checking his levels prior. States he felt his normal arua and called ems.

## 2011-10-27 LAB — PHENYTOIN LEVEL, FREE AND TOTAL: Phenytoin Bound: 10.5 mg/L

## 2011-12-13 ENCOUNTER — Encounter (HOSPITAL_COMMUNITY): Payer: Self-pay

## 2011-12-13 ENCOUNTER — Emergency Department (HOSPITAL_COMMUNITY)
Admission: EM | Admit: 2011-12-13 | Discharge: 2011-12-13 | Disposition: A | Discharge status: Home / Self Care | Payer: PRIVATE HEALTH INSURANCE | Attending: Emergency Medicine | Admitting: Emergency Medicine

## 2011-12-13 DIAGNOSIS — F17200 Nicotine dependence, unspecified, uncomplicated: Secondary | ICD-10-CM | POA: Insufficient documentation

## 2011-12-13 DIAGNOSIS — E119 Type 2 diabetes mellitus without complications: Secondary | ICD-10-CM | POA: Insufficient documentation

## 2011-12-13 DIAGNOSIS — R569 Unspecified convulsions: Secondary | ICD-10-CM | POA: Insufficient documentation

## 2011-12-13 DIAGNOSIS — H5440 Blindness, one eye, unspecified eye: Secondary | ICD-10-CM | POA: Insufficient documentation

## 2011-12-13 HISTORY — DX: Blindness, one eye, unspecified eye: H54.40

## 2011-12-13 LAB — PHENYTOIN LEVEL, TOTAL: Phenytoin Lvl: 8.6 ug/mL — ABNORMAL LOW (ref 10.0–20.0)

## 2011-12-13 LAB — VALPROIC ACID LEVEL: Valproic Acid Lvl: 103.1 ug/mL — ABNORMAL HIGH (ref 50.0–100.0)

## 2011-12-13 MED ORDER — SODIUM CHLORIDE 0.9 % IV SOLN
Freq: Once | INTRAVENOUS | Status: AC
  Administered 2011-12-13: 10:00:00 via INTRAVENOUS

## 2011-12-13 MED ORDER — ACETAMINOPHEN 325 MG PO TABS
650.0000 mg | ORAL_TABLET | Freq: Once | ORAL | Status: AC
  Administered 2011-12-13: 650 mg via ORAL
  Filled 2011-12-13: qty 2

## 2011-12-13 MED ORDER — LEVETIRACETAM 500 MG PO TABS: 500.0000 mg | ORAL_TABLET | Freq: Two times a day (BID) | ORAL | Status: DC

## 2011-12-13 MED ORDER — LEVETIRACETAM 500 MG PO TABS
500.0000 mg | ORAL_TABLET | Freq: Once | ORAL | Status: AC
  Administered 2011-12-13: 500 mg via ORAL
  Filled 2011-12-13 (×2): qty 1

## 2011-12-13 MED ORDER — PHENYTOIN SODIUM EXTENDED 100 MG PO CAPS
100.0000 mg | ORAL_CAPSULE | Freq: Once | ORAL | Status: AC
  Administered 2011-12-13: 100 mg via ORAL
  Filled 2011-12-13: qty 1

## 2011-12-13 MED ORDER — LORAZEPAM 2 MG/ML IJ SOLN
1.0000 mg | Freq: Once | INTRAMUSCULAR | Status: AC
  Administered 2011-12-13: 1 mg via INTRAVENOUS
  Filled 2011-12-13: qty 1

## 2011-12-13 NOTE — ED Notes (Signed)
He is shown to our lobby at this time with our tech.  He ambulates without difficulty and is in no distress.

## 2011-12-13 NOTE — ED Notes (Signed)
He is awake and drowsy.  I give him some apple juice per his request.  His skin is normal, warm and dry and he is breathing normally.  He moves all extremities with ease on command.

## 2011-12-13 NOTE — ED Notes (Signed)
Went to collect labs - pt has very small veins - blood too slow coming out.  Talked to RN- will let fluids run for about 30 min and try again.

## 2011-12-13 NOTE — ED Notes (Signed)
ZOX:WR60<AV> Expected date:12/13/11<BR> Expected time: 9:53 AM<BR> Means of arrival:Ambulance<BR> Comments:<BR> Seizures

## 2011-12-13 NOTE — ED Provider Notes (Signed)
History     CSN: [id number]  Arrival date & time 12/13/11  4098   First MD Initiated Contact with Patient 12/13/11 [phone]      Chief Complaint  Patient presents with  . Seizure     (Consider location/radiation/quality/duration/timing/severity/associated sxs/prior treatment) HPI Is a 56 year old black male with history of seizures. He is on Dilantin, Depakote and Keppra. He states he has run out of his Keppra. He apparently had a seizure this morning as he found himself waking up on the floor this morning just prior to arrival. He complains of headache general malaise and neck soreness. He attributes the seizure to being out of Keppra. He denies nausea or vomiting. 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  Past Medical History  Diagnosis Date  . Seizures   . Diabetes mellitus     No past surgical history on file.  No family history on file.  History  Substance Use Topics  . Smoking status: Current Some Day Smoker    Types: Cigarettes    Last Attempt to Quit: 11/06/2010  . Smokeless tobacco: Never Used  . Alcohol Use: Yes     occ      Review of Systems  All other systems reviewed and are negative.    Allergies  Review of patient's allergies indicates no known allergies.  Home Medications   Current Outpatient Rx  Name Route Sig Dispense Refill  . DIVALPROEX SODIUM 500 MG PO TBEC Oral Take 1,500-2,000 mg by mouth 2 (two) times daily. 2000mg  every morning and 1500mg  every evening    . LEVETIRACETAM 500 MG PO TABS Oral Take 500 mg by mouth 2 (two) times daily.    Marland Kitchen METFORMIN HCL 500 MG PO TABS Oral Take 500 mg by mouth daily.     Marland Kitchen PHENYTOIN SODIUM EXTENDED 100 MG PO CAPS Oral Take 200 mg by mouth daily.    Marland Kitchen PHENYTOIN 50 MG PO CHEW Oral Chew 50 mg by mouth at bedtime.      BP 156/87  Pulse 79  Temp 97.9 F (36.6 C) (Oral)  Resp 18  SpO2 100%  Physical Exam General: Well-developed, well-nourished male in no acute distress; appearance consistent with age of  record HENT: normocephalic, atraumatic Eyes: Blind right eye; left eye pupil round and reactive to light, extraocular muscles intact, arcus senilis Neck: supple Heart: regular rate and rhythm Lungs: clear to auscultation bilaterally Abdomen: soft; nondistended; nontender; bowel sounds present Extremities: No deformity; full range of motion; pulses normal; no Neurologic: Awake, alert and oriented; motor function intact in all extremities and symmetric; no facial droop Skin: Warm and dry Psychiatric: Flat affect    ED Course  Procedures (including critical care time)     MDM   Nursing notes and vitals signs, including pulse oximetry, reviewed.  Summary of this visit's results, reviewed by myself:  Labs:  Results for orders placed during the hospital encounter of 12/13/11  PHENYTOIN LEVEL, TOTAL      Component Value Range   Phenytoin Lvl 8.6 (*) 10.0 - 20.0 ug/mL  VALPROIC ACID LEVEL      Component Value Range   Valproic Acid Lvl 103.1 (*) 50.0 - 100.0 ug/mL            Hanley Seamen, MD 12/13/11 1323

## 2011-12-13 NOTE — ED Notes (Signed)
At the time we started his IV and gave the IV ativan he exhibited seizure-like activity during which he was able to follow [place] with his gaze, and verbalize.

## 2011-12-13 NOTE — ED Notes (Signed)
He tells [place] that he found himself to be "on the floor" not remembering how he got there.  He states that he had a seizure; and that he has recently run out of his Keppra.  He is currently awake, alert and oriented x 3 with slow and clear speech.  He c/o generalized aches, plus mild generalized h/a.  His skin is normal, warm and dry and he is breathing normally,.

## 2011-12-14 ENCOUNTER — Emergency Department (HOSPITAL_COMMUNITY)
Admission: EM | Admit: 2011-12-14 | Discharge: 2011-12-14 | Disposition: A | Discharge status: Home / Self Care | Payer: PRIVATE HEALTH INSURANCE | Attending: Emergency Medicine | Admitting: Emergency Medicine

## 2011-12-14 ENCOUNTER — Encounter (HOSPITAL_COMMUNITY): Payer: Self-pay | Admitting: Emergency Medicine

## 2011-12-14 DIAGNOSIS — H5440 Blindness, one eye, unspecified eye: Secondary | ICD-10-CM | POA: Insufficient documentation

## 2011-12-14 DIAGNOSIS — E119 Type 2 diabetes mellitus without complications: Secondary | ICD-10-CM | POA: Insufficient documentation

## 2011-12-14 DIAGNOSIS — M79641 Pain in right hand: Secondary | ICD-10-CM

## 2011-12-14 DIAGNOSIS — F17200 Nicotine dependence, unspecified, uncomplicated: Secondary | ICD-10-CM | POA: Insufficient documentation

## 2011-12-14 DIAGNOSIS — M79609 Pain in unspecified limb: Secondary | ICD-10-CM | POA: Insufficient documentation

## 2011-12-14 LAB — GLUCOSE, CAPILLARY

## 2011-12-14 NOTE — ED Notes (Signed)
Pt told to return to ED if s/s worsen or do not resolve.  Pt verbalized understanding of all d/c instructions

## 2011-12-14 NOTE — ED Notes (Signed)
R/hand swollen and tender.Pt c/o pain and swelling in r/hand , starting 12 hrs after he was seen in ED and received IV fluids in r/hand

## 2011-12-14 NOTE — ED Notes (Signed)
ZOX:WR60<AV> Expected date:12/14/11<BR> Expected time:10:02 AM<BR> Means of arrival:Ambulance<BR> Comments:<BR> Generalized weakness

## 2011-12-14 NOTE — ED Notes (Signed)
Caregiver Stated pt. Money hungry ,will do anything for money

## 2011-12-14 NOTE — ED Notes (Signed)
Went to get pt.cargiver from lobby,pt. Ask for something to drink

## 2011-12-14 NOTE — ED Notes (Signed)
MD to discontinue orders.

## 2011-12-16 NOTE — ED Provider Notes (Signed)
History     CSN: [id number]  Arrival date & time 12/14/11  4098   First MD Initiated Contact with Patient 12/14/11 1003      Chief Complaint  Patient presents with  . Hand Pain  . Arm Swelling    pt  c/o pain and swelling in r/hand    (Consider location/radiation/quality/duration/timing/severity/associated sxs/prior treatment) HPI Comments: [name] 56 y.o. male   The chief complaint is: Patient presents with:   Hand Pain   Arm Swelling - pt  c/o pain and swelling in r/hand    Patient c/o r hand pain and swelling.  Noticed pain and sweilling last night after d/c form ed/.  Had blood draw form the right hand.  Pain is 4/10. Worse with movement, palpation, better with rest. Denies paresthesias. Denies fevers, chills, myalgias, arthralgias, nausea, vomiting, diarrhea. Denies discharge.  Denies heat or warmth. Denies, CP, SOB, abdominal pain.     Patient is a 56 y.o. male presenting with hand pain. The history is provided by the patient and medical records.  Hand Pain The current episode started yesterday. The problem occurs constantly. The problem has been gradually worsening. Pertinent negatives include no abdominal pain, arthralgias, chest pain, chills, coughing, fever, headaches, joint swelling, myalgias, numbness, rash or vomiting. The symptoms are aggravated by bending. He has tried nothing for the symptoms.    Past Medical History  Diagnosis Date  . Seizures   . Diabetes mellitus   . Blindness of right eye     History reviewed. No pertinent past surgical history.  Family History  Problem Relation Age of Onset  . Diabetes Mother   . Hypertension Mother     History  Substance Use Topics  . Smoking status: Current Some Day Smoker    Types: Cigarettes    Last Attempt to Quit: 11/06/2010  . Smokeless tobacco: Never Used  . Alcohol Use: Yes     occ      Review of Systems  Constitutional: Negative for fever and chills.  Respiratory: Negative for  cough and shortness of breath.   Cardiovascular: Negative for chest pain and palpitations.  Gastrointestinal: Negative for vomiting, abdominal pain, diarrhea and constipation.  Genitourinary: Negative for dysuria, urgency and frequency.  Musculoskeletal: Negative for myalgias, joint swelling and arthralgias.  Skin: Negative for rash.  Neurological: Negative for numbness and headaches.    Allergies  Review of patient's allergies indicates no known allergies.  Home Medications   Current Outpatient Rx  Name Route Sig Dispense Refill  . DIVALPROEX SODIUM 500 MG PO TBEC Oral Take 1,500-2,000 mg by mouth 2 (two) times daily. 2000mg  every morning and 1500mg  every evening    . LEVETIRACETAM 500 MG PO TABS Oral Take 500 mg by mouth 2 (two) times daily.    Marland Kitchen METFORMIN HCL 500 MG PO TABS Oral Take 500 mg by mouth daily.     Marland Kitchen PHENYTOIN SODIUM EXTENDED 100 MG PO CAPS Oral Take 200 mg by mouth daily.    Marland Kitchen PHENYTOIN 50 MG PO CHEW Oral Chew 50 mg by mouth at bedtime.      BP 120/8  Pulse 70  Temp 98.2 F (36.8 C) (Oral)  Resp 20  SpO2 100%  Physical Exam  Nursing note and vitals reviewed. Constitutional: He appears well-developed and well-nourished. No distress.  HENT:  Head: Normocephalic and atraumatic.  Eyes: Conjunctivae are normal. No scleral icterus.  Neck: Normal range of motion. Neck supple.  Cardiovascular: Normal rate, regular rhythm and normal  heart sounds.   Pulmonary/Chest: Effort normal and breath sounds normal. No respiratory distress.  Abdominal: Soft. There is no tenderness.  Musculoskeletal: He exhibits edema.       Right hand is swollen and tender.  Patient ROM intact in fingers and wrist, but limited by pain and edema.  Neurovascularly intact.  No D/C, No drainage. No heat. Palpable cordlike vain on dorsal surface of r hand swelling to mid forarm.  No pain in elbow, FROM.  Neurological: He is alert.  Skin: Skin is warm and dry. He is not diaphoretic.  Psychiatric: His  behavior is normal.    ED Course  Procedures (including critical care time)  Labs Reviewed  GLUCOSE, CAPILLARY - Abnormal; Notable for the following:    Glucose-Capillary 168 (*)     All other components within normal limits  LAB REPORT - SCANNED   No results found.  BP 120/8  Pulse 70  Temp 98.2 F (36.8 C) (Oral)  Resp 20  SpO2 100% DDX Phlebitis, Hematoma Cellulitis  I believe the patient likely has a mild phlebitis or hematoma.  No heat present, redness difficult to appreiciate due to skin tone.  I asked Dr. Ranae Palms to see the patient. He agrees with assessment and does not feel that labs or imaging are necessary at this time.  I will DC patient with supportive care instructions.  Discussed reasons to return immediately  1. Hand pain, right       MDM  D/C paitient. Discussed reasons to seek immediate care. Patient expresses understanding and agrees with plan.        Arthor Captain, PA-C 12/16/11 1148

## 2011-12-17 NOTE — ED Provider Notes (Signed)
Medical screening examination/treatment/procedure(s) were performed by non-physician practitioner and as supervising physician I was immediately available for consultation/collaboration.   Nadalyn Deringer, MD 12/17/11 0624 

## 2012-01-25 ENCOUNTER — Encounter (HOSPITAL_COMMUNITY): Payer: Self-pay | Admitting: Emergency Medicine

## 2012-01-25 ENCOUNTER — Emergency Department (HOSPITAL_COMMUNITY): Payer: PRIVATE HEALTH INSURANCE

## 2012-01-25 ENCOUNTER — Emergency Department (HOSPITAL_COMMUNITY)
Admission: EM | Admit: 2012-01-25 | Discharge: 2012-01-25 | Disposition: A | Discharge status: Home / Self Care | Payer: PRIVATE HEALTH INSURANCE | Attending: Emergency Medicine | Admitting: Emergency Medicine

## 2012-01-25 DIAGNOSIS — Z833 Family history of diabetes mellitus: Secondary | ICD-10-CM | POA: Insufficient documentation

## 2012-01-25 DIAGNOSIS — F17200 Nicotine dependence, unspecified, uncomplicated: Secondary | ICD-10-CM | POA: Insufficient documentation

## 2012-01-25 DIAGNOSIS — Z8249 Family history of ischemic heart disease and other diseases of the circulatory system: Secondary | ICD-10-CM | POA: Insufficient documentation

## 2012-01-25 DIAGNOSIS — R109 Unspecified abdominal pain: Secondary | ICD-10-CM

## 2012-01-25 DIAGNOSIS — E119 Type 2 diabetes mellitus without complications: Secondary | ICD-10-CM | POA: Insufficient documentation

## 2012-01-25 LAB — COMPREHENSIVE METABOLIC PANEL
AST: 29 U/L (ref 0–37)
BUN: 15 mg/dL (ref 6–23)
CO2: 27 mEq/L (ref 19–32)
Calcium: 9.2 mg/dL (ref 8.4–10.5)
Creatinine, Ser: 0.84 mg/dL (ref 0.50–1.35)
GFR calc Af Amer: >90 mL/min (ref >90)
GFR calc non Af Amer: >90 mL/min (ref >90)
Glucose, Bld: 86 mg/dL (ref 70–99)

## 2012-01-25 LAB — URINALYSIS, ROUTINE W REFLEX MICROSCOPIC
Ketones, ur: 15 mg/dL — AB
Leukocytes, UA: NEGATIVE
Nitrite: NEGATIVE
Protein, ur: NEGATIVE mg/dL
Urobilinogen, UA: 1 mg/dL (ref 0.0–1.0)

## 2012-01-25 LAB — CBC WITH DIFFERENTIAL/PLATELET
Basophils Absolute: 0 10*3/uL (ref 0.0–0.1)
Basophils Relative: 0 % (ref 0–1)
Eosinophils Relative: 1 % (ref 0–5)
Lymphocytes Relative: 44 % (ref 12–46)
MCHC: 33.8 g/dL (ref 30.0–36.0)
Neutro Abs: 3.1 10*3/uL (ref 1.7–7.7)
Platelets: 177 10*3/uL (ref 150–400)
RDW: 14.2 % (ref 11.5–15.5)
WBC: 6.5 10*3/uL (ref 4.0–10.5)

## 2012-01-25 LAB — VALPROIC ACID LEVEL: Valproic Acid Lvl: 109 ug/mL — ABNORMAL HIGH (ref 50.0–100.0)

## 2012-01-25 LAB — ETHANOL: Alcohol, Ethyl (B): <11 mg/dL (ref 0–11)

## 2012-01-25 LAB — LIPASE, BLOOD: Lipase: 26 U/L (ref 11–59)

## 2012-01-25 MED ORDER — ONDANSETRON HCL 4 MG/2ML IJ SOLN
4.0000 mg | Freq: Once | INTRAMUSCULAR | Status: AC
  Administered 2012-01-25: 4 mg via INTRAVENOUS
  Filled 2012-01-25: qty 2

## 2012-01-25 MED ORDER — IOHEXOL 300 MG/ML  SOLN: 80.0000 mL | Freq: Once | INTRAMUSCULAR | Status: DC | PRN

## 2012-01-25 MED ORDER — SODIUM CHLORIDE 0.9 % IV SOLN
1000.0000 mL | INTRAVENOUS | Status: DC
  Administered 2012-01-25: 1000 mL via INTRAVENOUS

## 2012-01-25 MED ORDER — IOHEXOL 300 MG/ML  SOLN
20.0000 mL | INTRAMUSCULAR | Status: AC
  Administered 2012-01-25: 20 mL via ORAL

## 2012-01-25 MED ORDER — SODIUM CHLORIDE 0.9 % IV SOLN
1000.0000 mL | Freq: Once | INTRAVENOUS | Status: AC
  Administered 2012-01-25: 1000 mL via INTRAVENOUS

## 2012-01-25 MED ORDER — MORPHINE SULFATE 4 MG/ML IJ SOLN
4.0000 mg | Freq: Once | INTRAMUSCULAR | Status: AC
  Administered 2012-01-25: 4 mg via INTRAVENOUS
  Filled 2012-01-25: qty 1

## 2012-01-25 MED ORDER — ACETAMINOPHEN 325 MG PO TABS
650.0000 mg | ORAL_TABLET | Freq: Once | ORAL | Status: AC
  Administered 2012-01-25: 650 mg via ORAL
  Filled 2012-01-25: qty 2

## 2012-01-25 NOTE — ED Notes (Signed)
Patient is drinking contrast for his CT.

## 2012-01-25 NOTE — ED Notes (Signed)
Called EMS for RLQ and LLQ abdominal pain today after drinking orange juice 4/10 achy with loose stools for one week. Seen recently at another hospital recently for same symptoms. EMS reported smell of ETOH denied drinking ETOH today to EMS. Ax4.

## 2012-01-25 NOTE — ED Notes (Signed)
Notified CT patient finished oral contrast

## 2012-01-25 NOTE — ED Provider Notes (Signed)
History     CSN: [id number]  Arrival date & time 01/25/12  4098   First MD Initiated Contact with Patient 01/25/12 [phone]      Chief Complaint  Patient presents with  . Abdominal Pain    (Consider location/radiation/quality/duration/timing/severity/associated sxs/prior treatment) HPI Comments: Pt began having pain in his lower abdomen this am around his umbilicus.  Patient is a 56 y.o. male presenting with abdominal pain. The history is provided by the patient.  Abdominal Pain The primary symptoms of the illness include abdominal pain and diarrhea (loose stool, twice, watery). The primary symptoms of the illness do not include fever, shortness of breath, vomiting or dysuria. The onset of the illness was gradual.  The abdominal pain has been gradually worsening since its onset. The abdominal pain is located in the periumbilical region. The abdominal pain does not radiate. The abdominal pain is relieved by nothing. Exacerbated by: tried to drink juice, did not make it better or worse.  The patient has had a change in bowel habit. Symptoms associated with the illness do not include chills, anorexia, urgency, frequency or back pain.  No abdominal surgeries in the past.  Past Medical History  Diagnosis Date  . Seizures   . Diabetes mellitus   . Blindness of right eye     History reviewed. No pertinent past surgical history.  Family History  Problem Relation Age of Onset  . Diabetes Mother   . Hypertension Mother     History  Substance Use Topics  . Smoking status: Current Some Day Smoker    Types: Cigarettes    Last Attempt to Quit: 11/06/2010  . Smokeless tobacco: Never Used  . Alcohol Use: Yes     occ      Review of Systems  Constitutional: Negative for fever and chills.  Respiratory: Negative for shortness of breath.   Cardiovascular: Negative for chest pain.  Gastrointestinal: Positive for abdominal pain and diarrhea (loose stool, twice, watery). Negative for  vomiting and anorexia.  Genitourinary: Negative for dysuria, urgency and frequency.  Musculoskeletal: Negative for back pain.  Neurological: Positive for light-headedness and headaches.    Allergies  Review of patient's allergies indicates no known allergies.  Home Medications   Current Outpatient Rx  Name Route Sig Dispense Refill  . DIVALPROEX SODIUM 500 MG PO TBEC Oral Take 1,500-2,000 mg by mouth 2 (two) times daily. 2000mg  every morning and 1500mg  every evening    . LEVETIRACETAM 500 MG PO TABS Oral Take 500 mg by mouth 2 (two) times daily.    Marland Kitchen METFORMIN HCL 500 MG PO TABS Oral Take 500 mg by mouth daily.     Marland Kitchen PHENYTOIN SODIUM EXTENDED 100 MG PO CAPS Oral Take 200 mg by mouth daily.    Marland Kitchen PHENYTOIN 50 MG PO CHEW Oral Chew 50 mg by mouth at bedtime.      BP 115/74  Pulse 68  Temp 98.8 F (37.1 C) (Oral)  Resp 18  SpO2 96%  Physical Exam  Nursing note and vitals reviewed. Constitutional: He appears well-developed and well-nourished. No distress.  HENT:  Head: Normocephalic and atraumatic.  Right Ear: External ear normal.  Left Ear: External ear normal.  Mouth/Throat: No oropharyngeal exudate.  Eyes: Conjunctivae normal are normal. Right eye exhibits no discharge. Left eye exhibits no discharge. No scleral icterus.  Neck: Neck supple. No tracheal deviation present.  Cardiovascular: Normal rate, regular rhythm and intact distal pulses.   Pulmonary/Chest: Effort normal and breath sounds normal. No stridor.  No respiratory distress. He has no wheezes. He has no rales.  Abdominal: Soft. Bowel sounds are normal. He exhibits no distension and no mass. There is no tenderness. There is no rebound and no guarding.  Musculoskeletal: He exhibits no edema and no tenderness.  Neurological: He is alert. He has normal strength. No sensory deficit. Cranial nerve deficit:  no gross defecits noted. He exhibits normal muscle tone. He displays no seizure activity. Coordination normal.  Skin:  Skin is warm and dry. No rash noted.  Psychiatric: He has a normal mood and affect.    ED Course  Procedures (including critical care time)  Labs Reviewed  COMPREHENSIVE METABOLIC PANEL - Abnormal; Notable for the following:    Total Bilirubin 0.2 (*)     All other components within normal limits  URINALYSIS, ROUTINE W REFLEX MICROSCOPIC - Abnormal; Notable for the following:    Specific Gravity, Urine 1.035 (*)     Ketones, ur 15 (*)     All other components within normal limits  PHENYTOIN LEVEL, TOTAL - Abnormal; Notable for the following:    Phenytoin Lvl 6.8 (*)     All other components within normal limits  VALPROIC ACID LEVEL - Abnormal; Notable for the following:    Valproic Acid Lvl 109.0 (*)     All other components within normal limits  LIPASE, BLOOD  CBC WITH DIFFERENTIAL  ETHANOL   No results found.    MDM  Initial labs did not show any acute abnormality to account for the patient's symptoms.  His Dilantin level is low and his valproic acid level is slightly elevated but this does not seem to be significantly different than baseline.  The patient's white blood cell count is unremarkable he still having pain. We'll CT his abdomen to exclude any other sort of acute pathology.  If negative he can be discharged home at that time.        Celene Kras, MD 01/25/12 [phone]

## 2012-01-25 NOTE — ED Notes (Signed)
Patient has a strong smell of alcohol from his breath.

## 2012-01-25 NOTE — ED Provider Notes (Signed)
The patient declined CT scan as he was not comfortable with the "tube". Reasessment:  Abd. Completely nontender. He states he is feeling betting. He has a headache and was offered Tylenol which he was happy with.   Rodena Medin, PA-C 01/25/12 1542

## 2012-03-12 ENCOUNTER — Observation Stay (HOSPITAL_COMMUNITY)
Admission: EM | Admit: 2012-03-12 | Discharge: 2012-03-12 | Disposition: A | Discharge status: Home / Self Care | Payer: PRIVATE HEALTH INSURANCE | Attending: Emergency Medicine | Admitting: Emergency Medicine

## 2012-03-12 ENCOUNTER — Encounter (HOSPITAL_COMMUNITY): Payer: Self-pay | Admitting: Emergency Medicine

## 2012-03-12 ENCOUNTER — Observation Stay (HOSPITAL_COMMUNITY): Payer: PRIVATE HEALTH INSURANCE

## 2012-03-12 DIAGNOSIS — M5416 Radiculopathy, lumbar region: Secondary | ICD-10-CM

## 2012-03-12 DIAGNOSIS — H5440 Blindness, one eye, unspecified eye: Secondary | ICD-10-CM | POA: Insufficient documentation

## 2012-03-12 DIAGNOSIS — M545 Low back pain, unspecified: Secondary | ICD-10-CM | POA: Insufficient documentation

## 2012-03-12 DIAGNOSIS — F17200 Nicotine dependence, unspecified, uncomplicated: Secondary | ICD-10-CM | POA: Insufficient documentation

## 2012-03-12 DIAGNOSIS — E119 Type 2 diabetes mellitus without complications: Secondary | ICD-10-CM | POA: Insufficient documentation

## 2012-03-12 DIAGNOSIS — Z79899 Other long term (current) drug therapy: Secondary | ICD-10-CM | POA: Insufficient documentation

## 2012-03-12 DIAGNOSIS — G40909 Epilepsy, unspecified, not intractable, without status epilepticus: Secondary | ICD-10-CM | POA: Insufficient documentation

## 2012-03-12 DIAGNOSIS — IMO0002 Reserved for concepts with insufficient information to code with codable children: Principal | ICD-10-CM | POA: Insufficient documentation

## 2012-03-12 MED ORDER — METHOCARBAMOL 100 MG/ML IJ SOLN
1000.0000 mg | Freq: Once | INTRAVENOUS | Status: AC
  Administered 2012-03-12: 1000 mg via INTRAVENOUS
  Filled 2012-03-12: qty 10

## 2012-03-12 MED ORDER — OXYCODONE-ACETAMINOPHEN 5-325 MG PO TABS: 1.0000 | ORAL_TABLET | ORAL | Status: DC | PRN

## 2012-03-12 MED ORDER — DIAZEPAM 5 MG PO TABS: 5.0000 mg | ORAL_TABLET | Freq: Four times a day (QID) | ORAL | Status: DC | PRN

## 2012-03-12 MED ORDER — SODIUM CHLORIDE 0.9 % IV SOLN
Freq: Once | INTRAVENOUS | Status: AC
  Administered 2012-03-12: 18:00:00 via INTRAVENOUS

## 2012-03-12 MED ORDER — MORPHINE SULFATE 4 MG/ML IJ SOLN
4.0000 mg | Freq: Once | INTRAMUSCULAR | Status: AC
  Administered 2012-03-12: 4 mg via INTRAMUSCULAR
  Filled 2012-03-12: qty 1

## 2012-03-12 MED ORDER — DIAZEPAM 5 MG PO TABS: 5.0000 mg | ORAL_TABLET | Freq: Three times a day (TID) | ORAL | Status: DC | PRN

## 2012-03-12 MED ORDER — METHOCARBAMOL 100 MG/ML IJ SOLN
1000.0000 mg | Freq: Once | INTRAMUSCULAR | Status: DC
  Filled 2012-03-12: qty 10

## 2012-03-12 MED ORDER — ONDANSETRON 4 MG PO TBDP
4.0000 mg | ORAL_TABLET | Freq: Once | ORAL | Status: AC
  Administered 2012-03-12: 4 mg via ORAL
  Filled 2012-03-12: qty 1

## 2012-03-12 MED ORDER — DIAZEPAM 5 MG PO TABS
5.0000 mg | ORAL_TABLET | Freq: Once | ORAL | Status: AC
  Administered 2012-03-12: 5 mg via ORAL
  Filled 2012-03-12: qty 1

## 2012-03-12 MED ORDER — DEXAMETHASONE SODIUM PHOSPHATE 10 MG/ML IJ SOLN
10.0000 mg | Freq: Once | INTRAMUSCULAR | Status: AC
  Administered 2012-03-12: 10 mg via INTRAMUSCULAR
  Filled 2012-03-12: qty 1

## 2012-03-12 MED ORDER — ACETAMINOPHEN 325 MG PO TABS
975.0000 mg | ORAL_TABLET | Freq: Once | ORAL | Status: AC
  Administered 2012-03-12: 975 mg via ORAL
  Filled 2012-03-12: qty 3

## 2012-03-12 MED ORDER — OXYCODONE-ACETAMINOPHEN 5-325 MG PO TABS
2.0000 | ORAL_TABLET | Freq: Once | ORAL | Status: AC
  Administered 2012-03-12: 2 via ORAL
  Filled 2012-03-12: qty 2

## 2012-03-12 MED ORDER — MORPHINE SULFATE 4 MG/ML IJ SOLN: 4.0000 mg | INTRAMUSCULAR | Status: DC | PRN

## 2012-03-12 NOTE — ED Notes (Signed)
Patient moved to CDU 5 report given to [facility]

## 2012-03-12 NOTE — ED Notes (Signed)
Ambulated patient per PA Irving Burton Request / Notified PA with patient status

## 2012-03-12 NOTE — ED Provider Notes (Signed)
3:36 PM Patient is in CDU under observation, back pain protocol.  This is a shared visit with Dr Effie Shy. Sign out received from Remi Haggard, NP.  Pt with right sided sciatic pain that is new.  No neurological deficits on exam.  DDD on xray.  Plan is for pain control, ambulation, d/c home with pain medication.  PCP follow up.   4:52 PM Patient reports his pain is currently 3/10, states he would like to attempt to ambulate.    5:08 PM Pt was able to ambulate with 5/10 pain and heavy assistance from tech.  Pt does have a cane he walks with.  I have offered him a prescription for a walker, which he has refused.    5:55 PM Patient does not feel comfortable and safe ambulating on his own yet.  Will have RN place IV and order additional medications.    8:29 PM Patient reports improvement in his pain, would like to try ambulating again.  Nurse to assist.    8:39 PM Patient was able to walk unassisted, even without cane.  Plan is for d/c home.   Discussed all results with patient.  Pt verbalizes understanding and agrees with plan.  Pt given return precautions.   Dg Lumbar Spine Complete  03/12/2012  *RADIOLOGY REPORT*  Clinical Data: Back pain.  LUMBAR SPINE - COMPLETE 4+ VIEW  Comparison: No priors.  Findings: Five views of the lumbar spine demonstrate no definite acute displaced fracture or compression type fracture.  Mild multilevel degenerative disc disease and facet arthropathy is noted. Incomplete fusion of the posterior spinal elements at the level of L4 is incidentally noted (normal anatomical variant).  No definite defects of the pars interarticularis.  IMPRESSION: 1.  No acute radiographic abnormality of the lumbar spine to account for the patient's symptoms. 2.  Mild multilevel degenerative disc disease and lumbar spondylosis.   Original Report Authenticated By: Trudie Reed, M.D.       [place], [place] 03/12/12 2345

## 2012-03-12 NOTE — ED Notes (Signed)
Pt presents to Ed via EMS with complaints of pain in back right leg and hip. Pt denies fall. NAD.

## 2012-03-12 NOTE — ED Notes (Signed)
Patient advises the pain in the right leg started this morning around 06:00am sudden onset he denies a history of pain in this leg, he denies injury.

## 2012-03-12 NOTE — ED Provider Notes (Signed)
History     CSN: [id number]  Arrival date & time 03/12/12  6213   First MD Initiated Contact with Patient 03/12/12 0900      Chief Complaint  Patient presents with  . Leg Pain    (Consider location/radiation/quality/duration/timing/severity/associated sxs/prior treatment) HPI  [name] is a 56 y.o. male complaining of acute onset of low back pain radiating down to the right leg past the knee exam. Pain is severe at 10/10. He has tried no pain medications or to arrival, pain is exacerbated by lying flat. Patient denies any trauma, fever, history of cancer or IV drug use, change in bowel or bladder habits, numbness or paresthesia, prior similar episodes.   Past Medical History  Diagnosis Date  . Seizures   . Diabetes mellitus   . Blindness of right eye     History reviewed. No pertinent past surgical history.  Family History  Problem Relation Age of Onset  . Diabetes Mother   . Hypertension Mother     History  Substance Use Topics  . Smoking status: Current Some Day Smoker    Types: Cigarettes    Last Attempt to Quit: 11/06/2010  . Smokeless tobacco: Never Used  . Alcohol Use: Yes     Comment: occ      Review of Systems  Constitutional: Negative for fever.  Respiratory: Negative for shortness of breath.   Cardiovascular: Negative for chest pain.  Gastrointestinal: Negative for nausea, vomiting, abdominal pain and diarrhea.  Musculoskeletal: Positive for back pain.  All other systems reviewed and are negative.    Allergies  Review of patient's allergies indicates no known allergies.  Home Medications   Current Outpatient Rx  Name  Route  Sig  Dispense  Refill  . ACETAMINOPHEN 325 MG PO TABS   Oral   Take 325 mg by mouth every 6 (six) hours as needed. For pain         . DIVALPROEX SODIUM 500 MG PO TBEC   Oral   Take 1,000-2,500 mg by mouth 2 (two) times daily. Takes 2500 mg in the morning & 1000 mg at night         . LEVETIRACETAM 500 MG PO  TABS   Oral   Take 500 mg by mouth every 12 (twelve) hours.         Marland Kitchen METFORMIN HCL 500 MG PO TABS   Oral   Take 500 mg by mouth daily.          Marland Kitchen PHENYTOIN SODIUM EXTENDED 100 MG PO CAPS   Oral   Take 200 mg by mouth daily.         Marland Kitchen PHENYTOIN 50 MG PO CHEW   Oral   Chew 50 mg by mouth at bedtime.           BP 129/83  Pulse 77  Temp 99.3 F (37.4 C) (Oral)  Resp 16  SpO2 98%  Physical Exam  Nursing note and vitals reviewed. Constitutional: He is oriented to person, place, and time. He appears well-developed and well-nourished. No distress.  HENT:  Head: Normocephalic.  Right Ear: External ear normal.  Left Ear: External ear normal.  Mouth/Throat: Oropharynx is clear and moist.  Eyes: Conjunctivae normal and EOM are normal. Pupils are equal, round, and reactive to light.  Cardiovascular: Normal rate, regular rhythm, normal heart sounds and intact distal pulses.   Pulmonary/Chest: Effort normal and breath sounds normal. No stridor. No respiratory distress. He has no wheezes. He has  no rales. He exhibits no tenderness.  Abdominal: Soft. Bowel sounds are normal. He exhibits no distension and no mass. There is no tenderness. There is no rebound and no guarding.  Musculoskeletal: Normal range of motion. He exhibits no edema and no tenderness.       Right leg shows no edema. Dorsalis pedis is 2+ bilaterally. No tenderness to palpation full range of motion to right hip and knee. Strength is 5 out of 5x4 extremities. Sensation is intact to light touch x4.  Straight leg raise is positive at 10 on the ipsilateral side and 60 on the contralateral side.    Neurological: He is alert and oriented to person, place, and time.       No facial asymmetry or dysarthria. Strength is 4/5x5 extremities. Distal sensation is grossly intact.  Skin:       1 cm partial thickness abrasion to nasal bridge. Mild swelling.  Psychiatric: He has a normal mood and affect.    ED Course    Procedures (including critical care time)  Labs Reviewed - No data to display No results found.   No diagnosis found.    MDM  Patient with no red flags for back pain, presenting with HPI and physical exam consistent with lumbar radiculopathy. Patient will be given morphine IM for symptomatic control.  Patient failed ambulation attempts because pain was too severe. Discussed case with attending Dr. Effie Shy. Plan is to give patient's Decadron IM, Valium by mouth, and moved to CDU on back pain protocol.  Pt signed out to NP Crawford in CDU and put on Back pain protocol. Plan is to d/c Pt after pain is controlled and pt can ambulate.         Wynetta Emery, PA-C 03/12/12 1626

## 2012-03-12 NOTE — ED Notes (Signed)
To xray via strecher.

## 2012-03-12 NOTE — ED Provider Notes (Signed)
Medical screening examination/treatment/procedure(s) were performed by non-physician practitioner and as supervising physician I was immediately available for consultation/collaboration.  Keyan Folson L Torie Towle, MD 03/12/12 2055 

## 2012-03-12 NOTE — ED Provider Notes (Signed)
14:30 Report received from [facility] PA/Dr. Effie Shy on pod A for c/o R L back pain that radiates into his R buttocks.  No cauda equina symptom, weakness or numbness.  No red flags.  Will continue supportive care with pain meds in the CDU unit until patient is more comfortable. X-ray shows DDD. Plan to dc home with pain meds and follow up.   1530  Report given to [facility].  Pain level 6/10 presently.  Unable to ambulate without severe pain at this time.  Remi Haggard, NP 03/13/12 1750

## 2012-03-16 NOTE — ED Provider Notes (Signed)
Medical screening examination/treatment/procedure(s) were performed by non-physician practitioner and as supervising physician I was immediately available for consultation/collaboration.  Flint Melter, MD 03/16/12 2045

## 2012-03-16 NOTE — ED Provider Notes (Signed)
Medical screening examination/treatment/procedure(s) were performed by non-physician practitioner and as supervising physician I was immediately available for consultation/collaboration.  Flint Melter, MD 03/16/12 2046

## 2012-04-21 ENCOUNTER — Encounter (HOSPITAL_COMMUNITY): Payer: Self-pay

## 2012-04-21 ENCOUNTER — Emergency Department (HOSPITAL_COMMUNITY)
Admission: EM | Admit: 2012-04-21 | Discharge: 2012-04-21 | Disposition: A | Discharge status: Home / Self Care | Payer: PRIVATE HEALTH INSURANCE | Attending: Emergency Medicine | Admitting: Emergency Medicine

## 2012-04-21 DIAGNOSIS — I10 Essential (primary) hypertension: Secondary | ICD-10-CM | POA: Insufficient documentation

## 2012-04-21 DIAGNOSIS — E119 Type 2 diabetes mellitus without complications: Secondary | ICD-10-CM | POA: Insufficient documentation

## 2012-04-21 DIAGNOSIS — G40909 Epilepsy, unspecified, not intractable, without status epilepticus: Secondary | ICD-10-CM | POA: Insufficient documentation

## 2012-04-21 DIAGNOSIS — F17200 Nicotine dependence, unspecified, uncomplicated: Secondary | ICD-10-CM | POA: Insufficient documentation

## 2012-04-21 DIAGNOSIS — Z79899 Other long term (current) drug therapy: Secondary | ICD-10-CM | POA: Insufficient documentation

## 2012-04-21 DIAGNOSIS — R51 Headache: Secondary | ICD-10-CM | POA: Insufficient documentation

## 2012-04-21 MED ORDER — PHENYTOIN 50 MG PO CHEW
300.0000 mg | CHEWABLE_TABLET | Freq: Once | ORAL | Status: AC
  Administered 2012-04-21: 300 mg via ORAL
  Filled 2012-04-21: qty 6

## 2012-04-21 MED ORDER — DIAZEPAM 5 MG PO TABS: 5.0000 mg | ORAL_TABLET | Freq: Four times a day (QID) | ORAL | Status: DC | PRN

## 2012-04-21 MED ORDER — PROMETHAZINE HCL 25 MG/ML IJ SOLN
25.0000 mg | Freq: Once | INTRAMUSCULAR | Status: AC
  Administered 2012-04-21: 25 mg via INTRAMUSCULAR
  Filled 2012-04-21: qty 1

## 2012-04-21 NOTE — ED Notes (Signed)
Walked patient. Pt did fine he walked well

## 2012-04-21 NOTE — ED Notes (Signed)
Pt states he has had a headache and has felt pre seizure like symptoms since yesterday.  C/o shob on the way to hospital in ambulance.

## 2012-04-21 NOTE — ED Provider Notes (Signed)
History     CSN: [id number]  Arrival date & time 04/21/12  7829   First MD Initiated Contact with Patient 04/21/12 [phone]      Chief Complaint  Patient presents with  . Headache  . Hypertension    (Consider location/radiation/quality/duration/timing/severity/associated sxs/prior treatment) Patient is a 56 y.o. male presenting with headaches and hypertension. The history is provided by the patient.  Headache  This is a recurrent problem. Pertinent negatives include no shortness of breath, no nausea and no vomiting.  Hypertension Associated symptoms include headaches. Pertinent negatives include no chest pain, no abdominal pain and no shortness of breath.   patient presents with a left-sided throbbing headache. He's had this for a couple days. It is his typical headache. He also states he has had some visual changes in his left eye. He states this is in order he gets before his seizures. He has not had a seizure. No chest pain or difficulty breathing. He states his been a little unsteady. EMS had reportedly been told that he had some shortness of breath. No fevers. No cough. No localizing numbness or weakness. He states his been taking his seizure medications  Past Medical History  Diagnosis Date  . Seizures   . Diabetes mellitus   . Blindness of right eye     History reviewed. No pertinent past surgical history.  Family History  Problem Relation Age of Onset  . Diabetes Mother   . Hypertension Mother     History  Substance Use Topics  . Smoking status: Current Some Day Smoker    Types: Cigarettes    Last Attempt to Quit: 11/06/2010  . Smokeless tobacco: Never Used  . Alcohol Use: Yes     Comment: occ      Review of Systems  Constitutional: Negative for activity change and appetite change.  HENT: Negative for neck stiffness.   Eyes: Negative for pain.  Respiratory: Negative for chest tightness and shortness of breath.   Cardiovascular: Negative for chest pain and  leg swelling.  Gastrointestinal: Negative for nausea, vomiting, abdominal pain and diarrhea.  Genitourinary: Negative for flank pain.  Musculoskeletal: Negative for back pain.  Skin: Negative for rash.  Neurological: Positive for headaches. Negative for weakness and numbness.  Psychiatric/Behavioral: Negative for behavioral problems.    Allergies  Review of patient's allergies indicates no known allergies.  Home Medications   Current Outpatient Rx  Name  Route  Sig  Dispense  Refill  . ACETAMINOPHEN 325 MG PO TABS   Oral   Take 325 mg by mouth every 6 (six) hours as needed. For pain         . DIAZEPAM 5 MG PO TABS   Oral   Take 5 mg by mouth every 8 (eight) hours as needed. For muscle spasms.         Marland Kitchen DIVALPROEX SODIUM 500 MG PO TBEC   Oral   Take 1,000-2,500 mg by mouth 2 (two) times daily. Takes 2500 mg in the morning & 1000 mg at night         . LEVETIRACETAM 500 MG PO TABS   Oral   Take 500 mg by mouth 2 (two) times daily.          Marland Kitchen METFORMIN HCL 500 MG PO TABS   Oral   Take 500 mg by mouth daily.          . OXYCODONE-ACETAMINOPHEN 5-325 MG PO TABS   Oral   Take 1 tablet by mouth  every 4 (four) hours as needed. For pain.         Marland Kitchen PHENYTOIN SODIUM EXTENDED 100 MG PO CAPS   Oral   Take 200 mg by mouth daily. Take along with 50mg  Dilantin.         Marland Kitchen PHENYTOIN 50 MG PO CHEW   Oral   Chew 50 mg by mouth at bedtime. Take along with 200mg  Dilantin.         Marland Kitchen DIAZEPAM 5 MG PO TABS   Oral   Take 1 tablet (5 mg total) by mouth every 6 (six) hours as needed (spasm).   10 tablet   0     BP 106/60  Pulse 61  Temp 99 F (37.2 C) (Oral)  Resp 16  SpO2 99%  Physical Exam  Nursing note and vitals reviewed. Constitutional: He is oriented to person, place, and time. He appears well-developed and well-nourished.  HENT:  Head: Normocephalic and atraumatic.  Eyes:       patietn is blind in right eye.  Neck: Normal range of motion. Neck supple.    Cardiovascular: Normal rate, regular rhythm and normal heart sounds.   No murmur heard. Pulmonary/Chest: Effort normal and breath sounds normal.  Abdominal: Soft. Bowel sounds are normal. He exhibits no distension and no mass. There is no tenderness. There is no rebound and no guarding.  Musculoskeletal: Normal range of motion. He exhibits no edema.  Neurological: He is alert and oriented to person, place, and time. No cranial nerve deficit.  Skin: Skin is warm and dry.  Psychiatric: He has a normal mood and affect.    ED Course  Procedures (including critical care time)  Labs Reviewed  PHENYTOIN LEVEL, TOTAL - Abnormal; Notable for the following:    Phenytoin Lvl 8.1 (*)     All other components within normal limits  VALPROIC ACID LEVEL   No results found.   1. Headache       MDM  Patient with headache. Improved with treatment. Also felt as if he may have a seizure. Dilantin level is mildly low. He's been given an extra oral dose here and will take an extra dose at home. He'll be discharged home. He also was given a refill on his Valium since he is now complaining of muscle spasms in his neck.        Juliet Rude. Rubin Payor, MD 04/21/12 1331

## 2012-04-21 NOTE — ED Notes (Signed)
Pt given happy meal and apple juice.

## 2012-04-21 NOTE — ED Notes (Signed)
Checked patient cbg it was 20 notified RN Jinne of blood sugar

## 2012-04-21 NOTE — Discharge Instructions (Signed)
Headache Headaches are caused by many different problems. Most commonly, headache is caused by muscle tension from an injury, fatigue, or emotional upset. Excessive muscle contractions in the scalp and neck result in a headache that often feels like a tight band around the head. Tension headaches often have areas of tenderness over the scalp and the back of the neck. These headaches may last for hours, days, or longer, and some may contribute to migraines in those who have migraine problems. Migraines usually cause a throbbing headache, which is made worse by activity. Sometimes only one side of the head hurts. Nausea, vomiting, eye pain, and avoidance of food are common with migraines. Visual symptoms such as light sensitivity, blind spots, or flashing lights may also occur. Loud noises may worsen migraine headaches. Many factors may cause migraine headaches:  Emotional stress, lack of sleep, and menstrual periods.   Alcohol and some drugs (such as birth control pills).   Diet factors (fasting, caffeine, food preservatives, chocolate).   Environmental factors (weather changes, bright lights, odors, smoke).  Other causes of headaches include minor injuries to the head. Arthritis in the neck; problems with the jaw, eyes, ears, or nose are also causes of headaches. Allergies, drugs, alcohol, and exposure to smoke can also cause moderate headaches. Rebound headaches can occur if someone uses pain medications for a long period of time and then stops. Less commonly, blood vessel problems in the neck and brain (including stroke) can cause various types of headache. Treatment of headaches includes medicines for pain and relaxation. Ice packs or heat applied to the back of the head and neck help some people. Massaging the shoulders, neck and scalp are often very useful. Relaxation techniques and stretching can help prevent these headaches. Avoid alcohol and cigarette smoking as these tend to make headaches  worse. Please see your caregiver if your headache is not better in 2 days.  SEEK IMMEDIATE MEDICAL CARE IF:   You develop a high fever, chills, or repeated vomiting.   You faint or have difficulty with vision.   You develop unusual numbness or weakness of your arms or legs.   Relief of pain is inadequate with medication, or you develop severe pain.   You develop confusion, or neck stiffness.   You have a worsening of a headache or do not obtain relief.  Document Released: 04/13/2005 Document Revised: 04/02/2011 Document Reviewed: 10/07/2006 [employer] Patient Information 2012 [employer]. 

## 2012-04-22 LAB — GLUCOSE, CAPILLARY: Glucose-Capillary: 86 mg/dL (ref 70–99)

## 2012-06-03 ENCOUNTER — Encounter: Payer: Self-pay | Admitting: Neurology

## 2012-06-03 ENCOUNTER — Ambulatory Visit (INDEPENDENT_AMBULATORY_CARE_PROVIDER_SITE_OTHER): Payer: Medicaid Other | Admitting: Neurology

## 2012-06-03 VITALS — BP 114/70 | HR 80 | Temp 98.1°F | Resp 12 | Ht 66.5 in | Wt 141.0 lb

## 2012-06-03 DIAGNOSIS — G40309 Generalized idiopathic epilepsy and epileptic syndromes, not intractable, without status epilepticus: Secondary | ICD-10-CM

## 2012-06-03 MED ORDER — LEVETIRACETAM 500 MG PO TABS: 500.0000 mg | ORAL_TABLET | Freq: Two times a day (BID) | ORAL | Status: DC

## 2012-06-03 MED ORDER — PHENYTOIN 50 MG PO CHEW: 50.0000 mg | CHEWABLE_TABLET | Freq: Every day | ORAL | Status: DC

## 2012-06-03 MED ORDER — PHENYTOIN SODIUM EXTENDED 100 MG PO CAPS: 100.0000 mg | ORAL_CAPSULE | Freq: Two times a day (BID) | ORAL | Status: DC

## 2012-06-03 MED ORDER — DIVALPROEX SODIUM 500 MG PO DR TAB: DELAYED_RELEASE_TABLET | ORAL | Status: DC

## 2012-06-03 NOTE — Progress Notes (Signed)
[name] has a long history of seizures starting at the age of 2.  His mother told him that he called out of the crib and hit his head and this may be the cause. MRI from 2011 shows mild atrophy, a little more prominent in the cerebellum and in the cerebrum. He has one or 2 seizures a year typically.  He gets an aura of colored lights in the right visual field and most of the time this will progress to a seizure, but it could be several minutes. He usually has time to sit down call the ambulance and lay down. If a seizure does, he passes out and jerks and biceps his tongue most of the time. On a couple of occasions he has gone out and got right back up like nothing had happened.  At one time he took phenobarbital. Then he took Dilantin. The Dilantin level is too high, he does get ataxia. For this reason, his Dilantin was decreased from 300-250 mg. His last level late and 2013 was 8.1 and his Depakote level was 68.7. He takes Depakote 2500 mg in the morning and 1000 mg at night.  He has also been on Keppra and has been on these 3 medications for quite some time and he is pretty well adjusted to them.  In addition, he does drink a regular moderate amount and sometimes uses other drugs.  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  When he was younger, he wanted to go [employer]OT[employer], but he was does qualify due to his seizure condition.  He felt it was time to get to a new neurologist 2 would explain things to him better and work with him.  Review of symptoms is positive for occasional morning headaches that last just a few minutes consisting of pressure. He sleeps well. He gets occasional imbalance, also maximal in the morning. He has had excessive thirst in the past but he is doing well his current diabetes medication. Remainder of review of symptoms is negative.  Past Medical History  Diagnosis Date  . Seizures     since age 57  . Diabetes mellitus   . Blindness of right eye     Current Outpatient  Prescriptions on File Prior to Visit  Medication Sig Dispense Refill  . acetaminophen (TYLENOL) 325 MG tablet Take 325 mg by mouth every 6 (six) hours as needed. For pain      . metFORMIN (GLUCOPHAGE) 500 MG tablet Take 500 mg by mouth daily.       . phenytoin (DILANTIN) 50 MG tablet Chew 1 tablet (50 mg total) by mouth at bedtime. Take along with 200mg  Dilantin.  30 tablet  12  . diazepam (VALIUM) 5 MG tablet Take 5 mg by mouth every 8 (eight) hours as needed. For muscle spasms.      . diazepam (VALIUM) 5 MG tablet Take 1 tablet (5 mg total) by mouth every 6 (six) hours as needed (spasm).  10 tablet  0  . oxyCODONE-acetaminophen (PERCOCET/ROXICET) 5-325 MG per tablet Take 1 tablet by mouth every 4 (four) hours as needed. For pain.       no longer on percocet per patient Review of patient's allergies indicates no known allergies.  History   Social History  . Marital Status: Single    Spouse Name: N/A    Number of Children: N/A  . Years of Education: N/A   Occupational History  . Not on file.   Social History Main Topics  .  Smoking status: Current Some Day Smoker    Types: Cigarettes    Last Attempt to Quit: 11/06/2010  . Smokeless tobacco: Never Used  . Alcohol Use: Yes     Comment: occ--once a week--beer x 2 40 oz  . Drug Use: Yes     Comment: cocaine 3 weeks ago  . Sexually Active: Not on file   Other Topics Concern  . Not on file   Social History Narrative  . No narrative on file    Family History  Problem Relation Age of Onset  . Diabetes Mother   . Hypertension Mother     BP 114/70  Pulse 80  Temp 98.1 F (36.7 C)  Resp 12  Ht 5' 6.5" (1.689 m)  Wt 141 lb (63.957 kg)  BMI 22.42 kg/m2   Alert and oriented x 3.  Memory function appears to be intact.  Concentration and attention are normal for educational level and background.  Speech is fluent and without significant word finding difficulty.  Is aware of current events.  No carotid bruits detected.  Cranial  nerve II through XII are within normal limits excepting that he is blind in the right eye.   optic discs and acuity normal inleft eye, EOMI,  facial movement and sensation intact, hearing grossly intact, gag intact,Uvula raises symmetrically and tongue protrudes evenly. Motor strength is 5 over 5 throughout all limbs.  No atrophy, abnormal tone or tremors. Reflexes are 1+ to trace and symmetric in the upper and lower extremities Sensory exam is intact. Coordination is intact for fine movements and rapid alternating movements in all limbs Gait and station are norma except for mild difficulty with tandeml.   Impression: Long-standing generalized seizure disorder not completely controlled but not showing signs of progressing rapidly in recent years. He is doing pretty well on a 3 drug regimen and he had levels of the Depakote and Dilantin performed in December which are in reasonably good range.  Past MRIs not reveal significant focal abnormalities.  However it is certainly possible that the seizures are related to the childhood head injury.  As noted above he does have an aura consistently preceding events.   He is also having some mild short-lived morning headaches recently but they resolved quickly and they are not overly concerning at this time.  Plan: We have called in 12 months refills for all of his antiseizure medications and he will continue his current regimen I have asked him to reduce her stop alcohol intake as I feel like this factor will tend to make her seizures worse over time, and it's possible that he reduces his seizure control would be better over time. The same applies for other drugs for the most part. If the headaches worsen he will call me. Otherwise return here in 4 months for followup, and recheck blood levels and further adjustments as necessary.

## 2012-09-23 ENCOUNTER — Ambulatory Visit: Payer: PRIVATE HEALTH INSURANCE | Admitting: Neurology

## 2012-10-12 ENCOUNTER — Ambulatory Visit (INDEPENDENT_AMBULATORY_CARE_PROVIDER_SITE_OTHER): Payer: PRIVATE HEALTH INSURANCE | Admitting: Neurology

## 2012-10-12 ENCOUNTER — Encounter: Payer: Self-pay | Admitting: Neurology

## 2012-10-12 VITALS — BP 124/78 | HR 68 | Temp 97.9°F | Resp 12 | Ht 65.0 in | Wt 141.0 lb

## 2012-10-12 DIAGNOSIS — G40309 Generalized idiopathic epilepsy and epileptic syndromes, not intractable, without status epilepticus: Secondary | ICD-10-CM

## 2012-10-12 MED ORDER — DIVALPROEX SODIUM 500 MG PO DR TAB: DELAYED_RELEASE_TABLET | ORAL | Status: DC

## 2012-10-12 MED ORDER — PHENYTOIN 50 MG PO CHEW
50.0000 mg | CHEWABLE_TABLET | Freq: Every day | ORAL | Status: DC
Start: 2012-10-12 — End: 2013-11-03

## 2012-10-12 MED ORDER — PHENYTOIN SODIUM EXTENDED 100 MG PO CAPS: 100.0000 mg | ORAL_CAPSULE | Freq: Two times a day (BID) | ORAL | Status: DC

## 2012-10-12 MED ORDER — LEVETIRACETAM 500 MG PO TABS: 500.0000 mg | ORAL_TABLET | Freq: Two times a day (BID) | ORAL | Status: DC

## 2012-10-12 NOTE — Progress Notes (Signed)
[name] returns for four-month followup.  In the interim, he reports zero seizures and he is continuing to take his medications regularly and he is happy with them.  Also medically-wise he feels like his sugars doing pretty well and he is enjoying life.  He continues on Depakote 2500 in the a.m. +1000 in the p.m., Keppra 500 b.i.d., and Dilantin 250 mg daily.  He is amenable to having a 12 month followup and call us if he has any problems with his medications or seizures in the meantime.  Review of symptoms is positive for occasional ringing in the ears and occasional lightheadedness.  The remainder of the review of systems is negative.  /pmh  Current Outpatient Prescriptions on File Prior to Visit  Medication Sig Dispense Refill  . acetaminophen (TYLENOL) 325 MG tablet Take 325 mg by mouth every 6 (six) hours as needed. For pain      . metFORMIN (GLUCOPHAGE) 500 MG tablet Take 500 mg by mouth daily.       . diazepam (VALIUM) 5 MG tablet Take 5 mg by mouth every 8 (eight) hours as needed. For muscle spasms.      . diazepam (VALIUM) 5 MG tablet Take 1 tablet (5 mg total) by mouth every 6 (six) hours as needed (spasm).  10 tablet  0  . oxyCODONE-acetaminophen (PERCOCET/ROXICET) 5-325 MG per tablet Take 1 tablet by mouth every 4 (four) hours as needed. For pain.       No current facility-administered medications on file prior to visit.   Review of patient's allergies indicates no known allergies.  History   Social History  . Marital Status: Single    Spouse Name: N/A    Number of Children: N/A  . Years of Education: N/A   Occupational History  . Not on file.   Social History Main Topics  . Smoking status: Current Some Day Smoker    Types: Cigarettes    Last Attempt to Quit: 11/06/2010  . Smokeless tobacco: Never Used  . Alcohol Use: Yes     Comment: occ--three times a week--beer x 2 40 oz  . Drug Use: Yes     Comment: cocaine 3 weeks ago  . Sexually Active: Not on file   Other Topics  Concern  . Not on file   Social History Narrative  . No narrative on file    Family History  Problem Relation Age of Onset  . Diabetes Mother   . Hypertension Mother     BP 124/78  Pulse 68  Temp(Src) 97.9 F (36.6 C)  Resp 12  Ht 5\' 5"  (1.651 m)  Wt 141 lb (63.957 kg)  BMI 23.46 kg/m2  Alert and oriented x 3.  Memory function appears to be intact.  Concentration and attention are normal for educational level and background.  Speech is fluent and without significant word finding difficulty.  Is aware of current events.  No carotid bruits detected.  Cranial nerve II through XII reveals blindness in the right eye with preserved vision in the left eye, EOMI, PERLA, facial movement and sensation intact, hearing grossly intact, gag intact,Uvula raises symmetrically and tongue protrudes evenly. Motor strength is 5 over 5 throughout all limbs.  No atrophy, abnormal tone or tremors. Reflexes are 2+ and symmetric in the upper and lower extremities Sensory exam is intact. Coordination is intact for fine movements and rapid alternating movements in all limbs Gait and station are normal.   Impression: Partial onset seizures well controlled on a  3 drug regimen.  It is possible he could be controlled to go regimen, but he is doing very well and he is reluctant to change anything since his working.  Plan: Medications are renewed for 12 months including the Keppra, Dilantin at both strengths and Depakote. Return in 12 months or as needed.

## 2012-10-12 NOTE — Patient Instructions (Addendum)
Follow up in one year. Call us if you needs before then.

## 2012-10-27 ENCOUNTER — Telehealth: Payer: Self-pay | Admitting: Neurology

## 2012-10-27 NOTE — Telephone Encounter (Signed)
Patient was followed by Dr. Smiley Houseman.  Takes Depakote DR 2500mg  qAM and 1000mg  qHS.  DR supply is backordered until October.  Spoke with pharmacy ([facility]) and will change to Depakote ER 4000mg  qAM.  No need to check level.

## 2012-10-27 NOTE — Telephone Encounter (Signed)
Called and left the patient a message on his mobile number re: Depakote refill.

## 2013-02-01 ENCOUNTER — Encounter: Payer: Self-pay | Admitting: Neurology

## 2013-02-01 ENCOUNTER — Ambulatory Visit (INDEPENDENT_AMBULATORY_CARE_PROVIDER_SITE_OTHER): Payer: PRIVATE HEALTH INSURANCE | Admitting: Neurology

## 2013-02-01 VITALS — BP 96/66 | HR 68 | Temp 98.3°F | Ht 65.0 in | Wt 143.0 lb

## 2013-02-01 DIAGNOSIS — G40209 Localization-related (focal) (partial) symptomatic epilepsy and epileptic syndromes with complex partial seizures, not intractable, without status epilepticus: Secondary | ICD-10-CM

## 2013-02-01 DIAGNOSIS — R569 Unspecified convulsions: Secondary | ICD-10-CM

## 2013-02-01 LAB — CBC WITH DIFFERENTIAL/PLATELET
Eosinophils Relative: 0.6 % (ref 0.0–5.0)
HCT: 42.3 % (ref 39.0–52.0)
Lymphs Abs: 3.6 10*3/uL (ref 0.7–4.0)
Monocytes Relative: 8.8 % (ref 3.0–12.0)
Platelets: 196 10*3/uL (ref 150.0–400.0)
WBC: 7.2 10*3/uL (ref 4.5–10.5)

## 2013-02-01 LAB — COMPREHENSIVE METABOLIC PANEL
Albumin: 4 g/dL (ref 3.5–5.2)
CO2: 30 mEq/L (ref 19–32)
Calcium: 9.1 mg/dL (ref 8.4–10.5)
Chloride: 108 mEq/L (ref 96–112)
GFR: 103.69 mL/min (ref >60.00)
Glucose, Bld: 73 mg/dL (ref 70–99)
Potassium: 4.2 mEq/L (ref 3.5–5.1)
Sodium: 148 mEq/L — ABNORMAL HIGH (ref 135–145)
Total Bilirubin: 0.6 mg/dL (ref 0.3–1.2)
Total Protein: 7.3 g/dL (ref 6.0–8.3)

## 2013-02-01 MED ORDER — DIVALPROEX SODIUM ER 500 MG PO TB24: 4000.0000 mg | ORAL_TABLET | Freq: Every day | ORAL | Status: DC

## 2013-02-01 NOTE — Patient Instructions (Addendum)
1.  Continue Dilantin 250mg  daily, Keppra 500mg  twice daily and Depakote ER 8 tablets daily. 2.  Because long-term use of Dilantin may cause osteopenia, I recommend taking calcium 1000 mg daily and vitamin D 2000 IU daily.  This is often found in a combination pill.  You can get it at any pharmacy. 3.  Check blood work 4.  Follow up in 6 months.

## 2013-02-01 NOTE — Progress Notes (Signed)
NEUROLOGY FOLLOW UP OFFICE NOTE  [name] [id number]  HISTORY OF PRESENT ILLNESS: [name] is a 57 year old right-handed man with diabetes mellitus and right eye blindness who presents for follow-up regarding epilepsy.  He was previously followed by Dr. Murriel Hopper, who has since left [facility].  Records and images were personally reviewed where available.    He began having seizures at age 68.  It may have been precipitated by a head injury after falling out of his crib.    Semiology:  Aura of colored lights in right visual field.  It may last several minutes.  Usually the warning of the aura gives him time to lay down and call the ambulance.  Sometimes it resolves, while other times it progresses to a full-blown seizure, described as loss of consciousness, generalized jerking and tongue biting.  He averages approximately 1-2 seizures per year.  But he says last seizure was around 1 to 1.5 years ago.  Current medication:  Depakote ER 4000mg  daily, Keppra 500mg  BID, Dilantin 250mg  qd. Prior medication:  phenobarbital.  Higher doses of Dilantin caused ataxia.  09/20/09 MRI Brain w/o:  mild generalized atrophy with prominence to the cerebellum.  Post-operative changes of right globe.  He was taking Depakote DR.  This past summer, Depakote DR was backordered, so changed to Depakote ER 4000mg  qAM.  He is fine with remaining on this.  12/26-27/13:  phenytoin level 8.1, valproic acid level 69.8 01/25/12:  WBC 6.5, Hgb 13.4, HCT 39.7, PLT 177, Na 141, K 4.8, Cl 104, glucose 86, BUN 15, Cr 0.84, Ca 9.2, AP 51, AST 29, ALT 16, albumin 3.  PAST MEDICAL HISTORY: Past Medical History  Diagnosis Date  . Seizures     since age 21  . Diabetes mellitus   . Blindness of right eye     MEDICATIONS: Current Outpatient Prescriptions on File Prior to Visit  Medication Sig Dispense Refill  . acetaminophen (TYLENOL) 325 MG tablet Take 325 mg by mouth every 6 (six) hours as needed. For pain       . levETIRAcetam (KEPPRA) 500 MG tablet Take 1 tablet (500 mg total) by mouth 2 (two) times daily.  60 tablet  12  . metFORMIN (GLUCOPHAGE) 500 MG tablet Take 500 mg by mouth daily.       . phenytoin (DILANTIN) 100 MG ER capsule Take 1 capsule (100 mg total) by mouth 2 (two) times daily. Take along with 50mg  Dilantin.  60 capsule  12  . phenytoin (DILANTIN) 50 MG tablet Chew 1 tablet (50 mg total) by mouth at bedtime. Take along with 200mg  Dilantin.  30 tablet  12  . diazepam (VALIUM) 5 MG tablet Take 5 mg by mouth every 8 (eight) hours as needed. For muscle spasms.      . diazepam (VALIUM) 5 MG tablet Take 1 tablet (5 mg total) by mouth every 6 (six) hours as needed (spasm).  10 tablet  0  . oxyCODONE-acetaminophen (PERCOCET/ROXICET) 5-325 MG per tablet Take 1 tablet by mouth every 4 (four) hours as needed. For pain.       No current facility-administered medications on file prior to visit.    ALLERGIES: No Known Allergies  FAMILY HISTORY: Family History  Problem Relation Age of Onset  . Diabetes Mother   . Hypertension Mother     SOCIAL HISTORY: History   Social History  . Marital Status: Single    Spouse Name: N/A    Number of Children: N/A  .  Years of Education: N/A   Occupational History  . Not on file.   Social History Main Topics  . Smoking status: Current Some Day Smoker    Types: Cigarettes    Last Attempt to Quit: 11/06/2010  . Smokeless tobacco: Never Used  . Alcohol Use: Yes     Comment: occ--four times a week--beer x 2 40 oz  . Drug Use: Yes     Comment: cocaine--6 months ago   . Sexual Activity: Not on file   Other Topics Concern  . Not on file   Social History Narrative  . No narrative on file    PHYSICAL EXAM: Filed Vitals:   02/01/13 1006  BP: 96/66  Pulse: 68  Temp: 98.3 F (36.8 C)   General: No acute distress Head:  Normocephalic/atraumatic Neck: supple, no paraspinal tenderness, full range of motion Back: No paraspinal  tenderness Neurological Exam: alert and oriented to person, place, and time, speech fluent and not dysarthric, language intact.  Right eye blindness, otherwise CN II-XII intact, bulk and tone normal, muscle strength 5/5 throughout, sensation to light touch, temperature and vibration intact, deep tendon reflexes 2+ throughout, toes downgoing, finger to nose intact, gait normal, Romberg negative.  IMPRESSION/PLAN: Simple partial seizures with secondary generalization. 1.  Continue Depakote, Keppra, Dilantin 2.  Check CBC, CMP, phenytoin level, valproic acid level, vitamin D level 3.  Recommend starting calcium 1000mg  and vitamin D 2000 IU daily  4.  Follow up in 6 months.  45 minutes spent with patient, over 50% spent counseling and coordinating care.  Shon Millet, DO  CC:  Quitman Livings, MD

## 2013-02-02 LAB — PHENYTOIN LEVEL, TOTAL: Phenytoin Lvl: 8.4 ug/mL — ABNORMAL LOW (ref 10.0–20.0)

## 2013-03-27 ENCOUNTER — Emergency Department (HOSPITAL_COMMUNITY)
Admission: EM | Admit: 2013-03-27 | Discharge: 2013-03-27 | Disposition: A | Discharge status: Home / Self Care | Payer: PRIVATE HEALTH INSURANCE | Attending: Emergency Medicine | Admitting: Emergency Medicine

## 2013-03-27 ENCOUNTER — Encounter (HOSPITAL_COMMUNITY): Payer: Self-pay | Admitting: Emergency Medicine

## 2013-03-27 DIAGNOSIS — G40909 Epilepsy, unspecified, not intractable, without status epilepticus: Secondary | ICD-10-CM | POA: Insufficient documentation

## 2013-03-27 DIAGNOSIS — M549 Dorsalgia, unspecified: Secondary | ICD-10-CM | POA: Insufficient documentation

## 2013-03-27 DIAGNOSIS — F17200 Nicotine dependence, unspecified, uncomplicated: Secondary | ICD-10-CM | POA: Insufficient documentation

## 2013-03-27 DIAGNOSIS — E119 Type 2 diabetes mellitus without complications: Secondary | ICD-10-CM | POA: Insufficient documentation

## 2013-03-27 DIAGNOSIS — Z79899 Other long term (current) drug therapy: Secondary | ICD-10-CM | POA: Insufficient documentation

## 2013-03-27 MED ORDER — DIAZEPAM 5 MG PO TABS: 5.0000 mg | ORAL_TABLET | Freq: Two times a day (BID) | ORAL | Status: DC

## 2013-03-27 MED ORDER — DEXAMETHASONE SODIUM PHOSPHATE 10 MG/ML IJ SOLN
10.0000 mg | Freq: Once | INTRAMUSCULAR | Status: AC
  Administered 2013-03-27: 10 mg via INTRAMUSCULAR
  Filled 2013-03-27: qty 1

## 2013-03-27 MED ORDER — DIAZEPAM 5 MG PO TABS
5.0000 mg | ORAL_TABLET | Freq: Once | ORAL | Status: AC
  Administered 2013-03-27: 5 mg via ORAL
  Filled 2013-03-27: qty 1

## 2013-03-27 MED ORDER — OXYCODONE-ACETAMINOPHEN 5-325 MG PO TABS: 2.0000 | ORAL_TABLET | ORAL | Status: DC | PRN

## 2013-03-27 MED ORDER — KETOROLAC TROMETHAMINE 60 MG/2ML IM SOLN
60.0000 mg | Freq: Once | INTRAMUSCULAR | Status: AC
  Administered 2013-03-27: 60 mg via INTRAMUSCULAR
  Filled 2013-03-27: qty 2

## 2013-03-27 NOTE — ED Notes (Signed)
Rt hip pain no injury x 3-4 weeks getting worse

## 2013-03-27 NOTE — ED Provider Notes (Signed)
CSN: [id number]     Arrival date & time 03/27/13  4098 History  This chart was scribed for Dustin Elders, NP, working with Leonette Most B. Bernette Mayers, MD, by [name] ED Scribe. This patient was seen in room TR08C/TR08C and the patient's care was started at 10:16 AM.   Chief Complaint  Patient presents with  . Hip Pain    The history is provided by the patient. No language interpreter was used.    HPI Comments: [name] is a 57 y.o. male who presents to the Emergency Department complaining of intermittent, moderate right hip pain onset 3-4 weeks ago, which worsened today. He states that he has a history of flare-ups of similar pain, most recently being about 6-7 months ago. He states that he received a muscle relaxant, Percocet, and "two shots" in the ED last time he had a flare up, and he states that these medications offered him relief. He denies any recent injuries to have onset his pain. He denies any other pain or symptoms.  PCP- Dr. Quitman Livings   Past Medical History  Diagnosis Date  . Seizures     since age 62  . Diabetes mellitus   . Blindness of right eye    History reviewed. No pertinent past surgical history. Family History  Problem Relation Age of Onset  . Diabetes Mother   . Hypertension Mother    History  Substance Use Topics  . Smoking status: Current Some Day Smoker    Types: Cigarettes    Last Attempt to Quit: 11/06/2010  . Smokeless tobacco: Never Used  . Alcohol Use: Yes     Comment: occ--four times a week--beer x 2 40 oz    Review of Systems  Musculoskeletal: Positive for arthralgias (right hip).  All other systems reviewed and are negative.   Allergies  Review of patient's allergies indicates no known allergies.  Home Medications   Current Outpatient Rx  Name  Route  Sig  Dispense  Refill  . divalproex (DEPAKOTE ER) 500 MG 24 hr tablet   Oral   Take 8 tablets (4,000 mg total) by mouth daily.   240 tablet   11   . levETIRAcetam (KEPPRA) 500  MG tablet   Oral   Take 1 tablet (500 mg total) by mouth 2 (two) times daily.   60 tablet   12   . metFORMIN (GLUCOPHAGE) 500 MG tablet   Oral   Take 500 mg by mouth 2 (two) times daily with a meal.          . phenytoin (DILANTIN) 100 MG ER capsule   Oral   Take 1 capsule (100 mg total) by mouth 2 (two) times daily. Take along with 50mg  Dilantin.   60 capsule   12   . phenytoin (DILANTIN) 50 MG tablet   Oral   Chew 1 tablet (50 mg total) by mouth at bedtime. Take along with 200mg  Dilantin.   30 tablet   12   . acetaminophen (TYLENOL) 325 MG tablet   Oral   Take 325 mg by mouth every 6 (six) hours as needed. For pain         . diazepam (VALIUM) 5 MG tablet   Oral   Take 1 tablet (5 mg total) by mouth 2 (two) times daily.   10 tablet   0   . oxyCODONE-acetaminophen (PERCOCET/ROXICET) 5-325 MG per tablet   Oral   Take 2 tablets by mouth every 4 (four) hours as needed.  15 tablet   0    Triage Vitals: BP 107/79  Pulse 87  Temp(Src) 98.8 F (37.1 C)  Resp 16  SpO2 98%  Physical Exam  Nursing note and vitals reviewed. Constitutional: He is oriented to person, place, and time. He appears well-developed and well-nourished. No distress.  HENT:  Head: Normocephalic and atraumatic.  Eyes: EOM are normal.  Neck: Neck supple. No tracheal deviation present.  Cardiovascular: Normal rate.   Pulmonary/Chest: Effort normal. No respiratory distress.  Musculoskeletal: Normal range of motion.  Neurological: He is alert and oriented to person, place, and time.  Skin: Skin is warm and dry.  Psychiatric: He has a normal mood and affect. His behavior is normal.    ED Course  Procedures (including critical care time)  DIAGNOSTIC STUDIES: Oxygen Saturation is 98% on RA, normal by my interpretation.    COORDINATION OF CARE: 10:21 AM- Discussed plan to order Toradol, Valium and Decadron. Pt advised of plan for treatment and pt agrees.  Medications  ketorolac (TORADOL)  injection 60 mg (60 mg Intramuscular Given 03/27/13 1038)  diazepam (VALIUM) tablet 5 mg (5 mg Oral Given 03/27/13 1037)  dexamethasone (DECADRON) injection 10 mg (10 mg Intramuscular Given 03/27/13 1109)   Labs Review Labs Reviewed - No data to display Imaging Review No results found.  EKG Interpretation   None       MDM   1. Back pain     Pt describes this as a typical flare-up of his back pain. He reports relief after decadron, toradol and valium given here. Sent home with prescriptions for percocet and valium. No focal deficits or weakness. No numbness, tingling or incontinence of bowel or bladder. Ambulatory without any gait deficits.  I personally performed the services described in this documentation, which was scribed in my presence. The recorded information has been reviewed and is accurate.    Dustin Elders, NP 03/28/13 1101

## 2013-03-28 NOTE — ED Provider Notes (Signed)
Medical screening examination/treatment/procedure(s) were performed by non-physician practitioner and as supervising physician I was immediately available for consultation/collaboration.  EKG Interpretation   None         Blessin Kanno B. Alvena Kiernan, MD 03/28/13 1345 

## 2013-04-05 ENCOUNTER — Encounter (HOSPITAL_COMMUNITY): Payer: Self-pay | Admitting: Emergency Medicine

## 2013-04-05 ENCOUNTER — Emergency Department (HOSPITAL_COMMUNITY)
Admission: EM | Admit: 2013-04-05 | Discharge: 2013-04-05 | Disposition: A | Discharge status: Home / Self Care | Payer: PRIVATE HEALTH INSURANCE | Attending: Emergency Medicine | Admitting: Emergency Medicine

## 2013-04-05 DIAGNOSIS — Z79899 Other long term (current) drug therapy: Secondary | ICD-10-CM | POA: Insufficient documentation

## 2013-04-05 DIAGNOSIS — G40909 Epilepsy, unspecified, not intractable, without status epilepticus: Secondary | ICD-10-CM | POA: Insufficient documentation

## 2013-04-05 DIAGNOSIS — E119 Type 2 diabetes mellitus without complications: Secondary | ICD-10-CM | POA: Insufficient documentation

## 2013-04-05 DIAGNOSIS — M25552 Pain in left hip: Secondary | ICD-10-CM

## 2013-04-05 DIAGNOSIS — H5440 Blindness, one eye, unspecified eye: Secondary | ICD-10-CM | POA: Insufficient documentation

## 2013-04-05 DIAGNOSIS — F17200 Nicotine dependence, unspecified, uncomplicated: Secondary | ICD-10-CM | POA: Insufficient documentation

## 2013-04-05 DIAGNOSIS — M25559 Pain in unspecified hip: Secondary | ICD-10-CM | POA: Insufficient documentation

## 2013-04-05 DIAGNOSIS — G8929 Other chronic pain: Secondary | ICD-10-CM | POA: Insufficient documentation

## 2013-04-05 HISTORY — DX: Epilepsy, unspecified, not intractable, without status epilepticus: G40.909

## 2013-04-05 MED ORDER — DEXAMETHASONE SODIUM PHOSPHATE 10 MG/ML IJ SOLN
10.0000 mg | Freq: Once | INTRAMUSCULAR | Status: AC
  Administered 2013-04-05: 10 mg via INTRAMUSCULAR
  Filled 2013-04-05: qty 1

## 2013-04-05 MED ORDER — DIAZEPAM 5 MG PO TABS
10.0000 mg | ORAL_TABLET | Freq: Once | ORAL | Status: AC
  Administered 2013-04-05: 10 mg via ORAL
  Filled 2013-04-05: qty 2

## 2013-04-05 MED ORDER — OXYCODONE-ACETAMINOPHEN 5-325 MG PO TABS: 1.0000 | ORAL_TABLET | Freq: Four times a day (QID) | ORAL | Status: DC | PRN

## 2013-04-05 MED ORDER — KETOROLAC TROMETHAMINE 60 MG/2ML IM SOLN
60.0000 mg | Freq: Once | INTRAMUSCULAR | Status: AC
  Administered 2013-04-05: 60 mg via INTRAMUSCULAR
  Filled 2013-04-05: qty 2

## 2013-04-05 NOTE — ED Notes (Signed)
Pt here for left hip and leg pain on 03/27/2013. Returns today for same. States the Percocet "helped a lot". Pt works as Software engineer for [employer].

## 2013-04-05 NOTE — ED Provider Notes (Signed)
CSN: [id number]     Arrival date & time 04/05/13  4098 History   First MD Initiated Contact with Patient 04/05/13 1016    This chart was scribed for Junious Silk PA-C, a non-physician practitioner working with Toy Baker, MD by Lewanda Rife, ED Scribe. This patient was seen in room TR09C/TR09C and the patient's care was started at 10:20 AM     No chief complaint on file.  (Consider location/radiation/quality/duration/timing/severity/associated sxs/prior Treatment) The history is provided by the patient. No language interpreter was used.   HPI Comments: [name] is a 57 y.o. male who presents to the Emergency Department complaining of recurring moderate left hip pain radiating to left mid upper thigh onset chronic and was evaluated in ED for the same on 03/27/13. Describes pain as constant and aching. Reports pain is exacerbated by walking and weight bearing. Reports pain is alleviated by percocet, but ran out. Denies associated recent, injury, fall, fever, chills, numbness, urinary or fecal incontinence, urinary retention, dysuria, and PMHx of cancer.   Past Medical History  Diagnosis Date  . Seizures     since age 31  . Diabetes mellitus   . Blindness of right eye    No past surgical history on file. Family History  Problem Relation Age of Onset  . Diabetes Mother   . Hypertension Mother    History  Substance Use Topics  . Smoking status: Current Some Day Smoker    Types: Cigarettes    Last Attempt to Quit: 11/06/2010  . Smokeless tobacco: Never Used  . Alcohol Use: Yes     Comment: occ--four times a week--beer x 2 40 oz    Review of Systems  Constitutional: Negative for fever.  Musculoskeletal: Positive for myalgias.  All other systems reviewed and are negative.   A complete 10 system review of systems was obtained and all systems are negative except as noted in the HPI and PMHx.   Allergies  Review of patient's allergies indicates no known  allergies.  Home Medications   Current Outpatient Rx  Name  Route  Sig  Dispense  Refill  . acetaminophen (TYLENOL) 325 MG tablet   Oral   Take 325 mg by mouth every 6 (six) hours as needed. For pain         . diazepam (VALIUM) 5 MG tablet   Oral   Take 1 tablet (5 mg total) by mouth 2 (two) times daily.   10 tablet   0   . divalproex (DEPAKOTE ER) 500 MG 24 hr tablet   Oral   Take 8 tablets (4,000 mg total) by mouth daily.   240 tablet   11   . levETIRAcetam (KEPPRA) 500 MG tablet   Oral   Take 1 tablet (500 mg total) by mouth 2 (two) times daily.   60 tablet   12   . metFORMIN (GLUCOPHAGE) 500 MG tablet   Oral   Take 500 mg by mouth 2 (two) times daily with a meal.          . oxyCODONE-acetaminophen (PERCOCET/ROXICET) 5-325 MG per tablet   Oral   Take 2 tablets by mouth every 4 (four) hours as needed.   15 tablet   0   . phenytoin (DILANTIN) 100 MG ER capsule   Oral   Take 1 capsule (100 mg total) by mouth 2 (two) times daily. Take along with 50mg  Dilantin.   60 capsule   12   . phenytoin (DILANTIN) 50 MG  tablet   Oral   Chew 1 tablet (50 mg total) by mouth at bedtime. Take along with 200mg  Dilantin.   30 tablet   12    BP 135/89  Pulse 81  Temp(Src) 98.2 F (36.8 C) (Oral)  SpO2 100% Physical Exam  Nursing note and vitals reviewed. Constitutional: He is oriented to person, place, and time. He appears well-developed and well-nourished. No distress.  HENT:  Head: Normocephalic and atraumatic.  Eyes: EOM are normal.  Neck: Neck supple. No tracheal deviation present.  Cardiovascular: Normal rate and intact distal pulses.   Pulses:      Dorsalis pedis pulses are 2+ on the left side.       Posterior tibial pulses are 2+ on the left side.  Neurovascularly intact   Pulmonary/Chest: Effort normal. No respiratory distress.  Musculoskeletal: Normal range of motion.  TTP paraspinal left low back, lateral left hip, and lateral aspect of left proximal  upper leg Full ROM of left hip   Positive log roll test of left hip   No midline C-spine, T-spine, or L-spine tenderness with no step-offs or deformities noted     Neurological: He is alert and oriented to person, place, and time. He has normal strength. No sensory deficit.  LLE Strength 5/5   Skin: Skin is warm and dry.  Psychiatric: He has a normal mood and affect. His behavior is normal.    ED Course  Procedures  COORDINATION OF CARE:  Nursing notes reviewed. Vital signs reviewed. Initial pt interview and examination performed.  Treatment plan initiated: Medications  ketorolac (TORADOL) injection 60 mg (not administered)  diazepam (VALIUM) tablet 10 mg (not administered)  dexamethasone (DECADRON) injection 10 mg (not administered)     Initial diagnostic testing ordered.    Labs Review Labs Reviewed - No data to display Imaging Review No results found.  EKG Interpretation   None       MDM   1. Left hip pain    Patient presents with an exacerbation of chronic pain. He was evaluated for same pain on 12/1. Now out of percocet which helped the pain. He has a pending appointment scheduled. I referenced the drug database which showed his only narcotic prescription was on 12/1. Discussed with patient the emergency department is not the appropriate place to receive narcotic refills, but I gave him a small amount of percocet until he goes to his scheduled appointment. No new injury. No indication for additional imaging at this time. Return instructions given. Vital signs stable for discharge. Patient / Family / Caregiver informed of clinical course, understand medical decision-making process, and agree with plan.   I personally performed the services described in this documentation, which was scribed in my presence. The recorded information has been reviewed and is accurate.    Mora Bellman, PA-C 04/05/13 [phone]

## 2013-04-05 NOTE — ED Provider Notes (Signed)
Medical screening examination/treatment/procedure(s) were performed by non-physician practitioner and as supervising physician I was immediately available for consultation/collaboration.  Toy Baker, MD 04/05/13 [phone]

## 2013-04-14 ENCOUNTER — Other Ambulatory Visit (HOSPITAL_COMMUNITY): Payer: Self-pay | Admitting: Orthopedic Surgery

## 2013-04-14 ENCOUNTER — Ambulatory Visit (HOSPITAL_COMMUNITY)
Admission: RE | Admit: 2013-04-14 | Discharge: 2013-04-14 | Disposition: A | Discharge status: Home / Self Care | Payer: PRIVATE HEALTH INSURANCE | Source: Ambulatory Visit | Attending: Orthopedic Surgery | Admitting: Orthopedic Surgery

## 2013-04-14 DIAGNOSIS — M79609 Pain in unspecified limb: Secondary | ICD-10-CM

## 2013-04-14 NOTE — Progress Notes (Signed)
VASCULAR LAB PRELIMINARY  PRELIMINARY  PRELIMINARY  PRELIMINARY  Left lower extremity venous duplex completed.    Preliminary report:  Left:  No evidence of DVT, superficial thrombosis, or Baker's cyst.  Rayaan Lorah, RVT 04/14/2013, 1:17 PM

## 2013-06-28 ENCOUNTER — Other Ambulatory Visit: Payer: Self-pay | Admitting: Family Medicine

## 2013-07-31 ENCOUNTER — Emergency Department (HOSPITAL_COMMUNITY)
Admission: EM | Admit: 2013-07-31 | Discharge: 2013-07-31 | Disposition: A | Discharge status: Home / Self Care | Payer: PRIVATE HEALTH INSURANCE | Attending: Emergency Medicine | Admitting: Emergency Medicine

## 2013-07-31 ENCOUNTER — Encounter (HOSPITAL_COMMUNITY): Payer: Self-pay | Admitting: Emergency Medicine

## 2013-07-31 DIAGNOSIS — G40909 Epilepsy, unspecified, not intractable, without status epilepticus: Secondary | ICD-10-CM | POA: Insufficient documentation

## 2013-07-31 DIAGNOSIS — E119 Type 2 diabetes mellitus without complications: Secondary | ICD-10-CM | POA: Insufficient documentation

## 2013-07-31 DIAGNOSIS — Z79899 Other long term (current) drug therapy: Secondary | ICD-10-CM | POA: Insufficient documentation

## 2013-07-31 DIAGNOSIS — F17200 Nicotine dependence, unspecified, uncomplicated: Secondary | ICD-10-CM | POA: Insufficient documentation

## 2013-07-31 DIAGNOSIS — H5440 Blindness, one eye, unspecified eye: Secondary | ICD-10-CM | POA: Insufficient documentation

## 2013-07-31 LAB — BASIC METABOLIC PANEL
BUN: 25 mg/dL — ABNORMAL HIGH (ref 6–23)
CALCIUM: 9 mg/dL (ref 8.4–10.5)
CO2: 28 meq/L (ref 19–32)
Chloride: 105 mEq/L (ref 96–112)
Creatinine, Ser: 0.9 mg/dL (ref 0.50–1.35)
Glucose, Bld: 75 mg/dL (ref 70–99)
Potassium: 4 mEq/L (ref 3.7–5.3)
SODIUM: 146 meq/L (ref 137–147)

## 2013-07-31 LAB — VALPROIC ACID LEVEL

## 2013-07-31 MED ORDER — LORAZEPAM 1 MG PO TABS
1.0000 mg | ORAL_TABLET | Freq: Once | ORAL | Status: AC
  Administered 2013-07-31: 1 mg via ORAL
  Filled 2013-07-31: qty 1

## 2013-07-31 MED ORDER — DIVALPROEX SODIUM 500 MG PO DR TAB
3000.0000 mg | DELAYED_RELEASE_TABLET | Freq: Once | ORAL | Status: AC
Start: 2013-07-31 — End: 2013-07-31
  Administered 2013-07-31: 3000 mg via ORAL
  Filled 2013-07-31: qty 6

## 2013-07-31 MED ORDER — DIVALPROEX SODIUM 500 MG PO DR TAB: 3500.0000 mg | DELAYED_RELEASE_TABLET | Freq: Every day | ORAL | Status: DC

## 2013-07-31 MED ORDER — DIVALPROEX SODIUM 500 MG PO DR TAB: 500.0000 mg | DELAYED_RELEASE_TABLET | Freq: Every day | ORAL | Status: DC

## 2013-07-31 MED ORDER — DIVALPROEX SODIUM 250 MG PO DR TAB
500.0000 mg | DELAYED_RELEASE_TABLET | Freq: Once | ORAL | Status: AC
  Administered 2013-07-31: 500 mg via ORAL
  Filled 2013-07-31: qty 2

## 2013-07-31 NOTE — ED Notes (Signed)
Pt reporting his discharge rx is not enough Depakote. MD made aware of patient concerns.

## 2013-07-31 NOTE — Discharge Instructions (Signed)
Epilepsy People with epilepsy have times when they shake and jerk uncontrollably (seizures). This happens when there is a sudden change in brain function. Epilepsy may have many possible causes. Anything that disturbs the normal pattern of brain cell activity can lead to seizures. HOME CARE   Follow your doctor's instructions about driving and safety during normal activities.  Get enough sleep.  Only take medicine as told by your doctor.  Avoid things that you know can cause you to have seizures (triggers).  Write down when your seizures happen and what you remember about each seizure. Write down anything you think may have caused the seizure to happen.  Tell the people you live and work with that you have seizures. Make sure they know how to help you. They should:  Cushion your head and body.  Turn you on your side.  Not restrain you.  Not place anything inside your mouth.  Call for local emergency medical help if there is any question about what has happened.  Keep all follow-up visits with your doctor. This is very important. GET HELP IF:  You get an infection or start to feel sick. You may have more seizures when you are sick.  You are having seizures more often.  Your seizure pattern is changing. GET HELP RIGHT AWAY IF:   A seizure does not stop after a few seconds or minutes.  A seizure causes you to have trouble breathing.  A seizure gives you a very bad headache.  A seizure makes you unable to speak or use a part of your body. Document Released: 02/08/2009 Document Revised: 02/01/2013 Document Reviewed: 11/23/2012 [employer] Patient Information 2014 [employer].  

## 2013-07-31 NOTE — ED Provider Notes (Signed)
Medical screening examination/treatment/procedure(s) were conducted as a shared visit with non-physician practitioner(s) or resident and myself. I personally evaluated the patient during the encounter and agree with the findings and plan unless otherwise indicated.  I have personally reviewed any xrays and/ or EKG's with the provider and I agree with interpretation.  Patient with history of seizures and out of his Depakote for the past 3 days. Holiday weekend made it difficult for him to refill his prescription. He normally feels an aura prior to the seizure, the last seizure was approximately 6 months ago. Patient did start to feel mild aura earlier today. Patient denies fevers or significant recent illness. Mild nonproductive cough the past couple weeks. The patient has blindness in the right eye, chronic. No ha.  Patient well-appearing, except for blindness in right eye cranial nerves intact. Neck supple no meningismus. No facial droop or arm drift. Her seizure activity and ED. Plan for electrolytes, oral fluids and by mouth Depakote with prescription for home. He understands he is not able to drive and reasons to return.  Seizure disorder, Medicine refill   Mariea Clonts, MD 07/31/13 [phone]

## 2013-07-31 NOTE — ED Notes (Signed)
Per EMS- Pt has seizure disorder, has been out of depakote for 3 days. Unable to get filled because of the holiday weekend.  Today felt and aura coming on. Was seeing 2/3 colors he normally sees before a seizure occurs. Pt did not actually have a seizure. Pt is a x 4. In NAD BP 134/94, HR 80, CBG 75.

## 2013-07-31 NOTE — ED Notes (Signed)
In to discharge patient, he reports he is not ready to go and still feels like he is having an aura. Pt is a x 4. In NAD. ED MDs made aware. Reporting they will order patient ativan.

## 2013-07-31 NOTE — ED Provider Notes (Signed)
CSN: [id number]     Arrival date & time 07/31/13  8182 History   First MD Initiated Contact with Patient 07/31/13 [phone]     Chief Complaint  Patient presents with  . Medication Management     (Consider location/radiation/quality/duration/timing/severity/associated sxs/prior Treatment) Patient is a 58 y.o. male presenting with general illness. The history is provided by the patient.  Illness Location:  General Quality:  Aura Severity:  Mild Onset quality:  Gradual Duration:  3 hours Timing:  Intermittent Progression:  Resolved Chronicity:  Recurrent Context:  Pt has seizure d/o, states takeS Keppra, Dilantin, Depakote normally, has been out of his Depakote for 3 days d/t pharmacy issues, today saw colors, his usual aura, but resolved, then came to hospital Relieved by:  Time Worsened by:  None Ineffective treatments:  None tried Associated symptoms: no abdominal pain, no chest pain, no congestion, no cough, no diarrhea, no fever, no headaches, no loss of consciousness, no nausea, no rash, no rhinorrhea, no shortness of breath, no vomiting and no wheezing   Risk factors:  Hx of epilepsy, states has been sleeping well, avoiding recent drug uses, avoiding ETOH, avoiding caffeine   Past Medical History  Diagnosis Date  . Seizures     since age 42  . Diabetes mellitus   . Blindness of right eye   . Epilepsy     As reported by patient   History reviewed. No pertinent past surgical history. Family History  Problem Relation Age of Onset  . Diabetes Mother   . Hypertension Mother    History  Substance Use Topics  . Smoking status: Current Some Day Smoker    Types: Cigarettes    Last Attempt to Quit: 11/06/2010  . Smokeless tobacco: Never Used  . Alcohol Use: Yes     Comment: occ--four times a week--beer x 2 40 oz    Review of Systems  Constitutional: Negative for fever, activity change and appetite change.  HENT: Negative for congestion and rhinorrhea.   Eyes: Negative for  discharge and itching.  Respiratory: Negative for cough, shortness of breath and wheezing.   Cardiovascular: Negative for chest pain.  Gastrointestinal: Negative for nausea, vomiting, abdominal pain, diarrhea and constipation.  Genitourinary: Negative for hematuria, decreased urine volume and difficulty urinating.  Skin: Negative for rash and wound.  Neurological: Negative for loss of consciousness, syncope, weakness, numbness and headaches.  All other systems reviewed and are negative.      Allergies  Review of patient's allergies indicates no known allergies.  Home Medications   Current Outpatient Rx  Name  Route  Sig  Dispense  Refill  . acetaminophen (TYLENOL) 325 MG tablet   Oral   Take 325 mg by mouth every 6 (six) hours as needed. For pain         . divalproex (DEPAKOTE ER) 500 MG 24 hr tablet   Oral   Take 3,500 mg by mouth daily. Take 7 tablets by mouth daily         . levETIRAcetam (KEPPRA) 500 MG tablet   Oral   Take 1 tablet (500 mg total) by mouth 2 (two) times daily.   60 tablet   12   . metFORMIN (GLUCOPHAGE) 500 MG tablet   Oral   Take 500 mg by mouth daily with breakfast.          . oxyCODONE-acetaminophen (PERCOCET/ROXICET) 5-325 MG per tablet   Oral   Take 1-2 tablets by mouth every 6 (six) hours as needed for  severe pain.   15 tablet   0   . phenytoin (DILANTIN) 100 MG ER capsule   Oral   Take 200 mg by mouth 2 (two) times daily.         . phenytoin (DILANTIN) 50 MG tablet   Oral   Chew 1 tablet (50 mg total) by mouth at bedtime. Take along with 200mg  Dilantin.   30 tablet   12   . divalproex (DEPAKOTE) 500 MG DR tablet   Oral   Take 1 tablet (500 mg total) by mouth daily.   21 tablet   0    BP 131/93  Pulse 64  Temp(Src) 98.1 F (36.7 C) (Oral)  Resp 13  Ht 5\' 5"  (1.651 m)  Wt 145 lb (65.772 kg)  BMI 24.13 kg/m2  SpO2 99% Physical Exam  Vitals reviewed. Constitutional: He is oriented to person, place, and time. He  appears well-developed and well-nourished. No distress.  HENT:  Head: Normocephalic and atraumatic.  Mouth/Throat: Oropharynx is clear and moist. No oropharyngeal exudate.  Eyes: Conjunctivae and EOM are normal. Pupils are equal, round, and reactive to light. Right eye exhibits no discharge. Left eye exhibits no discharge. No scleral icterus.  Neck: Normal range of motion. Neck supple.  Cardiovascular: Normal rate, regular rhythm, normal heart sounds and intact distal pulses.  Exam reveals no gallop and no friction rub.   No murmur heard. Pulmonary/Chest: Effort normal and breath sounds normal. No respiratory distress. He has no wheezes. He has no rales.  Abdominal: Soft. He exhibits no distension and no mass. There is no tenderness.  Musculoskeletal: Normal range of motion.  Neurological: He is alert and oriented to person, place, and time. No cranial nerve deficit. He exhibits normal muscle tone. Coordination normal.  Blind in R eye with lid lag, chronic per patient  5/5 strength in all exts, normal sensation in all exts, 2+ DTRs in patella and brachioradilias b/l, ambulatory without ataxia, CNs II-XII intact with exception of above  Skin: Skin is warm. No rash noted. He is not diaphoretic.    ED Course  Procedures (including critical care time) Labs Review Labs Reviewed  VALPROIC ACID LEVEL - Abnormal; Notable for the following:    Valproic Acid Lvl <10.0 (*)    All other components within normal limits  BASIC METABOLIC PANEL - Abnormal; Notable for the following:    BUN 25 (*)    All other components within normal limits   Imaging Review No results found.   EKG Interpretation None      MDM   MDM: 58 y.o AAM w./ PMHx of seizures, DM, w/ cc: of aura. Pt takes Keppra, Depakote, Dilantin, has been out of depakote for 3 days as pharmacy was closed d/t holiday he states. Has been taking other meds. Today saw colors, which is his normal aura and was concerned so came to hopsital.  No seizure activity in 6 months. No symptoms currently. Denies f/c, neck pain/stiffness, abd pain, n/v/d, chest pain, SOB, numbness, weakness. Smokes, but no recent EtOH or drug use, been sleeping well, no caffeine use. Currently feels well. AFVSS, no meningismus, normal exam with no acute deficits, well appearing. Will give Depakote here, check lytes and level and observe. BMP WNL, no concerning findings. Well appaering here, NAD, no further aura or seizure activity. Eating and walking here. Will discharge with rx for Depakote. Return to ED if develop worsening sxs. Discharged. Care of case d/w my attending.  Final diagnoses:  Seizure disorder  Discharged    Sol Passer, MD 07/31/13 1010

## 2013-07-31 NOTE — ED Notes (Signed)
Pt provided turkey sandwich and water.

## 2013-08-02 ENCOUNTER — Ambulatory Visit (INDEPENDENT_AMBULATORY_CARE_PROVIDER_SITE_OTHER): Payer: PRIVATE HEALTH INSURANCE | Admitting: Neurology

## 2013-08-02 ENCOUNTER — Telehealth: Payer: Self-pay | Admitting: *Deleted

## 2013-08-02 ENCOUNTER — Encounter: Payer: Self-pay | Admitting: Neurology

## 2013-08-02 VITALS — BP 138/70 | HR 72 | Temp 98.0°F | Resp 18 | Ht 65.0 in | Wt 140.3 lb

## 2013-08-02 DIAGNOSIS — G40209 Localization-related (focal) (partial) symptomatic epilepsy and epileptic syndromes with complex partial seizures, not intractable, without status epilepticus: Secondary | ICD-10-CM

## 2013-08-02 NOTE — Telephone Encounter (Signed)
Medications for Depakote Keppra and Dilantin  Were called into [facility] pharmacy  for a year supply

## 2013-08-02 NOTE — Progress Notes (Signed)
NEUROLOGY FOLLOW UP OFFICE NOTE  [name] [id number]  HISTORY OF PRESENT ILLNESS: [name] is a 58 year old right-handed man with diabetes mellitus and right eye blindness who presents for follow-up regarding epilepsy.  Records and images were personally reviewed where available.    UPDATE: On 07/31/13, he had a seizure.  He had switched pharmacies and ran out of his Depakote.  He had not taken his Depakote for 3 days and was unable to contact the office on Friday, because the office was closed for Good Friday.  He went to the ED and his depakote level was undetectable.  He was loaded and given some medications until his follow up with me.  Last visit, recommended starting D3 2000 IU daily, which he has taken for around 2 months.  He hasn't started calcium.  HISTORY: He began having seizures at age 67.  It may have been precipitated by a head injury after falling out of his crib.    Semiology:  Aura of colored lights in right visual field.  It may last several minutes.  Usually the warning of the aura gives him time to lay down and call the ambulance.  Sometimes it resolves, while other times it progresses to a full-blown seizure, described as loss of consciousness, generalized jerking and tongue biting.  He averages approximately 1-2 seizures per year.  But he says last seizure was around 1 to 1.5 years ago.  Current medication:  Depakote ER 4000mg  daily, Keppra 500mg  BID, Dilantin 250mg  qd. Prior medication:  phenobarbital.  Higher doses of Dilantin caused ataxia.  09/20/09 MRI Brain w/o:  mild generalized atrophy with prominence to the cerebellum.  Post-operative changes of right globe. 02/01/13 LABS:  WBC 7.2, HGB 14.2, HCT 42.3, PLT 196, Na 148, K 4.2, glucose 73, BUN 18, Cr 1.0, AP 47, TB 0.6, AST 23, ALT 18, TP 7.3, ALB 4, Ca 9.1, phenytoin level 8.4, VA 89.6, vitamin D 25-hydroxy 18.  PAST MEDICAL HISTORY: Past Medical History  Diagnosis Date  . Seizures     since age 35  .  Diabetes mellitus   . Blindness of right eye   . Epilepsy     As reported by patient    MEDICATIONS: Current Outpatient Prescriptions on File Prior to Visit  Medication Sig Dispense Refill  . acetaminophen (TYLENOL) 325 MG tablet Take 325 mg by mouth every 6 (six) hours as needed. For pain      . divalproex (DEPAKOTE ER) 500 MG 24 hr tablet Take 3,500 mg by mouth daily. Take 7 tablets by mouth daily      . divalproex (DEPAKOTE) 500 MG DR tablet Take 7 tablets (3,500 mg total) by mouth daily.  147 tablet  0  . levETIRAcetam (KEPPRA) 500 MG tablet Take 1 tablet (500 mg total) by mouth 2 (two) times daily.  60 tablet  12  . metFORMIN (GLUCOPHAGE) 500 MG tablet Take 500 mg by mouth daily with breakfast.       . oxyCODONE-acetaminophen (PERCOCET/ROXICET) 5-325 MG per tablet Take 1-2 tablets by mouth every 6 (six) hours as needed for severe pain.  15 tablet  0  . phenytoin (DILANTIN) 100 MG ER capsule Take 200 mg by mouth 2 (two) times daily.      . phenytoin (DILANTIN) 50 MG tablet Chew 1 tablet (50 mg total) by mouth at bedtime. Take along with 200mg  Dilantin.  30 tablet  12   No current facility-administered medications on file prior to visit.  ALLERGIES: No Known Allergies  FAMILY HISTORY: Family History  Problem Relation Age of Onset  . Diabetes Mother   . Hypertension Mother     SOCIAL HISTORY: History   Social History  . Marital Status: Single    Spouse Name: N/A    Number of Children: N/A  . Years of Education: N/A   Occupational History  . Not on file.   Social History Main Topics  . Smoking status: Current Some Day Smoker    Types: Cigarettes    Last Attempt to Quit: 11/06/2010  . Smokeless tobacco: Never Used  . Alcohol Use: Yes     Comment: occ--four times a week--beer x 2 40 oz  . Drug Use: Yes     Comment: cocaine--6 months ago   . Sexual Activity: Not on file   Other Topics Concern  . Not on file   Social History Narrative  . No narrative on file      REVIEW OF SYSTEMS: Constitutional: No fevers, chills, or sweats, no generalized fatigue, change in appetite Eyes: No visual changes, double vision, eye pain Ear, nose and throat: No hearing loss, ear pain, nasal congestion, sore throat Cardiovascular: No chest pain, palpitations Respiratory:  No shortness of breath at rest or with exertion, wheezes GastrointestinaI: No nausea, vomiting, diarrhea, abdominal pain, fecal incontinence Genitourinary:  No dysuria, urinary retention or frequency Musculoskeletal:  Left knee pain Integumentary: No rash, pruritus, skin lesions Neurological: as above Psychiatric: No depression, insomnia, anxiety Endocrine: No palpitations, fatigue, diaphoresis, mood swings, change in appetite, change in weight, increased thirst Hematologic/Lymphatic:  No anemia, purpura, petechiae. Allergic/Immunologic: no itchy/runny eyes, nasal congestion, recent allergic reactions, rashes  PHYSICAL EXAM: Filed Vitals:   08/02/13 1354  BP: 138/70  Pulse: 72  Temp: 98 F (36.7 C)  Resp: 18   General: No acute distress Head:  Normocephalic/atraumatic Neck: supple, no paraspinal tenderness, full range of motion Heart:  Regular rate and rhythm Lungs:  Clear to auscultation bilaterally Back: No paraspinal tenderness Neurological Exam: alert and oriented to person, place, and time. Attention span and concentration intact, recent and remote memory intact, fund of knowledge intact.  Speech fluent and not dysarthric, language intact.  Right eye blindness and non-reactive right pupil, otherwise CN II-XII intact. Fundi not visualized.  Bulk and tone normal, muscle strength 5/5 throughout.  Sensation to light touch intact.  Deep tendon reflexes 2+ throughout.  Finger to nose and heel to shin testing intact.  Gait normal, Romberg negative.  IMPRESSION: Simple partial seizures with secondary generalization. Breakthrough seizure due to subtherapeutic level  PLAN: 1.  Continue  Dilantin ER 250mg  daily, Keppra 500mg  twice daily and Depakote ER 4000mg  daily.  Will consider weaning off Dilantin next visit. 2.  Take calcium 600mg  twice daily and D3 2000 IU daily 3.  Follow up in 6 months.  Metta Clines, DO

## 2013-08-02 NOTE — Patient Instructions (Signed)
1.  Continue the Depakote ER 4000mg  daily, Keppra 500mg  twice daily and Dilantin 250mg  daily. 2.  Take vitamin D3 2000 units daily.  Also take calcium 600mg  twice daily 3.  Follow up in 6 months.

## 2013-08-30 ENCOUNTER — Encounter (HOSPITAL_COMMUNITY): Payer: Self-pay | Admitting: Emergency Medicine

## 2013-08-30 ENCOUNTER — Emergency Department (HOSPITAL_COMMUNITY)
Admission: EM | Admit: 2013-08-30 | Discharge: 2013-08-30 | Disposition: A | Discharge status: Home / Self Care | Payer: PRIVATE HEALTH INSURANCE | Attending: Emergency Medicine | Admitting: Emergency Medicine

## 2013-08-30 DIAGNOSIS — F17200 Nicotine dependence, unspecified, uncomplicated: Secondary | ICD-10-CM | POA: Insufficient documentation

## 2013-08-30 DIAGNOSIS — G8929 Other chronic pain: Secondary | ICD-10-CM | POA: Insufficient documentation

## 2013-08-30 DIAGNOSIS — M79606 Pain in leg, unspecified: Secondary | ICD-10-CM

## 2013-08-30 DIAGNOSIS — M79609 Pain in unspecified limb: Secondary | ICD-10-CM | POA: Insufficient documentation

## 2013-08-30 DIAGNOSIS — Z79899 Other long term (current) drug therapy: Secondary | ICD-10-CM | POA: Insufficient documentation

## 2013-08-30 DIAGNOSIS — G40909 Epilepsy, unspecified, not intractable, without status epilepticus: Secondary | ICD-10-CM | POA: Insufficient documentation

## 2013-08-30 DIAGNOSIS — E119 Type 2 diabetes mellitus without complications: Secondary | ICD-10-CM | POA: Insufficient documentation

## 2013-08-30 MED ORDER — HYDROCODONE-ACETAMINOPHEN 5-325 MG PO TABS
2.0000 | ORAL_TABLET | Freq: Once | ORAL | Status: AC
  Administered 2013-08-30: 2 via ORAL
  Filled 2013-08-30: qty 2

## 2013-08-30 MED ORDER — HYDROCODONE-ACETAMINOPHEN 5-325 MG PO TABS: 1.0000 | ORAL_TABLET | Freq: Four times a day (QID) | ORAL | Status: DC | PRN

## 2013-08-30 NOTE — Progress Notes (Signed)
VASCULAR LAB PRELIMINARY  PRELIMINARY  PRELIMINARY  PRELIMINARY  Left lower extremity venous duplex completed.    Preliminary report:  Left:  No evidence of DVT, superficial thrombosis, or Baker's cyst.  Nani Ravens, RVT 08/30/2013, 3:53 PM

## 2013-08-30 NOTE — Discharge Instructions (Signed)
Musculoskeletal Pain °Musculoskeletal pain is muscle and boney aches and pains. These pains can occur in any part of the body. Your caregiver may treat you without knowing the cause of the pain. They may treat you if blood or urine tests, X-rays, and other tests were normal.  °CAUSES °There is often not a definite cause or reason for these pains. These pains may be caused by a type of germ (virus). The discomfort may also come from overuse. Overuse includes working out too hard when your body is not fit. Boney aches also come from weather changes. Bone is sensitive to atmospheric pressure changes. °HOME CARE INSTRUCTIONS  °· Ask when your test results will be ready. Make sure you get your test results. °· Only take over-the-counter or prescription medicines for pain, discomfort, or fever as directed by your caregiver. If you were given medications for your condition, do not drive, operate machinery or power tools, or sign legal documents for 24 hours. Do not drink alcohol. Do not take sleeping pills or other medications that may interfere with treatment. °· Continue all activities unless the activities cause more pain. When the pain lessens, slowly resume normal activities. Gradually increase the intensity and duration of the activities or exercise. °· During periods of severe pain, bed rest may be helpful. Lay or sit in any position that is comfortable. °· Putting ice on the injured area. °· Put ice in a bag. °· Place a towel between your skin and the bag. °· Leave the ice on for 15 to 20 minutes, 3 to 4 times a day. °· Follow up with your caregiver for continued problems and no reason can be found for the pain. If the pain becomes worse or does not go away, it may be necessary to repeat tests or do additional testing. Your caregiver may need to look further for a possible cause. °SEEK IMMEDIATE MEDICAL CARE IF: °· You have pain that is getting worse and is not relieved by medications. °· You develop chest pain  that is associated with shortness or breath, sweating, feeling sick to your stomach (nauseous), or throw up (vomit). °· Your pain becomes localized to the abdomen. °· You develop any new symptoms that seem different or that concern you. °MAKE SURE YOU:  °· Understand these instructions. °· Will watch your condition. °· Will get help right away if you are not doing well or get worse. °Document Released: 04/13/2005 Document Revised: 07/06/2011 Document Reviewed: 12/16/2012 °[employer]® Patient Information ©2014 [employer]. ° °

## 2013-08-30 NOTE — ED Provider Notes (Addendum)
CSN: [id number]     Arrival date & time 08/30/13  1119 History   First MD Initiated Contact with Patient 08/30/13 1256     Chief Complaint  Patient presents with  . Leg Pain     (Consider location/radiation/quality/duration/timing/severity/associated sxs/prior Treatment) Patient is a 58 y.o. male presenting with leg pain. The history is provided by the patient.  Leg Pain Location:  Leg Injury: no   Leg location:  L lower leg Pain details:    Quality:  Aching   Severity:  Moderate   Onset quality:  Gradual   Timing:  Constant   Progression:  Worsening Chronicity:  Chronic Dislocation: no   Relieved by:  Nothing Worsened by:  Nothing tried Associated symptoms: no fever     Past Medical History  Diagnosis Date  . Seizures     since age 82  . Diabetes mellitus   . Blindness of right eye   . Epilepsy     As reported by patient   History reviewed. No pertinent past surgical history. Family History  Problem Relation Age of Onset  . Diabetes Mother   . Hypertension Mother    History  Substance Use Topics  . Smoking status: Current Some Day Smoker    Types: Cigarettes    Last Attempt to Quit: 11/06/2010  . Smokeless tobacco: Never Used  . Alcohol Use: Yes     Comment: occ--four times a week--beer x 2 40 oz    Review of Systems  Constitutional: Negative for fever.  Respiratory: Negative for shortness of breath.   Gastrointestinal: Negative for vomiting and diarrhea.  All other systems reviewed and are negative.     Allergies  Review of patient's allergies indicates no known allergies.  Home Medications   Prior to Admission medications   Medication Sig Start Date End Date Taking? Authorizing Provider  acetaminophen (TYLENOL) 325 MG tablet Take 325 mg by mouth every 6 (six) hours as needed. For pain    Historical Provider, MD  divalproex (DEPAKOTE ER) 500 MG 24 hr tablet Take 3,500 mg by mouth daily. Take 7 tablets by mouth daily    Historical Provider, MD   divalproex (DEPAKOTE) 500 MG DR tablet Take 7 tablets (3,500 mg total) by mouth daily. 07/31/13   Sol Passer, MD  levETIRAcetam (KEPPRA) 500 MG tablet Take 1 tablet (500 mg total) by mouth 2 (two) times daily. 10/12/12 10/12/13  Legrand Como L. Tomma Rakers, MD  metFORMIN (GLUCOPHAGE) 500 MG tablet Take 500 mg by mouth daily with breakfast.     Historical Provider, MD  oxyCODONE-acetaminophen (PERCOCET/ROXICET) 5-325 MG per tablet Take 1-2 tablets by mouth every 6 (six) hours as needed for severe pain. 04/05/13   Elwyn Lade, PA-C  phenytoin (DILANTIN) 100 MG ER capsule Take 200 mg by mouth 2 (two) times daily.    Historical Provider, MD  phenytoin (DILANTIN) 50 MG tablet Chew 1 tablet (50 mg total) by mouth at bedtime. Take along with 200mg  Dilantin. 10/12/12 10/12/13  Legrand Como L. Soo, MD   BP 118/78  Pulse 80  Temp(Src) 98.1 F (36.7 C)  Resp 18  SpO2 99% Physical Exam  Nursing note and vitals reviewed. Constitutional: He is oriented to person, place, and time. He appears well-developed and well-nourished. No distress.  HENT:  Head: Normocephalic and atraumatic.  Mouth/Throat: No oropharyngeal exudate.  Eyes: EOM are normal. Pupils are equal, round, and reactive to light.  Neck: Normal range of motion. Neck supple.  Cardiovascular: Normal rate and regular rhythm.  Exam reveals no friction rub.   No murmur heard. Pulmonary/Chest: Effort normal and breath sounds normal. No respiratory distress. He has no wheezes. He has no rales.  Abdominal: He exhibits no distension. There is no tenderness. There is no rebound.  Musculoskeletal: Normal range of motion. He exhibits tenderness (L calf muscular tenderness). He exhibits no edema (no erythema or swelling of L calf).  Neurological: He is alert and oriented to person, place, and time.  Skin: He is not diaphoretic.    ED Course  Procedures (including critical care time) Labs Review Labs Reviewed - No data to display  Imaging Review No results  found.   EKG Interpretation None      VASCULAR LAB  PRELIMINARY PRELIMINARY PRELIMINARY PRELIMINARY  Left lower extremity venous duplex completed.  Preliminary report: Left: No evidence of DVT, superficial thrombosis, or Baker's cyst.  Nani Ravens, RVT  08/30/2013, 3:53 PM     MDM   Final diagnoses:  Leg pain    16M here with chronic L leg pain. Hx of negative [place], but 5 months ago. His aide felt he needed another DVT scan today. His symptoms are worse with walking, alleviated with rest - concern for possible claudication. DVT study negative. Instructed to f/u with PCP and given pain meds.      Osvaldo Shipper, MD 08/30/13 [phone]

## 2013-08-30 NOTE — ED Notes (Signed)
Per pt sts LLE pain in calf area x 6 days. sts also a little numb in area.

## 2013-08-30 NOTE — ED Notes (Signed)
Patient stated he wanted to leave. Explained risks of leaving and called Ultrasound stated will perform approximately 1530. Patient stated will stay.

## 2013-08-30 NOTE — ED Notes (Signed)
Pt states he sees a Dr who gives him injections in his hip for pain.  Pt states the pain in his upper leg is gone, but now he has pain in the calf.

## 2013-10-30 ENCOUNTER — Other Ambulatory Visit: Payer: Self-pay | Admitting: *Deleted

## 2013-10-30 MED ORDER — PHENYTOIN SODIUM EXTENDED 100 MG PO CAPS: 200.0000 mg | ORAL_CAPSULE | Freq: Every day | ORAL | Status: DC

## 2013-11-03 ENCOUNTER — Encounter (HOSPITAL_COMMUNITY): Payer: Self-pay | Admitting: Emergency Medicine

## 2013-11-03 ENCOUNTER — Emergency Department (HOSPITAL_COMMUNITY)
Admission: EM | Admit: 2013-11-03 | Discharge: 2013-11-03 | Disposition: A | Discharge status: Home / Self Care | Payer: PRIVATE HEALTH INSURANCE | Attending: Emergency Medicine | Admitting: Emergency Medicine

## 2013-11-03 ENCOUNTER — Emergency Department (HOSPITAL_COMMUNITY): Payer: PRIVATE HEALTH INSURANCE

## 2013-11-03 DIAGNOSIS — M545 Low back pain, unspecified: Secondary | ICD-10-CM | POA: Diagnosis present

## 2013-11-03 DIAGNOSIS — X58XXXA Exposure to other specified factors, initial encounter: Secondary | ICD-10-CM | POA: Insufficient documentation

## 2013-11-03 DIAGNOSIS — E119 Type 2 diabetes mellitus without complications: Secondary | ICD-10-CM | POA: Diagnosis not present

## 2013-11-03 DIAGNOSIS — F17200 Nicotine dependence, unspecified, uncomplicated: Secondary | ICD-10-CM | POA: Insufficient documentation

## 2013-11-03 DIAGNOSIS — S335XXA Sprain of ligaments of lumbar spine, initial encounter: Secondary | ICD-10-CM | POA: Diagnosis not present

## 2013-11-03 DIAGNOSIS — G40909 Epilepsy, unspecified, not intractable, without status epilepticus: Secondary | ICD-10-CM | POA: Diagnosis not present

## 2013-11-03 DIAGNOSIS — Z79899 Other long term (current) drug therapy: Secondary | ICD-10-CM | POA: Insufficient documentation

## 2013-11-03 DIAGNOSIS — Y939 Activity, unspecified: Secondary | ICD-10-CM | POA: Insufficient documentation

## 2013-11-03 DIAGNOSIS — S39012A Strain of muscle, fascia and tendon of lower back, initial encounter: Secondary | ICD-10-CM

## 2013-11-03 DIAGNOSIS — Y929 Unspecified place or not applicable: Secondary | ICD-10-CM | POA: Diagnosis not present

## 2013-11-03 DIAGNOSIS — H5440 Blindness, one eye, unspecified eye: Secondary | ICD-10-CM | POA: Diagnosis not present

## 2013-11-03 LAB — URINALYSIS, ROUTINE W REFLEX MICROSCOPIC
Bilirubin Urine: NEGATIVE
GLUCOSE, UA: NEGATIVE mg/dL
Hgb urine dipstick: NEGATIVE
KETONES UR: 15 mg/dL — AB
LEUKOCYTES UA: NEGATIVE
Nitrite: NEGATIVE
PROTEIN: NEGATIVE mg/dL
Specific Gravity, Urine: 1.031 — ABNORMAL HIGH (ref 1.005–1.030)
UROBILINOGEN UA: 0.2 mg/dL (ref 0.0–1.0)
pH: 6 (ref 5.0–8.0)

## 2013-11-03 MED ORDER — HYDROCODONE-ACETAMINOPHEN 5-325 MG PO TABS: 1.0000 | ORAL_TABLET | Freq: Four times a day (QID) | ORAL | Status: DC | PRN

## 2013-11-03 MED ORDER — HYDROCODONE-ACETAMINOPHEN 5-325 MG PO TABS
1.0000 | ORAL_TABLET | Freq: Once | ORAL | Status: AC
  Administered 2013-11-03: 1 via ORAL
  Filled 2013-11-03: qty 1

## 2013-11-03 NOTE — ED Notes (Signed)
Patient states lower left back pain for three days, states he feels it is swollen. Patient denies fall or injury; Denies kidney stones

## 2013-11-03 NOTE — ED Provider Notes (Signed)
CSN: [id number]     Arrival date & time 11/03/13  0916 History  This chart was scribed for non-physician practitioner, Jamse Mead, PA-C,working with Orpah Greek, MD, by Marlowe Kays, ED Scribe.  This patient was seen in room TR08C/TR08C and the patient's care was started at 10:48 AM.   Chief Complaint  Patient presents with  . Back Pain   The history is provided by the patient. No language interpreter was used.   HPI Comments:  [name] is a 58 y.o. male with h/o epilepsy and DM  who presents to the Emergency Department complaining of localized constant, moderate lower, left-sided back pain that he describes as sore onset three days ago upon waking. He reports associated swelling of the area. He states walking and standing makes the pain worse. He reports increased pain with deep breaths. He denies taking anything for the pain or doing anything to alleviate his pain. He denies bowel or bladder incontinence, dysuria, hematuria, fever, chills, CP, SOB, numbness or tingling, nausea or vomiting. His CBG was 133 when he checked it at home earlier this morning. He denies any falls, injury, trauma or heavy lifting. Pt was sleeping comfortably upon entrance to the room. Pt's PCP is at the [facility]. His orthopedist is Dr. Jacelyn Grip and has an appt in one week.   Past Medical History  Diagnosis Date  . Seizures     since age 58  . Diabetes mellitus   . Blindness of right eye   . Epilepsy     As reported by patient   History reviewed. No pertinent past surgical history. Family History  Problem Relation Age of Onset  . Diabetes Mother   . Hypertension Mother    History  Substance Use Topics  . Smoking status: Current Some Day Smoker    Types: Cigarettes    Last Attempt to Quit: 11/06/2010  . Smokeless tobacco: Never Used  . Alcohol Use: Yes     Comment: occ--four times a week--beer x 2 40 oz    Review of Systems  Constitutional: Negative for fever and  chills.  Respiratory: Negative for shortness of breath.   Cardiovascular: Negative for chest pain.  Gastrointestinal: Negative for nausea and vomiting.  Genitourinary: Negative for dysuria, frequency, hematuria, decreased urine volume and difficulty urinating.  Musculoskeletal: Positive for back pain.  Neurological: Negative for numbness.    Allergies  Review of patient's allergies indicates no known allergies.  Home Medications   Prior to Admission medications   Medication Sig Start Date End Date Taking? Authorizing Provider  divalproex (DEPAKOTE ER) 500 MG 24 hr tablet Take 3,500 mg by mouth daily. Take 7 tablets by mouth daily   Yes Historical Provider, MD  levETIRAcetam (KEPPRA) 500 MG tablet Take 500 mg by mouth 2 (two) times daily.   Yes Historical Provider, MD  metFORMIN (GLUCOPHAGE) 500 MG tablet Take 500 mg by mouth daily with breakfast.    Yes Historical Provider, MD  phenytoin (DILANTIN) 100 MG ER capsule Take 2 capsules (200 mg total) by mouth daily. 10/30/13  Yes Adam Melvern Sample, DO  phenytoin (DILANTIN) 50 MG tablet Chew 50 mg by mouth at bedtime.   Yes Historical Provider, MD  HYDROcodone-acetaminophen (NORCO/VICODIN) 5-325 MG per tablet Take 1 tablet by mouth every 6 (six) hours as needed for moderate pain or severe pain. 11/03/13   Elisandro Jarrett, PA-C   Triage Vitals: BP 129/81  Pulse 66  Temp(Src) 98.2 F (36.8 C) (Oral)  Resp 18  SpO2 99% Physical Exam  Nursing note and vitals reviewed. Constitutional: He is oriented to person, place, and time. He appears well-developed and well-nourished. No distress.  Patient sitting, doubly upright in chair  HENT:  Head: Normocephalic and atraumatic.  Mouth/Throat: Oropharynx is clear and moist. No oropharyngeal exudate.  Eyes: Conjunctivae and EOM are normal. Pupils are equal, round, and reactive to light. Right eye exhibits no discharge. Left eye exhibits no discharge.  Neck: Normal range of motion. Neck supple. No  tracheal deviation present.  Negative neck stiffness Negative nuchal rigidity Negative cervical lymphadenopathy Negative meningeal signs Negative pain upon palpation to the C-spine  Cardiovascular: Normal rate, regular rhythm and normal heart sounds.  Exam reveals no friction rub.   No murmur heard. Pulses:      Radial pulses are 2+ on the right side, and 2+ on the left side.       Dorsalis pedis pulses are 2+ on the right side, and 2+ on the left side.  Pulmonary/Chest: Effort normal and breath sounds normal. No respiratory distress. He has no wheezes. He has no rales.  Musculoskeletal: He exhibits tenderness.       Lumbar back: He exhibits tenderness, bony tenderness and swelling (Left paravertebral of the lumbosacral spine). He exhibits normal range of motion, no edema, no deformity and no laceration.       Back:  Negative deformities identified to the spine. Discomfort upon palpation to the lumbosacral spine, mid spinal and left paravertebral regions. Mild swelling identified to the left paravertebral region with discomfort upon palpation. Negative fluctuance-no findings of abscess. Full range of motion to upper and lower extremities bilaterally without difficulty noted. Full flexion, extension, abduction and abduction noted to the hip. Full flexion extension noted to the knee.  Lymphadenopathy:    He has no cervical adenopathy.  Neurological: He is alert and oriented to person, place, and time. No cranial nerve deficit. He exhibits normal muscle tone. Coordination normal.  Cranial nerves III-XII grossly intact Strength 5+/5+ to upper and lower extremities bilaterally with resistance applied, equal distribution noted Sensation intact with differentiation to sharp and dull touch Negative facial drooping Negative slurred speech Negative aphasia Equal grip strength bilaterally Negative saddle paresthesias bilaterally Gait proper with-negative step-offs or sway-negative ataxia gait  identified  Skin: Skin is warm and dry. No rash noted. He is not diaphoretic. No erythema.  Psychiatric: He has a normal mood and affect. His behavior is normal.    ED Course  Procedures (including critical care time) DIAGNOSTIC STUDIES: Oxygen Saturation is 99% on RA, normal by my interpretation.   COORDINATION OF CARE: 10:59 AM- Will X-Ray L-Spine and order pain medication. Pt verbalizes understanding and agrees to plan.  Medications  HYDROcodone-acetaminophen (NORCO/VICODIN) 5-325 MG per tablet 1 tablet (1 tablet Oral Given 11/03/13 1121)   Results for orders placed during the hospital encounter of 11/03/13  URINALYSIS, ROUTINE W REFLEX MICROSCOPIC      Result Value Ref Range   Color, Urine YELLOW  YELLOW   APPearance CLEAR  CLEAR   Specific Gravity, Urine 1.031 (*) 1.005 - 1.030   pH 6.0  5.0 - 8.0   Glucose, UA NEGATIVE  NEGATIVE mg/dL   Hgb urine dipstick NEGATIVE  NEGATIVE   Bilirubin Urine NEGATIVE  NEGATIVE   Ketones, ur 15 (*) NEGATIVE mg/dL   Protein, ur NEGATIVE  NEGATIVE mg/dL   Urobilinogen, UA 0.2  0.0 - 1.0 mg/dL   Nitrite NEGATIVE  NEGATIVE   Leukocytes, UA  NEGATIVE  NEGATIVE    Labs Review Labs Reviewed  URINALYSIS, ROUTINE W REFLEX MICROSCOPIC - Abnormal; Notable for the following:    Specific Gravity, Urine 1.031 (*)    Ketones, ur 15 (*)    All other components within normal limits    Imaging Review Dg Lumbar Spine Complete  11/03/2013   CLINICAL DATA:  Back pain.  EXAM: LUMBAR SPINE - COMPLETE 4+ VIEW  COMPARISON:  None.  FINDINGS: Paraspinal soft tissues are unremarkable. Grade 1 L4-L5 spondylolisthesis noted. No other focal bony abnormality identified. Diffuse degenerative change present. Aortoiliac atherosclerotic vascular disease.  IMPRESSION: 1. Grade 1 L4-L5 spondylolisthesis. 2. Aortoiliac atherosclerotic vascular disease.   Electronically Signed   By: Marcello Moores  Register   On: 11/03/2013 12:06   Dg Hip Bilateral W/pelvis  11/03/2013   CLINICAL  DATA:  Back pain.  EXAM: BILATERAL HIP WITH PELVIS - 4+ VIEW  COMPARISON:  03/12/2012 .  FINDINGS: Degenerative changes lumbar spine and both hips. No acute bony or joint abnormality.  IMPRESSION: Degenerative changes lumbar spine and both hips. No acute abnormality.   Electronically Signed   By: Marcello Moores  Register   On: 11/03/2013 12:09     EKG Interpretation None      MDM   Final diagnoses:  Lumbar strain, initial encounter  Low back pain without sciatica, unspecified back pain laterality    Medications  HYDROcodone-acetaminophen (NORCO/VICODIN) 5-325 MG per tablet 1 tablet (1 tablet Oral Given 11/03/13 1121)    Filed Vitals:   11/03/13 0947 11/03/13 1121 11/03/13 1258  BP: 129/81 123/79 139/78  Pulse: 66 66 73  Temp: 98.2 F (36.8 C)    TempSrc: Oral    Resp: 18 20 18   SpO2: 99% 100% 100%   I personally performed the services described in this documentation, which was scribed in my presence. The recorded information has been reviewed and is accurate.  Urinalysis negative for hemoglobin, nitrites and leukocytes-negative findings of infection. Specific gravity mildly elevated at 1.031. Plain film of lumbar spine noted grade 1 L4-L5 spondylolisthesis, atherosclerotic vascular disease noted. Plain film of bilateral hip with pelvis noted degenerative changes of the lumbar spine and both hips-no acute abnormalities noted. Doubt pyelonephritis. Doubt urinary tract infection. Doubt cauda equina. Doubt epidural abscess. Suspicion to be lumbar strain secondary to work activity-works at [employer] and does heavy lifting. Negative focal neurological deficits noted. Sensation intact. Gait proper with-negative step offs or sway-negative ataxia gait. Pain medications administered in ED setting. Patient stable, afebrile. Patient not septic appearing. Discharged patient. Referred patient to primary care provider and orthopedics-discussed with patient to keep appointment next Friday with Dr. Jacelyn Grip.  Discussed with patient to massage with icy hot ointment. Discussed with patient to avoid any physical strenuous activity. Discussed with patient to closely monitor symptoms and if symptoms are to worsen or change to report back to the ED - strict return instructions given.  Patient agreed to plan of care, understood, all questions answered.   Jamse Mead, PA-C 11/03/13 1843

## 2013-11-03 NOTE — ED Notes (Signed)
Patient's aid, Lesly Rubenstein, was in the room.  He advised to call him when patient is ready to be discharged.   [phone]

## 2013-11-03 NOTE — Discharge Instructions (Signed)
Please call your doctor for a followup appointment within 24-48 hours. When you talk to your doctor please let them know that you were seen in the emergency department and have them acquire all of your records so that they can discuss the findings with you and formulate a treatment plan to fully care for your new and ongoing problems. Please call and set up an appointment with your primary care provider to be seen and reassessed Please keep appointment with your orthopedic surgeon next week to be seen and reassessed Please avoid any physical strenuous activity Please avoid any heavy lifting Please apply icy hot ointment and massage Please apply warm compressions While on pain medications his be no drinking alcohol, driving, operating any heavy machinery. If there is extra please disposer proper manner. While on pain medication please do not take any extra Tylenol for this can become overdose and liver issues. Please continue to monitor symptoms closely if symptoms are to worsen or change (fever greater than 101, chills, chest pain, shortness of breath, difficulty breathing, numbness, tingling, fall, injury, worsening or changes to pain symptoms, flank pain, inability to control urine or bowel movements, weakness, loss of sensation) please report back to the ED immediately  Back Pain, Adult Low back pain is very common. About 1 in 5 people have back pain.The cause of low back pain is rarely dangerous. The pain often gets better over time.About half of people with a sudden onset of back pain feel better in just 2 weeks. About 8 in 10 people feel better by 6 weeks.  CAUSES Some common causes of back pain include:  Strain of the muscles or ligaments supporting the spine.  Wear and tear (degeneration) of the spinal discs.  Arthritis.  Direct injury to the back. DIAGNOSIS Most of the time, the direct cause of low back pain is not known.However, back pain can be treated effectively even when the  exact cause of the pain is unknown.Answering your caregiver's questions about your overall health and symptoms is one of the most accurate ways to make sure the cause of your pain is not dangerous. If your caregiver needs more information, he or she may order lab work or imaging tests (X-rays or MRIs).However, even if imaging tests show changes in your back, this usually does not require surgery. HOME CARE INSTRUCTIONS For many people, back pain returns.Since low back pain is rarely dangerous, it is often a condition that people can learn to [facility] their own.   Remain active. It is stressful on the back to sit or stand in one place. Do not sit, drive, or stand in one place for more than 30 minutes at a time. Take short walks on level surfaces as soon as pain allows.Try to increase the length of time you walk each day.  Do not stay in bed.Resting more than 1 or 2 days can delay your recovery.  Do not avoid exercise or work.Your body is made to move.It is not dangerous to be active, even though your back may hurt.Your back will likely heal faster if you return to being active before your pain is gone.  Pay attention to your body when you bend and lift. Many people have less discomfortwhen lifting if they bend their knees, keep the load close to their bodies,and avoid twisting. Often, the most comfortable positions are those that put less stress on your recovering back.  Find a comfortable position to sleep. Use a firm mattress and lie on your side with your  knees slightly bent. If you lie on your back, put a pillow under your knees.  Only take over-the-counter or prescription medicines as directed by your caregiver. Over-the-counter medicines to reduce pain and inflammation are often the most helpful.Your caregiver may prescribe muscle relaxant drugs.These medicines help dull your pain so you can more quickly return to your normal activities and healthy exercise.  Put ice on the injured  area.  Put ice in a plastic bag.  Place a towel between your skin and the bag.  Leave the ice on for 15-20 minutes, 03-04 times a day for the first 2 to 3 days. After that, ice and heat may be alternated to reduce pain and spasms.  Ask your caregiver about trying back exercises and gentle massage. This may be of some benefit.  Avoid feeling anxious or stressed.Stress increases muscle tension and can worsen back pain.It is important to recognize when you are anxious or stressed and learn ways to manage it.Exercise is a great option. SEEK MEDICAL CARE IF:  You have pain that is not relieved with rest or medicine.  You have pain that does not improve in 1 week.  You have new symptoms.  You are generally not feeling well. SEEK IMMEDIATE MEDICAL CARE IF:   You have pain that radiates from your back into your legs.  You develop new bowel or bladder control problems.  You have unusual weakness or numbness in your arms or legs.  You develop nausea or vomiting.  You develop abdominal pain.  You feel faint. Document Released: 04/13/2005 Document Revised: 10/13/2011 Document Reviewed: 09/01/2010 [facility] Patient Information 2015 [place], [place]. This information is not intended to replace advice given to you by your health care provider. Make sure you discuss any questions you have with your health care provider.   Back Exercises Back exercises help treat and prevent back injuries. The goal is to increase your strength in your belly (abdominal) and back muscles. These exercises can also help with flexibility. Start these exercises when told by your doctor. HOME CARE Back exercises include: Pelvic Tilt.  Lie on your back with your knees bent. Tilt your pelvis until the lower part of your back is against the floor. Hold this position 5 to 10 sec. Repeat this exercise 5 to 10 times. Knee to Chest.  Pull 1 knee up against your chest and hold for 20 to 30 seconds. Repeat this with  the other knee. This may be done with the other leg straight or bent, whichever feels better. Then, pull both knees up against your chest. Sit-Ups or Curl-Ups.  Bend your knees 90 degrees. Start with tilting your pelvis, and do a partial, slow sit-up. Only lift your upper half 30 to 45 degrees off the floor. Take at least 2 to 3 seonds for each sit-up. Do not do sit-ups with your knees out straight. If partial sit-ups are difficult, simply do the above but with only tightening your belly (abdominal) muscles and holding it as told. Hip-Lift.  Lie on your back with your knees flexed 90 degrees. Push down with your feet and shoulders as you raise your hips 2 inches off the floor. Hold for 10 seconds, repeat 5 to 10 times. Back Arches.  Lie on your stomach. Prop yourself up on bent elbows. Slowly press on your hands, causing an arch in your low back. Repeat 3 to 5 times. Shoulder-Lifts.  Lie face down with arms beside your body. Keep hips and belly pressed to floor as you  slowly lift your head and shoulders off the floor. Do not overdo your exercises. Be careful in the beginning. Exercises may cause you some mild back discomfort. If the pain lasts for more than 15 minutes, stop the exercises until you see your doctor. Improvement with exercise for back problems is slow.  Document Released: 05/16/2010 Document Revised: 07/06/2011 Document Reviewed: 02/12/2011 [facility] Patient Information 2015 [place], [place]. This information is not intended to replace advice given to you by your health care provider. Make sure you discuss any questions you have with your health care provider.

## 2013-11-06 NOTE — ED Provider Notes (Signed)
Medical screening examination/treatment/procedure(s) were performed by non-physician practitioner and as supervising physician I was immediately available for consultation/collaboration.   EKG Interpretation None        Orpah Greek, MD 11/06/13 1539

## 2013-12-20 ENCOUNTER — Emergency Department (HOSPITAL_COMMUNITY)
Admission: EM | Admit: 2013-12-20 | Discharge: 2013-12-20 | Disposition: A | Discharge status: Home / Self Care | Payer: PRIVATE HEALTH INSURANCE | Attending: Family Medicine | Admitting: Family Medicine

## 2013-12-20 ENCOUNTER — Encounter (HOSPITAL_COMMUNITY): Payer: Self-pay | Admitting: Emergency Medicine

## 2013-12-20 DIAGNOSIS — L02419 Cutaneous abscess of limb, unspecified: Secondary | ICD-10-CM | POA: Diagnosis not present

## 2013-12-20 DIAGNOSIS — G40909 Epilepsy, unspecified, not intractable, without status epilepticus: Secondary | ICD-10-CM | POA: Insufficient documentation

## 2013-12-20 DIAGNOSIS — Y929 Unspecified place or not applicable: Secondary | ICD-10-CM | POA: Insufficient documentation

## 2013-12-20 DIAGNOSIS — T25229A Burn of second degree of unspecified foot, initial encounter: Secondary | ICD-10-CM | POA: Diagnosis not present

## 2013-12-20 DIAGNOSIS — Y939 Activity, unspecified: Secondary | ICD-10-CM | POA: Insufficient documentation

## 2013-12-20 DIAGNOSIS — H5440 Blindness, one eye, unspecified eye: Secondary | ICD-10-CM | POA: Diagnosis not present

## 2013-12-20 DIAGNOSIS — IMO0002 Reserved for concepts with insufficient information to code with codable children: Secondary | ICD-10-CM

## 2013-12-20 DIAGNOSIS — F17200 Nicotine dependence, unspecified, uncomplicated: Secondary | ICD-10-CM | POA: Diagnosis not present

## 2013-12-20 DIAGNOSIS — L03115 Cellulitis of right lower limb: Secondary | ICD-10-CM

## 2013-12-20 DIAGNOSIS — T25029A Burn of unspecified degree of unspecified foot, initial encounter: Secondary | ICD-10-CM | POA: Insufficient documentation

## 2013-12-20 DIAGNOSIS — E119 Type 2 diabetes mellitus without complications: Secondary | ICD-10-CM | POA: Diagnosis not present

## 2013-12-20 DIAGNOSIS — X12XXXA Contact with other hot fluids, initial encounter: Secondary | ICD-10-CM | POA: Diagnosis not present

## 2013-12-20 DIAGNOSIS — Z79899 Other long term (current) drug therapy: Secondary | ICD-10-CM | POA: Insufficient documentation

## 2013-12-20 DIAGNOSIS — Z792 Long term (current) use of antibiotics: Secondary | ICD-10-CM | POA: Insufficient documentation

## 2013-12-20 DIAGNOSIS — X131XXA Other contact with steam and other hot vapors, initial encounter: Secondary | ICD-10-CM

## 2013-12-20 DIAGNOSIS — L03119 Cellulitis of unspecified part of limb: Secondary | ICD-10-CM

## 2013-12-20 MED ORDER — LEVOFLOXACIN 500 MG PO TABS: 500.0000 mg | ORAL_TABLET | Freq: Every day | ORAL | Status: DC
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
## 2013-12-20 NOTE — ED Notes (Addendum)
PT dropped hot foot on foot 2 days ago; no signs of infection or discharge; foot is swollen; pt diabetic. MD looked at foot before placing in FT for appropriateness.

## 2013-12-20 NOTE — Discharge Instructions (Signed)
You have cellulitis or a skin infection of the foot. This is from the skin breakdown Please remember to do the we to dry bandages until the underside of the skin heals over Please come back if needed  Cellulitis Cellulitis is an infection of the skin and the tissue beneath it. The infected area is usually red and tender. Cellulitis occurs most often in the arms and lower legs.  CAUSES  Cellulitis is caused by bacteria that enter the skin through cracks or cuts in the skin. The most common types of bacteria that cause cellulitis are staphylococci and streptococci. SIGNS AND SYMPTOMS   Redness and warmth.  Swelling.  Tenderness or pain.  Fever. DIAGNOSIS  Your health care provider can usually determine what is wrong based on a physical exam. Blood tests may also be done. TREATMENT  Treatment usually involves taking an antibiotic medicine. HOME CARE INSTRUCTIONS   Take your antibiotic medicine as directed by your health care provider. Finish the antibiotic even if you start to feel better.  Keep the infected arm or leg elevated to reduce swelling.  Apply a warm cloth to the affected area up to 4 times per day to relieve pain.  Take medicines only as directed by your health care provider.  Keep all follow-up visits as directed by your health care provider. SEEK MEDICAL CARE IF:   You notice red streaks coming from the infected area.  Your red area gets larger or turns dark in color.  Your bone or joint underneath the infected area becomes painful after the skin has healed.  Your infection returns in the same area or another area.  You notice a swollen bump in the infected area.  You develop new symptoms.  You have a fever. SEEK IMMEDIATE MEDICAL CARE IF:   You feel very sleepy.  You develop vomiting or diarrhea.  You have a general ill feeling (malaise) with muscle aches and pains. MAKE SURE YOU:   Understand these instructions.  Will watch your  condition.  Will get help right away if you are not doing well or get worse. Document Released: 01/21/2005 Document Revised: 08/28/2013 Document Reviewed: 06/29/2011 [facility] Patient Information 2015 [place], [place]. This information is not intended to replace advice given to you by your health care provider. Make sure you discuss any questions you have with your health care provider.

## 2014-01-13 ENCOUNTER — Encounter (HOSPITAL_COMMUNITY): Payer: Self-pay | Admitting: Emergency Medicine

## 2014-01-13 ENCOUNTER — Emergency Department (HOSPITAL_COMMUNITY)
Admission: EM | Admit: 2014-01-13 | Discharge: 2014-01-13 | Disposition: A | Discharge status: Home / Self Care | Payer: PRIVATE HEALTH INSURANCE | Attending: Emergency Medicine | Admitting: Emergency Medicine

## 2014-01-13 DIAGNOSIS — R799 Abnormal finding of blood chemistry, unspecified: Secondary | ICD-10-CM | POA: Insufficient documentation

## 2014-01-13 DIAGNOSIS — G40909 Epilepsy, unspecified, not intractable, without status epilepticus: Secondary | ICD-10-CM | POA: Diagnosis not present

## 2014-01-13 DIAGNOSIS — Z9001 Acquired absence of eye: Secondary | ICD-10-CM | POA: Insufficient documentation

## 2014-01-13 DIAGNOSIS — Z79899 Other long term (current) drug therapy: Secondary | ICD-10-CM | POA: Diagnosis not present

## 2014-01-13 DIAGNOSIS — E119 Type 2 diabetes mellitus without complications: Secondary | ICD-10-CM | POA: Insufficient documentation

## 2014-01-13 DIAGNOSIS — H5440 Blindness, one eye, unspecified eye: Secondary | ICD-10-CM | POA: Diagnosis not present

## 2014-01-13 DIAGNOSIS — R7889 Finding of other specified substances, not normally found in blood: Secondary | ICD-10-CM

## 2014-01-13 DIAGNOSIS — R42 Dizziness and giddiness: Secondary | ICD-10-CM | POA: Insufficient documentation

## 2014-01-13 DIAGNOSIS — Z0189 Encounter for other specified special examinations: Secondary | ICD-10-CM | POA: Insufficient documentation

## 2014-01-13 DIAGNOSIS — Z970 Presence of artificial eye: Secondary | ICD-10-CM | POA: Insufficient documentation

## 2014-01-13 LAB — BASIC METABOLIC PANEL
ANION GAP: 12 (ref 5–15)
BUN: 24 mg/dL — ABNORMAL HIGH (ref 6–23)
CALCIUM: 8.8 mg/dL (ref 8.4–10.5)
CO2: 26 mEq/L (ref 19–32)
CREATININE: 0.75 mg/dL (ref 0.50–1.35)
Chloride: 108 mEq/L (ref 96–112)
GFR calc Af Amer: >90 mL/min (ref >90)
GFR calc non Af Amer: >90 mL/min (ref >90)
Glucose, Bld: 81 mg/dL (ref 70–99)
Potassium: 4.6 mEq/L (ref 3.7–5.3)
Sodium: 146 mEq/L (ref 137–147)

## 2014-01-13 LAB — CBC WITH DIFFERENTIAL/PLATELET
BASOS ABS: 0 10*3/uL (ref 0.0–0.1)
Basophils Relative: 1 % (ref 0–1)
EOS PCT: 2 % (ref 0–5)
Eosinophils Absolute: 0.1 10*3/uL (ref 0.0–0.7)
HEMATOCRIT: 42.7 % (ref 39.0–52.0)
Hemoglobin: 14 g/dL (ref 13.0–17.0)
Lymphocytes Relative: 50 % — ABNORMAL HIGH (ref 12–46)
Lymphs Abs: 2.8 10*3/uL (ref 0.7–4.0)
MCH: 30.4 pg (ref 26.0–34.0)
MCHC: 32.8 g/dL (ref 30.0–36.0)
MCV: 92.8 fL (ref 78.0–100.0)
MONO ABS: 0.5 10*3/uL (ref 0.1–1.0)
Monocytes Relative: 9 % (ref 3–12)
Neutro Abs: 2.1 10*3/uL (ref 1.7–7.7)
Neutrophils Relative %: 38 % — ABNORMAL LOW (ref 43–77)
PLATELETS: 229 10*3/uL (ref 150–400)
RBC: 4.6 MIL/uL (ref 4.22–5.81)
RDW: 14.6 % (ref 11.5–15.5)
WBC: 5.6 10*3/uL (ref 4.0–10.5)

## 2014-01-13 LAB — PHENYTOIN LEVEL, TOTAL: Phenytoin Lvl: 8.5 ug/mL — ABNORMAL LOW (ref 10.0–20.0)

## 2014-01-13 LAB — VALPROIC ACID LEVEL: Valproic Acid Lvl: 59.6 ug/mL (ref 50.0–100.0)

## 2014-01-13 MED ORDER — PHENYTOIN SODIUM EXTENDED 100 MG PO CAPS
200.0000 mg | ORAL_CAPSULE | Freq: Once | ORAL | Status: AC
  Administered 2014-01-13: 200 mg via ORAL
  Filled 2014-01-13: qty 2

## 2014-01-13 NOTE — Discharge Instructions (Signed)
Please read and follow all provided instructions.  Your diagnoses today include:  1. Dilantin level too low     Tests performed today include:  Blood counts and electrolytes - normal  Depakote level - normal  Dilantin level - slightly low  Vital signs. See below for your results today.   Medications prescribed:   None  Take any prescribed medications only as directed.  Home care instructions:  Follow any educational materials contained in this packet.  Follow-up instructions: Please follow-up with your primary care provider in the next 3 days for further evaluation of your symptoms. Please follow-up with your neurologist as planned.   Return instructions:   Please return to the Emergency Department if you experience worsening symptoms.   Please return if you have any other emergent concerns.  Additional Information:  Your vital signs today were: BP 120/68   Pulse 70   Temp(Src) 98.3 F (36.8 C) (Oral)   Resp 16   SpO2 99% If your blood pressure (BP) was elevated above 135/85 this visit, please have this repeated by your doctor within one month. --------------

## 2014-01-13 NOTE — ED Notes (Signed)
Pt from home via [facility] with c/o visual auras x 3 days which is normal for pt prior to getting seizures.  Pt states he has not missed a single dose of his keppra, Depakote, or dilantin, has taken extra and still feels like "something is wrong" and is requesting his therapeutic levels checked.  Pt has hx of DM, epilepsy, and right eye vision loss.  Reports a neurology appointment on the 7th.  Pt in NAD, A&O.

## 2014-01-13 NOTE — ED Notes (Signed)
Pt remains monitored by blood pressure, pulse ox, and 12 lead. Seizure pads remain in place.

## 2014-01-13 NOTE — ED Provider Notes (Signed)
CSN: [id number]     Arrival date & time 01/13/14  1051 History   First MD Initiated Contact with Patient 01/13/14 1116     Chief Complaint  Patient presents with  . Lab check     (Consider location/radiation/quality/duration/timing/severity/associated sxs/prior Treatment) HPI Comments: Patients with history of seizure disorder since age 58 on Keppra, Depakote, Dilantin -- history of right eye blindness, history of diabetes -- presents with complaint of feeling poorly like he may have a seizure, for the past several days. Patient reports auras prior to having a seizure. Last seizure was one week ago. Patient has occasional breakthrough seizures. He states he has been compliant with all of his medications. States that Depakote may have been changed recently but is unsure to what. He is here today because he would like to have his levels checked. Otherwise he does not have any other neurological symptoms including weakness in arms or legs, trouble walking. The onset of this condition was acute. The course is constant. Aggravating factors: none. Alleviating factors: none.    The history is provided by the patient and medical records.    Past Medical History  Diagnosis Date  . Seizures     since age 85  . Diabetes mellitus   . Blindness of right eye   . Epilepsy     As reported by patient   History reviewed. No pertinent past surgical history. Family History  Problem Relation Age of Onset  . Diabetes Mother   . Hypertension Mother    History  Substance Use Topics  . Smoking status: Current Some Day Smoker    Types: Cigarettes    Last Attempt to Quit: 11/06/2010  . Smokeless tobacco: Never Used  . Alcohol Use: Yes     Comment: occ--four times a week--beer x 2 40 oz    Review of Systems  Constitutional: Negative for fever.  HENT: Negative for rhinorrhea and sore throat.   Eyes: Negative for redness.  Respiratory: Negative for cough.   Cardiovascular: Negative for chest pain.   Gastrointestinal: Negative for nausea, vomiting, abdominal pain and diarrhea.  Genitourinary: Negative for dysuria.  Musculoskeletal: Negative for myalgias.  Skin: Negative for rash.  Neurological: Positive for light-headedness. Negative for dizziness, tremors, seizures, syncope, facial asymmetry, speech difficulty, weakness, numbness and headaches.      Allergies  Review of patient's allergies indicates no known allergies.  Home Medications   Prior to Admission medications   Medication Sig Start Date End Date Taking? Authorizing Provider  divalproex (DEPAKOTE ER) 500 MG 24 hr tablet Take 4,000 mg by mouth daily.    Yes Historical Provider, MD  levETIRAcetam (KEPPRA) 500 MG tablet Take 500 mg by mouth 2 (two) times daily.   Yes Historical Provider, MD  metFORMIN (GLUCOPHAGE) 500 MG tablet Take 500 mg by mouth daily with breakfast.    Yes Historical Provider, MD  phenytoin (DILANTIN) 100 MG ER capsule Take 2 capsules (200 mg total) by mouth daily. 10/30/13  Yes Adam Melvern Sample, DO  phenytoin (DILANTIN) 50 MG tablet Chew 50 mg by mouth at bedtime.   Yes Historical Provider, MD   BP 140/87  Pulse 68  Temp(Src) 98.3 F (36.8 C) (Oral)  Resp 17  SpO2 100%  Physical Exam  Nursing note and vitals reviewed. Constitutional: He is oriented to person, place, and time. He appears well-developed and well-nourished.  HENT:  Head: Normocephalic and atraumatic.  Right Ear: Tympanic membrane, external ear and ear canal normal.  Left Ear: Tympanic  membrane, external ear and ear canal normal.  Nose: Nose normal.  Mouth/Throat: Uvula is midline, oropharynx is clear and moist and mucous membranes are normal.  Eyes: Conjunctivae, EOM and lids are normal.  L eye reactive to light. R eye prosthesis.   Neck: Normal range of motion. Neck supple.  Cardiovascular: Normal rate and regular rhythm.   Pulmonary/Chest: Effort normal and breath sounds normal.  Abdominal: Soft. There is no tenderness.   Musculoskeletal: Normal range of motion.       Cervical back: He exhibits normal range of motion, no tenderness and no bony tenderness.  Neurological: He is alert and oriented to person, place, and time. He has normal strength and normal reflexes. No cranial nerve deficit or sensory deficit. He exhibits normal muscle tone. He displays a negative Romberg sign. Coordination and gait normal. GCS eye subscore is 4. GCS verbal subscore is 5. GCS motor subscore is 6.  Skin: Skin is warm and dry.  Psychiatric: He has a normal mood and affect.    ED Course  Procedures (including critical care time) Labs Review Labs Reviewed  CBC WITH DIFFERENTIAL - Abnormal; Notable for the following:    Neutrophils Relative % 38 (*)    Lymphocytes Relative 50 (*)    All other components within normal limits  BASIC METABOLIC PANEL - Abnormal; Notable for the following:    BUN 24 (*)    All other components within normal limits  PHENYTOIN LEVEL, TOTAL - Abnormal; Notable for the following:    Phenytoin Lvl 8.5 (*)    All other components within normal limits  VALPROIC ACID LEVEL    Imaging Review No results found.   EKG Interpretation None      11:39 AM Patient seen and examined. Work-up initiated. Medications ordered.   Vital signs reviewed and are as follows: BP 140/87  Pulse 68  Temp(Src) 98.3 F (36.8 C) (Oral)  Resp 17  SpO2 100%  1:25 PM Dilantin is sub therapeutic, labs otherwise unremarkable. Findings discussed with Dr. Stevie Kern. Additional dose dilantin given.   Patient urged to return with worsening symptoms or other concerns. Patient verbalized understanding and agrees with plan.     MDM   Final diagnoses:  Dilantin level too low   Patient has not had a seizure, but feels like he might. He is concerned that his levels are low. Dilantin found to be mildly subtherapeutic -- he was given additional dose. He is otherwise fine and safe for d/c to home. He has upcoming neuro  appointment in early October.   No dangerous or life-threatening conditions suspected or identified by history, physical exam, and by work-up. No indications for hospitalization identified.      Carlisle Cater, PA-C 01/13/14 1329

## 2014-01-15 ENCOUNTER — Encounter (HOSPITAL_COMMUNITY): Payer: Self-pay | Admitting: Emergency Medicine

## 2014-01-15 ENCOUNTER — Emergency Department (HOSPITAL_COMMUNITY)
Admission: EM | Admit: 2014-01-15 | Discharge: 2014-01-15 | Disposition: A | Discharge status: Home / Self Care | Payer: PRIVATE HEALTH INSURANCE | Attending: Emergency Medicine | Admitting: Emergency Medicine

## 2014-01-15 DIAGNOSIS — R29818 Other symptoms and signs involving the nervous system: Secondary | ICD-10-CM

## 2014-01-15 DIAGNOSIS — Z79899 Other long term (current) drug therapy: Secondary | ICD-10-CM | POA: Diagnosis not present

## 2014-01-15 DIAGNOSIS — E119 Type 2 diabetes mellitus without complications: Secondary | ICD-10-CM | POA: Insufficient documentation

## 2014-01-15 DIAGNOSIS — R569 Unspecified convulsions: Secondary | ICD-10-CM | POA: Diagnosis present

## 2014-01-15 DIAGNOSIS — H5440 Blindness, one eye, unspecified eye: Secondary | ICD-10-CM | POA: Diagnosis not present

## 2014-01-15 DIAGNOSIS — F17200 Nicotine dependence, unspecified, uncomplicated: Secondary | ICD-10-CM | POA: Insufficient documentation

## 2014-01-15 DIAGNOSIS — G40109 Localization-related (focal) (partial) symptomatic epilepsy and epileptic syndromes with simple partial seizures, not intractable, without status epilepticus: Secondary | ICD-10-CM | POA: Insufficient documentation

## 2014-01-15 DIAGNOSIS — G40909 Epilepsy, unspecified, not intractable, without status epilepticus: Secondary | ICD-10-CM

## 2014-01-15 LAB — I-STAT CHEM 8, ED
BUN: 18 mg/dL (ref 6–23)
CREATININE: 0.8 mg/dL (ref 0.50–1.35)
Calcium, Ion: 1.15 mmol/L (ref 1.12–1.23)
Chloride: 107 mEq/L (ref 96–112)
Glucose, Bld: 163 mg/dL — ABNORMAL HIGH (ref 70–99)
HCT: 40 % (ref 39.0–52.0)
Hemoglobin: 13.6 g/dL (ref 13.0–17.0)
Potassium: 3.7 mEq/L (ref 3.7–5.3)
SODIUM: 142 meq/L (ref 137–147)
TCO2: 27 mmol/L (ref 0–100)

## 2014-01-15 LAB — PHENYTOIN LEVEL, TOTAL: Phenytoin Lvl: 9.8 ug/mL — ABNORMAL LOW (ref 10.0–20.0)

## 2014-01-15 LAB — VALPROIC ACID LEVEL: VALPROIC ACID LVL: 72.7 ug/mL (ref 50.0–100.0)

## 2014-01-15 MED ORDER — MORPHINE SULFATE 4 MG/ML IJ SOLN: 4.0000 mg | Freq: Once | INTRAMUSCULAR | Status: DC

## 2014-01-15 MED ORDER — LEVETIRACETAM 250 MG PO TABS: 250.0000 mg | ORAL_TABLET | Freq: Two times a day (BID) | ORAL | Status: DC

## 2014-01-15 MED ORDER — SODIUM CHLORIDE 0.9 % IV BOLUS (SEPSIS)
1000.0000 mL | Freq: Once | INTRAVENOUS | Status: AC
  Administered 2014-01-15: 1000 mL via INTRAVENOUS

## 2014-01-15 MED ORDER — SODIUM CHLORIDE 0.9 % IV SOLN
200.0000 mg | Freq: Once | INTRAVENOUS | Status: AC
  Administered 2014-01-15: 200 mg via INTRAVENOUS
  Filled 2014-01-15: qty 4

## 2014-01-15 NOTE — ED Notes (Signed)
rn attempted IV with [place], IV infiltrated. IV team paged

## 2014-01-15 NOTE — ED Notes (Signed)
Patient refused blood draw for labs. RN made aware

## 2014-01-15 NOTE — ED Notes (Signed)
Seizure pads put in place at 12:40

## 2014-01-15 NOTE — ED Provider Notes (Signed)
CSN: [id number]     Arrival date & time 01/15/14  1046 History   First MD Initiated Contact with Patient 01/15/14 1506     Chief Complaint  Patient presents with  . feels like oncoming seizure   . seizure auras      (Consider location/radiation/quality/duration/timing/severity/associated sxs/prior Treatment) The history is provided by the patient.     Patient with hx seizure disorder p/w typical aura that has been occuring intermittently x 2 days.  Was seen in ED two days ago for same.  States he is taking his medication as directed.  The aura consists of a headache that is across his forehead and top of his head, mostly on the right side, and he sees colors flashing with his right eye.  This is unchanged from his typical aura.   States he is taking his medications as directed.  Denies any other changes in his medications.  Denies recent illness, fever, focal neurologic deficits.  States he usually has a seizure once/month.  Last seizure was appoximately 1 week ago.    Was seen in ED 2 days ago and was found to have low dilantin level.      Past Medical History  Diagnosis Date  . Seizures     since age 58  . Diabetes mellitus   . Blindness of right eye   . Epilepsy     As reported by patient   History reviewed. No pertinent past surgical history. Family History  Problem Relation Age of Onset  . Diabetes Mother   . Hypertension Mother    History  Substance Use Topics  . Smoking status: Current Some Day Smoker    Types: Cigarettes    Last Attempt to Quit: 11/06/2010  . Smokeless tobacco: Never Used  . Alcohol Use: Yes     Comment: occ--four times a week--beer x 2 40 oz    Review of Systems  All other systems reviewed and are negative.     Allergies  Review of patient's allergies indicates no known allergies.  Home Medications   Prior to Admission medications   Medication Sig Start Date End Date Taking? Authorizing Provider  divalproex (DEPAKOTE ER) 500 MG 24 hr  tablet Take 4,000 mg by mouth daily.    Yes Historical Provider, MD  levETIRAcetam (KEPPRA) 500 MG tablet Take 500 mg by mouth 2 (two) times daily.   Yes Historical Provider, MD  metFORMIN (GLUCOPHAGE) 500 MG tablet Take 500 mg by mouth daily with breakfast.    Yes Historical Provider, MD  phenytoin (DILANTIN) 100 MG ER capsule Take 2 capsules (200 mg total) by mouth daily. 10/30/13  Yes Adam Melvern Sample, DO  phenytoin (DILANTIN) 50 MG tablet Chew 50 mg by mouth at bedtime.   Yes Historical Provider, MD   BP 120/88  Pulse 63  Temp(Src) 98.3 F (36.8 C) (Oral)  Resp 13  SpO2 99% Physical Exam  Nursing note and vitals reviewed. Constitutional: He appears well-developed and well-nourished. No distress.  HENT:  Head: Normocephalic and atraumatic.  Neck: Normal range of motion. Neck supple.  Cardiovascular: Normal rate, regular rhythm and intact distal pulses.   Pulmonary/Chest: Effort normal and breath sounds normal. No respiratory distress. He has no wheezes. He has no rales.  Abdominal: Soft. He exhibits no distension and no mass. There is no tenderness. There is no rebound and no guarding.  Musculoskeletal: Normal range of motion. He exhibits no edema.  Neurological: He is alert. He exhibits normal muscle tone.  CN II-XII intact, EOMs intact, no pronator drift, grip strengths equal bilaterally; strength 5/5 in all extremities, sensation intact in all extremities; finger to nose, heel to shin, rapid alternating movements normal.  No truncal ataxia.      Skin: He is not diaphoretic.  Healing wound on right dorsal foot.  No erythema, edema, warmth, tenderness, discharge.   Psychiatric: He has a normal mood and affect. His behavior is normal. Thought content normal.    ED Course  Procedures (including critical care time) Labs Review Labs Reviewed  PHENYTOIN LEVEL, TOTAL - Abnormal; Notable for the following:    Phenytoin Lvl 9.8 (*)    All other components within normal limits  I-STAT  CHEM 8, ED - Abnormal; Notable for the following:    Glucose, Bld 163 (*)    All other components within normal limits  VALPROIC ACID LEVEL    Imaging Review No results found.   EKG Interpretation None       Date: 01/16/2014  Rate: 62  Rhythm: normal sinus rhythm  QRS Axis: normal  Intervals: normal  ST/T Wave abnormalities: normal  Conduction Disutrbances:none  Narrative Interpretation:   Old EKG Reviewed: none available    8:25 PM Patient reports he is feeling better and would like to be discharged home.   8:38 PM  Discussed pt with Dr Leonel Ramsay.  He recommends no change to dilantin dosage.  Recommends increase Keppra to 750 BID.    MDM   Final diagnoses:  Aura  Seizure disorder    Afebrile, nontoxic patient with seizure disorder, presenting with concern that he might have a seizure as he is having his typical aura.  Dilantin level low, given home dose of dilantin IV, given norco for headache.  IVF given for lightheadedness.  EKG unremarkable. Pt is not orthostatic.  Blood work performed two days ago unremarkable.  Discussed medications with Dr Leonel Ramsay.   D/C home with increase in [place], follow up with his neurologist Oct 7.   Discussed result, findings, treatment, and follow up  with patient.  Pt given return precautions.  Pt verbalizes understanding and agrees with plan.         [place], PA-C 01/16/14 0040

## 2014-01-15 NOTE — Discharge Instructions (Signed)
Read the information below.  Use the prescribed medication as directed.  Please discuss all new medications with your pharmacist.  You may return to the Emergency Department at any time for worsening condition or any new symptoms that concern you.   Please take your medications as prescribed.  Follow up with your neurologist as soon as possible. If you have a worsening headache or change in your pain, develop difficulty speaking or walking, or have a seizure, return to the ER for a recheck.    You are having a headache. No specific cause was found today for your headache. It may have been a migraine or other cause of headache. Stress, anxiety, fatigue, and depression are common triggers for headaches. Your headache today does not appear to be life-threatening or require hospitalization, but often the exact cause of headaches is not determined in the emergency department. Therefore, follow-up with your doctor is very important to find out what may have caused your headache, and whether or not you need any further diagnostic testing or treatment. Sometimes headaches can appear benign (not harmful), but then more serious symptoms can develop which should prompt an immediate re-evaluation by your doctor or the emergency department. SEEK MEDICAL ATTENTION IF: You develop possible problems with medications prescribed.  The medications don't resolve your headache, if it recurs , or if you have multiple episodes of vomiting or can't take fluids. You have a change from the usual headache. RETURN IMMEDIATELY IF you develop a sudden, severe headache or confusion, become poorly responsive or faint, develop a fever above 100.56F or problem breathing, have a change in speech, vision, swallowing, or understanding, or develop new weakness, numbness, tingling, incoordination, or have a seizure.   Epilepsy People with epilepsy have times when they shake and jerk uncontrollably (seizures). This happens when there is a sudden  change in brain function. Epilepsy may have many possible causes. Anything that disturbs the normal pattern of brain cell activity can lead to seizures. HOME CARE   Follow your doctor's instructions about driving and safety during normal activities.  Get enough sleep.  Only take medicine as told by your doctor.  Avoid things that you know can cause you to have seizures (triggers).  Write down when your seizures happen and what you remember about each seizure. Write down anything you think may have caused the seizure to happen.  Tell the people you live and work with that you have seizures. Make sure they know how to help you. They should:  Cushion your head and body.  Turn you on your side.  Not restrain you.  Not place anything inside your mouth.  Call for local emergency medical help if there is any question about what has happened.  Keep all follow-up visits with your doctor. This is very important. GET HELP IF:  You get an infection or start to feel sick. You may have more seizures when you are sick.  You are having seizures more often.  Your seizure pattern is changing. GET HELP RIGHT AWAY IF:   A seizure does not stop after a few seconds or minutes.  A seizure causes you to have trouble breathing.  A seizure gives you a very bad headache.  A seizure makes you unable to speak or use a part of your body. Document Released: 02/08/2009 Document Revised: 02/01/2013 Document Reviewed: 11/23/2012 [facility] Patient Information 2015 [place], [place]. This information is not intended to replace advice given to you by your health care provider. Make sure you  discuss any questions you have with your health care provider. ° °

## 2014-01-15 NOTE — ED Notes (Addendum)
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 Pt reports "seizure auras", which includes a intermittent right sided headache and sees colors. Was seen in ED on Saturday for the same. Denies pain. Reports he took all his seizure meds this morning. Has an appointment with neurologist Oct 7.

## 2014-01-17 NOTE — ED Provider Notes (Signed)
Medical screening examination/treatment/procedure(s) were performed by non-physician practitioner and as supervising physician I was immediately available for consultation/collaboration.   EKG Interpretation   Date/Time:  Monday January 15 2014 15:38:04 EDT Ventricular Rate:  62 PR Interval:  149 QRS Duration: 95 QT Interval:  392 QTC Calculation: 398 R Axis:   28 Text Interpretation:  Sinus rhythm RSR' in V1 or V2, right VCD or RVH ED  PHYSICIAN INTERPRETATION AVAILABLE IN [facility] [place] Confirmed by TEST,  Record ([id number]) on 01/17/2014 7:05:46 AM        Francine Graven, DO 01/17/14 1744

## 2014-01-24 NOTE — ED Provider Notes (Signed)
Medical screening examination/treatment/procedure(s) were performed by non-physician practitioner and as supervising physician I was immediately available for consultation/collaboration.   EKG Interpretation None       Lavinia Mcneely M Domingo Fuson, MD 01/24/14 1333 

## 2014-01-31 ENCOUNTER — Ambulatory Visit (INDEPENDENT_AMBULATORY_CARE_PROVIDER_SITE_OTHER): Payer: PRIVATE HEALTH INSURANCE | Admitting: Neurology

## 2014-01-31 ENCOUNTER — Encounter: Payer: Self-pay | Admitting: Neurology

## 2014-01-31 VITALS — BP 110/70 | HR 68 | Resp 18 | Wt 142.3 lb

## 2014-01-31 DIAGNOSIS — G40409 Other generalized epilepsy and epileptic syndromes, not intractable, without status epilepticus: Secondary | ICD-10-CM

## 2014-01-31 DIAGNOSIS — G40109 Localization-related (focal) (partial) symptomatic epilepsy and epileptic syndromes with simple partial seizures, not intractable, without status epilepticus: Secondary | ICD-10-CM

## 2014-01-31 LAB — HEPATIC FUNCTION PANEL
ALK PHOS: 60 U/L (ref 39–117)
ALT: 17 U/L (ref 0–53)
AST: 23 U/L (ref 0–37)
Albumin: 4.4 g/dL (ref 3.5–5.2)
BILIRUBIN DIRECT: 0.1 mg/dL (ref 0.0–0.3)
BILIRUBIN TOTAL: 0.4 mg/dL (ref 0.2–1.2)
Indirect Bilirubin: 0.3 mg/dL (ref 0.2–1.2)
Total Protein: 7.6 g/dL (ref 6.0–8.3)

## 2014-01-31 LAB — T4, FREE: Free T4: 1.11 ng/dL (ref 0.80–1.80)

## 2014-01-31 MED ORDER — LEVETIRACETAM 750 MG PO TABS: 750.0000 mg | ORAL_TABLET | Freq: Two times a day (BID) | ORAL | Status: DC

## 2014-01-31 NOTE — Addendum Note (Signed)
Addended by: Charyl Bigger E on: 01/31/2014 03:16 PM   Modules accepted: Orders

## 2014-01-31 NOTE — Progress Notes (Addendum)
NEUROLOGY FOLLOW UP OFFICE NOTE  ALONZO OWCZARZAK [id number]  HISTORY OF PRESENT ILLNESS: Quincy Boy is a 58 year old right-handed man with diabetes mellitus and right eye blindness who presents for follow-up regarding localization-related epilepsy.  Records and images were personally reviewed where available.    UPDATE: Last seizure was 01/15/14, but it was only the aura.  In September 2015, he was experiencing his typical aura and headache on and off for 2 days.  He had been compliant with the Depakote and Dilantin.  No recent illness.  He was seen in the ED and was found to have a Dilantin level of 8.5.  He was given an additional dose.  He returned two days later due to persistent symptoms.  His level was 9.8.  He was given another dose of Dilantin and Keppra was increased from 500mg  to 750mg  twice daily.  01/15/14 LABS:  Na 142, K 3.7, Cl 107, BUN 18, Cr 0.80, glucose 163, phenytoin level 9.8, VPA 72.7 01/13/14 LABS:  WBC 5.6, HGB 14, HCT 42.7, PLT 229  HISTORY: He began having seizures at age 68.  It may have been precipitated by a head injury after falling out of his crib.    Semiology:  Aura of colored lights in right visual field.  It may last several minutes.  Usually the warning of the aura gives him time to lay down and call the ambulance.  Sometimes it resolves, while other times it progresses to a full-blown seizure, described as loss of consciousness, generalized jerking and tongue biting.  He averages approximately 1-2 seizures per year.  But he says last seizure was around 1 to 1.5 years ago.  Current medication:  Depakote ER 4000mg  daily, Keppra 750mg  BID, Dilantin 250mg  qd.  Also taking D3 2000 IU daily. Prior medication:  phenobarbital.  Higher doses of Dilantin caused ataxia.   09/20/09 MRI Brain w/o:  mild generalized atrophy with prominence to the cerebellum.  Post-operative changes of right globe. 02/01/13 LABS:  vitamin D 25-hydroxy 18.  PAST MEDICAL HISTORY: Past  Medical History  Diagnosis Date  . Seizures     since age 68  . Diabetes mellitus   . Blindness of right eye   . Epilepsy     As reported by patient    MEDICATIONS: Current Outpatient Prescriptions on File Prior to Visit  Medication Sig Dispense Refill  . divalproex (DEPAKOTE ER) 500 MG 24 hr tablet Take 4,000 mg by mouth daily.       . metFORMIN (GLUCOPHAGE) 500 MG tablet Take 500 mg by mouth daily with breakfast.       . phenytoin (DILANTIN) 100 MG ER capsule Take 2 capsules (200 mg total) by mouth daily.  60 capsule  2  . phenytoin (DILANTIN) 50 MG tablet Chew 50 mg by mouth at bedtime.       No current facility-administered medications on file prior to visit.    ALLERGIES: No Known Allergies  FAMILY HISTORY: Family History  Problem Relation Age of Onset  . Diabetes Mother   . Hypertension Mother     SOCIAL HISTORY: History   Social History  . Marital Status: Single    Spouse Name: N/A    Number of Children: N/A  . Years of Education: N/A   Occupational History  . Not on file.   Social History Main Topics  . Smoking status: Current Some Day Smoker    Types: Cigarettes    Last Attempt to Quit: 11/06/2010  .  Smokeless tobacco: Never Used  . Alcohol Use: Yes     Comment: occ--four times a week--beer x 2 40 oz  . Drug Use: Yes     Comment: cocaine--2 months  . Sexual Activity: Not on file   Other Topics Concern  . Not on file   Social History Narrative  . No narrative on file    REVIEW OF SYSTEMS: Constitutional: No fevers, chills, or sweats, no generalized fatigue, change in appetite Eyes: No visual changes, double vision, eye pain Ear, nose and throat: No hearing loss, ear pain, nasal congestion, sore throat Cardiovascular: No chest pain, palpitations Respiratory:  No shortness of breath at rest or with exertion, wheezes GastrointestinaI: No nausea, vomiting, diarrhea, abdominal pain, fecal incontinence Genitourinary:  No dysuria, urinary retention  or frequency Musculoskeletal:  No neck pain, back pain Integumentary: No rash, pruritus, skin lesions Neurological: as above Psychiatric: No depression, insomnia, anxiety Endocrine: No palpitations, fatigue, diaphoresis, mood swings, change in appetite, change in weight, increased thirst Hematologic/Lymphatic:  No anemia, purpura, petechiae. Allergic/Immunologic: no itchy/runny eyes, nasal congestion, recent allergic reactions, rashes  PHYSICAL EXAM: Filed Vitals:   01/31/14 1447  BP: 110/70  Pulse: 68  Resp: 18   General: No acute distress Head:  Normocephalic/atraumatic Neck: supple, no paraspinal tenderness, full range of motion Heart:  Regular rate and rhythm Lungs:  Clear to auscultation bilaterally Back: No paraspinal tenderness Neurological Exam: alert and oriented to person, place, and time. Attention span and concentration intact, recent and remote memory intact, fund of knowledge intact.  Speech fluent and not dysarthric, language intact.  Right eye blindness, fundi not visualized, otherwise CN II-XII intact, bulk and tone normal, muscle strength 5/5 throughout, sensation to light touch, temperature and vibration intact, deep tendon reflexes 2+ throughout, toes downgoing, finger to nose intact, gait normal, Romberg negative.  IMPRESSION: Simple partial seizures with secondary generalization  PLAN: 1.  Keppra increased to 750mg  twice daily 2.  Continue Dilantin 250mg  daily and Depakote ER 4000mg  daily 3.  Check DEXA scan 4.  Calcium 600mg  twice daily and vitamin D 2000 units daily 5.  Check LFT, TSH and free T4. 6.  Follow up in 6 months.  Metta Clines, DO

## 2014-01-31 NOTE — Patient Instructions (Addendum)
1.  Refill Keppra as 750mg  twice daily. 2.  Continue Dilantin 250mg  daily and Depakote ER 4000mg  daily 3.  Take vitamin D 2000 units daily and calcium 600mg  twice daily 4.  We will get DEXA scan to evaluate for osteoporosis  [address]  02/07/14 1:15 2015 suit 401  5.  Follow up in 6 months.

## 2014-02-05 LAB — THYROTROPIN RECEPTOR AUTOABS: Thyrotropin Receptor Ab: <6 % (ref <=16.0)

## 2014-02-07 ENCOUNTER — Ambulatory Visit
Admission: RE | Admit: 2014-02-07 | Discharge: 2014-02-07 | Disposition: A | Discharge status: Home / Self Care | Payer: PRIVATE HEALTH INSURANCE | Source: Ambulatory Visit | Attending: Neurology | Admitting: Neurology

## 2014-02-07 DIAGNOSIS — G40109 Localization-related (focal) (partial) symptomatic epilepsy and epileptic syndromes with simple partial seizures, not intractable, without status epilepticus: Principal | ICD-10-CM

## 2014-02-07 DIAGNOSIS — G40409 Other generalized epilepsy and epileptic syndromes, not intractable, without status epilepticus: Secondary | ICD-10-CM

## 2014-02-08 ENCOUNTER — Telehealth: Payer: Self-pay | Admitting: *Deleted

## 2014-02-08 NOTE — Telephone Encounter (Signed)
Message copied by Claudie Revering on Thu Feb 08, 2014 10:12 AM ------      Message from: JAFFE, ADAM R      Created: Thu Feb 08, 2014  6:36 AM       DEXA scan is okay      ----- Message -----         From: Rad Results In Interface         Sent: 02/07/2014   4:01 PM           To: Dudley Major, DO                   ------

## 2014-02-08 NOTE — Telephone Encounter (Signed)
patient is aware of normal DEXA scan

## 2014-02-14 ENCOUNTER — Telehealth: Payer: Self-pay | Admitting: *Deleted

## 2014-02-14 ENCOUNTER — Telehealth: Payer: Self-pay | Admitting: Neurology

## 2014-02-14 NOTE — Telephone Encounter (Signed)
Pt called requesting a refill for his [place] states that the pharmacy has attempted to call/fax a request. Please call pt [phone]

## 2014-02-14 NOTE — Telephone Encounter (Signed)
(  DEPAKOTE ER) 500 MG 24 hr tablet              Sig - Route: Take 4,000 mg by mouth daily. 6 refills  dilantin 100 mg 1 po bid total 200 mg qd with 2 refills

## 2014-02-27 ENCOUNTER — Encounter: Payer: Self-pay | Admitting: Neurology

## 2014-03-23 ENCOUNTER — Other Ambulatory Visit: Payer: Self-pay | Admitting: Neurology

## 2014-05-07 ENCOUNTER — Other Ambulatory Visit: Payer: Self-pay | Admitting: Neurology

## 2014-06-02 ENCOUNTER — Other Ambulatory Visit: Payer: Self-pay | Admitting: Neurology

## 2014-06-18 ENCOUNTER — Emergency Department (HOSPITAL_COMMUNITY)
Admission: EM | Admit: 2014-06-18 | Discharge: 2014-06-18 | Disposition: A | Discharge status: Home / Self Care | Payer: Medicare Other | Attending: Emergency Medicine | Admitting: Emergency Medicine

## 2014-06-18 ENCOUNTER — Encounter (HOSPITAL_COMMUNITY): Payer: Self-pay | Admitting: Emergency Medicine

## 2014-06-18 DIAGNOSIS — G40909 Epilepsy, unspecified, not intractable, without status epilepticus: Secondary | ICD-10-CM | POA: Insufficient documentation

## 2014-06-18 DIAGNOSIS — H5411 Blindness, right eye, low vision left eye: Secondary | ICD-10-CM | POA: Insufficient documentation

## 2014-06-18 DIAGNOSIS — Z79899 Other long term (current) drug therapy: Secondary | ICD-10-CM | POA: Diagnosis not present

## 2014-06-18 DIAGNOSIS — E119 Type 2 diabetes mellitus without complications: Secondary | ICD-10-CM | POA: Insufficient documentation

## 2014-06-18 DIAGNOSIS — Z720 Tobacco use: Secondary | ICD-10-CM | POA: Diagnosis not present

## 2014-06-18 DIAGNOSIS — R42 Dizziness and giddiness: Secondary | ICD-10-CM | POA: Insufficient documentation

## 2014-06-18 LAB — VALPROIC ACID LEVEL: Valproic Acid Lvl: 79.7 ug/mL (ref 50.0–100.0)

## 2014-06-18 LAB — BASIC METABOLIC PANEL
ANION GAP: 7 (ref 5–15)
BUN: 25 mg/dL — ABNORMAL HIGH (ref 6–23)
CHLORIDE: 107 mmol/L (ref 96–112)
CO2: 31 mmol/L (ref 19–32)
Calcium: 9.3 mg/dL (ref 8.4–10.5)
Creatinine, Ser: 0.97 mg/dL (ref 0.50–1.35)
GFR calc non Af Amer: 89 mL/min — ABNORMAL LOW (ref >90)
Glucose, Bld: 87 mg/dL (ref 70–99)
Potassium: 4.5 mmol/L (ref 3.5–5.1)
SODIUM: 145 mmol/L (ref 135–145)

## 2014-06-18 LAB — PHENYTOIN LEVEL, TOTAL: Phenytoin Lvl: 12 ug/mL (ref 10.0–20.0)

## 2014-06-18 LAB — CBG MONITORING, ED: GLUCOSE-CAPILLARY: 80 mg/dL (ref 70–99)

## 2014-06-18 MED ORDER — SODIUM CHLORIDE 0.9 % IV BOLUS (SEPSIS)
1000.0000 mL | Freq: Once | INTRAVENOUS | Status: AC
  Administered 2014-06-18: 1000 mL via INTRAVENOUS

## 2014-06-18 NOTE — ED Notes (Signed)
Pt stated when he checked his sugar this morning, it was 87.

## 2014-06-18 NOTE — Discharge Instructions (Signed)
Return to the ED with any concerns including recurrent seizure activity, fainting, chest pain, vomiting and not able to keep down liquids, decreased level of alertness/lethargy, or any other alarming symptoms

## 2014-06-18 NOTE — ED Notes (Signed)
Pt reports dizziness since woke up this am, Hx of seizure. Pt requesting testing Keppra and dilantin levels . Pt ambulated to the room with unsteady gait.

## 2014-06-18 NOTE — ED Provider Notes (Signed)
CSN: [id number]     Arrival date & time 06/18/14  1044 History   First MD Initiated Contact with Patient 06/18/14 1138     No chief complaint on file.    (Consider location/radiation/quality/duration/timing/severity/associated sxs/prior Treatment) HPI  Pt presenting with c/o feeling lightheaded and sensation that he may soon have a seizure.  He states he feels somewhat unsteady on his feet.  States these are the symptoms that he has when he is about to have a seizure.  No headache, no changes in vision.  No vertigo.  No fever/chills.  No vomiting or diarrhea.  States he has been eating and drinking normally.  States he has been taking all his seizure meds and denies missing doses.  No recent seizures  Past Medical History  Diagnosis Date  . Seizures     since age 26  . Diabetes mellitus   . Blindness of right eye   . Epilepsy     As reported by patient   History reviewed. No pertinent past surgical history. Family History  Problem Relation Age of Onset  . Diabetes Mother   . Hypertension Mother    History  Substance Use Topics  . Smoking status: Current Some Day Smoker    Types: Cigarettes    Last Attempt to Quit: 11/06/2010  . Smokeless tobacco: Never Used  . Alcohol Use: Yes     Comment: occ--four times a week--beer x 2 40 oz    Review of Systems  ROS reviewed and all otherwise negative except for mentioned in HPI    Allergies  Review of patient's allergies indicates no known allergies.  Home Medications   Prior to Admission medications   Medication Sig Start Date End Date Taking? Authorizing Provider  divalproex (DEPAKOTE ER) 500 MG 24 hr tablet Take 4,000 mg by mouth daily.    Yes Historical Provider, MD  levETIRAcetam (KEPPRA) 750 MG tablet Take 1 tablet (750 mg total) by mouth 2 (two) times daily. 01/31/14  Yes Adam Melvern Sample, DO  metFORMIN (GLUCOPHAGE) 500 MG tablet Take 500 mg by mouth daily with breakfast.    Yes Historical Provider, MD  phenytoin  (DILANTIN) 100 MG ER capsule TAKE 2 CAPSULES (200 MG TOTAL) BY MOUTH DAILY. 05/07/14  Yes Adam Melvern Sample, DO  phenytoin (DILANTIN) 50 MG tablet TAKE 1 TABLET BY MOUTH EVERY DAY Patient taking differently: TAKE 1 TABLET BY MOUTH AT BEDTIME 06/04/14  Yes Adam Melvern Sample, DO   BP 127/75 mmHg  Pulse 68  Temp(Src) 98.2 F (36.8 C) (Oral)  Resp 19  SpO2 100%  Vitals reviewed Physical Exam  Physical Examination: General appearance - alert, well appearing, and in no distress Mental status - alert, oriented to person, place, and time Eyes - no conjunctival injection, no scleral icterus, PERRL, no nystagmus Mouth - mucous membranes moist, pharynx normal without lesions Chest - clear to auscultation, no wheezes, rales or rhonchi, symmetric air entry Heart - normal rate, regular rhythm, normal S1, S2, no murmurs, rubs, clicks or gallops Abdomen - soft, nontender, nondistended, no masses or organomegaly Neurological - alert, oriented x 3, normal speech, strength 5/5 in extremities x 4, sensation intact Extremities - peripheral pulses normal, no pedal edema, no clubbing or cyanosis Skin - normal coloration and turgor, no rashes  ED Course  Procedures (including critical care time) Labs Review Labs Reviewed  BASIC METABOLIC PANEL - Abnormal; Notable for the following:    BUN 25 (*)    GFR calc non Af Wyvonnia Lora  89 (*)    All other components within normal limits  PHENYTOIN LEVEL, TOTAL  VALPROIC ACID LEVEL  CBG MONITORING, ED    Imaging Review No results found.   EKG Interpretation   Date/Time:  Monday June 18 2014 11:25:31 EST Ventricular Rate:  58 PR Interval:  147 QRS Duration: 95 QT Interval:  388 QTC Calculation: 381 R Axis:   24 Text Interpretation:  Sinus rhythm No significant change since last  tracing Confirmed by Canary Brim  MD, Reta Norgren [phone]) on 06/18/2014 11:36:17 AM      MDM   Final diagnoses:  Lightheadedness    Pt presenting with c/o feeling lightheaded and  feels he may have a seizure soon.  Labs are reassuring, dilantin level is therapeutic.  Awaiting depakote level, pt signed out to Dr. Wilson Singer to check on this level.  Then anticipate discharge.     Threasa Beards, MD 06/21/14 [phone]

## 2014-06-18 NOTE — ED Notes (Signed)
Provided pt with Sandwich and Juice

## 2014-06-18 NOTE — ED Notes (Signed)
Per lab, should be about 10 minutes on valporoic acid level

## 2014-06-29 ENCOUNTER — Other Ambulatory Visit: Payer: Self-pay | Admitting: Neurology

## 2014-07-23 ENCOUNTER — Other Ambulatory Visit: Payer: Self-pay | Admitting: Neurology

## 2014-08-23 ENCOUNTER — Other Ambulatory Visit: Payer: Self-pay | Admitting: Neurology

## 2014-09-17 ENCOUNTER — Other Ambulatory Visit: Payer: Self-pay | Admitting: Neurology

## 2014-10-10 ENCOUNTER — Other Ambulatory Visit: Payer: Self-pay | Admitting: Neurology

## 2014-10-12 ENCOUNTER — Telehealth: Payer: Self-pay | Admitting: Neurology

## 2014-10-12 NOTE — Telephone Encounter (Signed)
Refills for Dilantin 50 and 100 er called  In to pharmacy

## 2014-10-12 NOTE — Telephone Encounter (Signed)
Pt needs prescription called in for Dilantin/Dawn [phone]

## 2014-10-12 NOTE — Telephone Encounter (Signed)
Please advise  Pt needs prescription called in for Dilantin/    he has not had labs since Feb 2016 last office visit with you was 07/2013

## 2014-10-12 NOTE — Telephone Encounter (Signed)
I did see him in October.  We can refill but I would like to see him as soon as possible for follow up (since I wanted to see him in six months from last visit)

## 2015-01-08 ENCOUNTER — Other Ambulatory Visit: Payer: Self-pay | Admitting: Neurology

## 2015-01-18 ENCOUNTER — Other Ambulatory Visit: Payer: Self-pay | Admitting: Neurology

## 2015-01-18 NOTE — Telephone Encounter (Signed)
[place] with [facility] called in regards to Pt's medication Depakote/Dawn CB# [phone] Ext [phone]

## 2015-01-29 ENCOUNTER — Encounter: Payer: Self-pay | Admitting: Neurology

## 2015-01-29 ENCOUNTER — Ambulatory Visit (INDEPENDENT_AMBULATORY_CARE_PROVIDER_SITE_OTHER): Payer: Medicare Other | Admitting: Neurology

## 2015-01-29 VITALS — BP 100/70 | HR 79 | Ht 65.0 in | Wt 138.4 lb

## 2015-01-29 DIAGNOSIS — G40209 Localization-related (focal) (partial) symptomatic epilepsy and epileptic syndromes with complex partial seizures, not intractable, without status epilepticus: Secondary | ICD-10-CM | POA: Insufficient documentation

## 2015-01-29 HISTORY — DX: Localization-related (focal) (partial) symptomatic epilepsy and epileptic syndromes with complex partial seizures, not intractable, without status epilepticus: G40.209

## 2015-01-29 MED ORDER — DIVALPROEX SODIUM ER 500 MG PO TB24: ORAL_TABLET | ORAL | Status: DC

## 2015-01-29 MED ORDER — LEVETIRACETAM 750 MG PO TABS: 750.0000 mg | ORAL_TABLET | Freq: Two times a day (BID) | ORAL | Status: DC

## 2015-01-29 MED ORDER — PHENYTOIN SODIUM EXTENDED 100 MG PO CAPS: 200.0000 mg | ORAL_CAPSULE | Freq: Every day | ORAL | Status: DC

## 2015-01-29 MED ORDER — PHENYTOIN 50 MG PO CHEW: 50.0000 mg | CHEWABLE_TABLET | Freq: Every day | ORAL | Status: DC

## 2015-01-29 NOTE — Progress Notes (Signed)
NEUROLOGY FOLLOW UP OFFICE NOTE  [name] [id number]  HISTORY OF PRESENT ILLNESS: [name] is a 59 year old right-handed man with diabetes mellitus and right eye blindness who presents for follow-up regarding localization-related epilepsy.  History obtained by patient and ED note from February.  Labs reviewed.    UPDATE: Medications:  Depakote ER 2000mg  twice daily, phenytoin 200mg  in AM and 50mg  at night, Keppra 750mg  twice daily.  He is taking calcium and vitamin D. Last seizure was 01/15/14, but it was only the aura.    Labs from 06/18/14 included  VPA 79.7, phenytoin 12  HISTORY: He began having seizures at age 67.  It may have been precipitated by a head injury after falling out of his crib.    Semiology:  Aura of colored lights in right visual field.  It may last several minutes.  Usually the warning of the aura gives him time to lay down and call the ambulance.  Sometimes it resolves, while other times it progresses to a full-blown seizure, described as loss of consciousness, generalized jerking and tongue biting.  He averages approximately 1-2 seizures per year.  But he says last seizure was around 1 to 1.5 years ago.  Current medication:  Depakote ER 4000mg  daily, Keppra 750mg  BID, Dilantin 250mg  qd.  Also taking D3 2000 IU daily. Prior medication:  phenobarbital.  Higher doses of Dilantin caused ataxia.  09/20/09 MRI Brain w/o:  mild generalized atrophy with prominence to the cerebellum.  Post-operative changes of right globe.  PAST MEDICAL HISTORY: Past Medical History  Diagnosis Date  . Seizures ([place])     since age 59  . Diabetes mellitus   . Blindness of right eye   . Epilepsy ([place])     As reported by patient    MEDICATIONS: Current Outpatient Prescriptions on File Prior to Visit  Medication Sig Dispense Refill  . metFORMIN (GLUCOPHAGE) 500 MG tablet Take 500 mg by mouth daily with breakfast.      No current facility-administered medications on file prior to  visit.    ALLERGIES: No Known Allergies  FAMILY HISTORY: Family History  Problem Relation Age of Onset  . Diabetes Mother   . Hypertension Mother     SOCIAL HISTORY: Social History   Social History  . Marital Status: Single    Spouse Name: N/A  . Number of Children: N/A  . Years of Education: N/A   Occupational History  . Not on file.   Social History Main Topics  . Smoking status: Current Some Day Smoker    Types: Cigarettes    Last Attempt to Quit: 11/06/2010  . Smokeless tobacco: Never Used  . Alcohol Use: Yes     Comment: occ--four times a week--beer x 2 40 oz  . Drug Use: Yes     Comment: cocaine--2 months  . Sexual Activity: Not on file   Other Topics Concern  . Not on file   Social History Narrative    REVIEW OF SYSTEMS: Constitutional: No fevers, chills, or sweats, no generalized fatigue, change in appetite Eyes: No new visual changes, double vision, eye pain Ear, nose and throat: No hearing loss, ear pain, nasal congestion, sore throat Cardiovascular: No chest pain, palpitations Respiratory:  No shortness of breath at rest or with exertion, wheezes GastrointestinaI: No nausea, vomiting, diarrhea, abdominal pain, fecal incontinence Genitourinary:  No dysuria, urinary retention or frequency Musculoskeletal:  No neck pain, back pain Integumentary: No rash, pruritus, skin lesions Neurological: as above  Psychiatric: No depression, insomnia, anxiety Endocrine: No palpitations, fatigue, diaphoresis, mood swings, change in appetite, change in weight, increased thirst Hematologic/Lymphatic:  No anemia, purpura, petechiae. Allergic/Immunologic: no itchy/runny eyes, nasal congestion, recent allergic reactions, rashes  PHYSICAL EXAM: Filed Vitals:   01/29/15 1303  BP: 100/70  Pulse: 79   General: No acute distress.   Head:  Normocephalic/atraumatic Eyes:  Fundoscopic exam unremarkable without vessel changes, exudates, hemorrhages or papilledema. Neck:  supple, no paraspinal tenderness, full range of motion Heart:  Regular rate and rhythm Lungs:  Clear to auscultation bilaterally Back: No paraspinal tenderness Neurological Exam: alert and oriented to person, place, and time. Attention span and concentration intact, recent and remote memory intact, fund of knowledge intact.  Speech fluent and not dysarthric, language intact.  Right eye blindness, fundi not visualized, otherwise CN II-XII intact, bulk and tone normal, muscle strength 5/5 throughout, sensation to light touch, temperature and vibration intact, deep tendon reflexes 2+ throughout, toes downgoing, finger to nose intact, gait normal, Romberg negative.  IMPRESSION: Complex partial seizures with secondary generalization  PLAN: Keppra 750mg  twice daily Dilantin 250mg  daily Depakote ER 2000mg  twice daily Calcium 600mg  twice daily and vitamin D 2000 IU daily Check DEXA scan, CBC, LFTs, TSH, trough VPA level and phenytoin level Follow up in one year.  15 minutes spent face to face with patient, over 50% spent discussing management.  Metta Clines, DO

## 2015-01-29 NOTE — Patient Instructions (Addendum)
1.  Keppra 750mg  twice daily 2.  Dilantin 200mg  in morning and 50mg  at night 3.  Depakote ER 2000mg  twice daily 4.  Calcium 600mg  twice daily and vitamin D 2000 IU daily 5.  Check DEXA scan, CBC, LFTs, TSH, valproic acid level, phenytoin level 6.  Follow up in one year

## 2015-01-30 ENCOUNTER — Other Ambulatory Visit: Payer: Self-pay | Admitting: Neurology

## 2015-01-30 LAB — HEPATIC FUNCTION PANEL
ALK PHOS: 66 U/L (ref 40–115)
ALT: 19 U/L (ref 9–46)
AST: 21 U/L (ref 10–35)
Albumin: 4.2 g/dL (ref 3.6–5.1)
BILIRUBIN INDIRECT: 0.4 mg/dL (ref 0.2–1.2)
Bilirubin, Direct: 0.1 mg/dL (ref <=0.2)
TOTAL PROTEIN: 6.9 g/dL (ref 6.1–8.1)
Total Bilirubin: 0.5 mg/dL (ref 0.2–1.2)

## 2015-01-30 LAB — CBC
HEMATOCRIT: 41.5 % (ref 39.0–52.0)
Hemoglobin: 14.2 g/dL (ref 13.0–17.0)
MCH: 30.3 pg (ref 26.0–34.0)
MCHC: 34.2 g/dL (ref 30.0–36.0)
MCV: 88.7 fL (ref 78.0–100.0)
MPV: 12.7 fL — AB (ref 8.6–12.4)
Platelets: 216 10*3/uL (ref 150–400)
RBC: 4.68 MIL/uL (ref 4.22–5.81)
RDW: 14.3 % (ref 11.5–15.5)
WBC: 6 10*3/uL (ref 4.0–10.5)

## 2015-01-30 LAB — TSH: TSH: 0.776 u[IU]/mL (ref 0.350–4.500)

## 2015-01-31 LAB — VALPROIC ACID LEVEL: VALPROIC ACID LVL: 90 ug/mL (ref 50.0–100.0)

## 2015-01-31 LAB — PHENYTOIN LEVEL, TOTAL: Phenytoin Lvl: 8.9 ug/mL — ABNORMAL LOW (ref 10.0–20.0)

## 2015-02-25 ENCOUNTER — Telehealth: Payer: Self-pay | Admitting: *Deleted

## 2015-02-25 NOTE — Telephone Encounter (Signed)
Called [name] to let him know that I have set up patient's bone density for November 3 at 3:00.  (arrival time @ 2:40)  He said that Thursday was not good so he gave me patient's number to call and have him set up the appointment.  I called patient and gave him the number to call and reschedule.  Also instructed him to stop calcium 48 hours prior to test.

## 2015-02-28 ENCOUNTER — Other Ambulatory Visit: Payer: Medicare Other

## 2015-03-27 ENCOUNTER — Ambulatory Visit
Admission: RE | Admit: 2015-03-27 | Discharge: 2015-03-27 | Disposition: A | Discharge status: Home / Self Care | Payer: Medicare Other | Source: Ambulatory Visit | Attending: Neurology | Admitting: Neurology

## 2015-03-27 DIAGNOSIS — G40209 Localization-related (focal) (partial) symptomatic epilepsy and epileptic syndromes with complex partial seizures, not intractable, without status epilepticus: Secondary | ICD-10-CM

## 2015-05-28 ENCOUNTER — Emergency Department (HOSPITAL_COMMUNITY)
Admission: EM | Admit: 2015-05-28 | Discharge: 2015-05-28 | Disposition: A | Discharge status: Home / Self Care | Payer: Medicare Other | Attending: Emergency Medicine | Admitting: Emergency Medicine

## 2015-05-28 ENCOUNTER — Encounter (HOSPITAL_COMMUNITY): Payer: Self-pay | Admitting: Cardiology

## 2015-05-28 DIAGNOSIS — H5411 Blindness, right eye, low vision left eye: Secondary | ICD-10-CM | POA: Diagnosis not present

## 2015-05-28 DIAGNOSIS — R569 Unspecified convulsions: Secondary | ICD-10-CM | POA: Diagnosis present

## 2015-05-28 DIAGNOSIS — G40909 Epilepsy, unspecified, not intractable, without status epilepticus: Secondary | ICD-10-CM | POA: Insufficient documentation

## 2015-05-28 DIAGNOSIS — E119 Type 2 diabetes mellitus without complications: Secondary | ICD-10-CM | POA: Diagnosis not present

## 2015-05-28 DIAGNOSIS — F17210 Nicotine dependence, cigarettes, uncomplicated: Secondary | ICD-10-CM | POA: Insufficient documentation

## 2015-05-28 DIAGNOSIS — Z794 Long term (current) use of insulin: Secondary | ICD-10-CM | POA: Insufficient documentation

## 2015-05-28 DIAGNOSIS — Z79899 Other long term (current) drug therapy: Secondary | ICD-10-CM | POA: Diagnosis not present

## 2015-05-28 LAB — BASIC METABOLIC PANEL
ANION GAP: 13 (ref 5–15)
BUN: 19 mg/dL (ref 6–20)
CO2: 30 mmol/L (ref 22–32)
Calcium: 9.6 mg/dL (ref 8.9–10.3)
Chloride: 101 mmol/L (ref 101–111)
Creatinine, Ser: 0.92 mg/dL (ref 0.61–1.24)
GFR calc Af Amer: >60 mL/min (ref >60)
Glucose, Bld: 80 mg/dL (ref 65–99)
POTASSIUM: 4.2 mmol/L (ref 3.5–5.1)
SODIUM: 144 mmol/L (ref 135–145)

## 2015-05-28 LAB — VALPROIC ACID LEVEL: Valproic Acid Lvl: 88 ug/mL (ref 50.0–100.0)

## 2015-05-28 LAB — PHENYTOIN LEVEL, TOTAL: PHENYTOIN LVL: 16.2 ug/mL (ref 10.0–20.0)

## 2015-05-28 NOTE — Discharge Instructions (Signed)
As discussed, your evaluation today has been largely reassuring.  But, it is important that you monitor your condition carefully, and do not hesitate to return to the ED if you develop new, or concerning changes in your condition. ? ?Otherwise, please follow-up with your physician for appropriate ongoing care. ? ?

## 2015-05-28 NOTE — ED Provider Notes (Signed)
CSN: [id number]     Arrival date & time 05/28/15  1222 History   First MD Initiated Contact with Patient 05/28/15 1501     Chief Complaint  Patient presents with  . Seizures     (Consider location/radiation/quality/duration/timing/severity/associated sxs/prior Treatment) HPI Patient presents with concern of impending seizure. 3 days ago, patient ran out of seizure medication, substituted is somewhat Dilantin for Keppra. 2 days ago he had a seizure. Yesterday, the patient obtained his medication, and started taking his typical dosing. Today, the patient has persistent sense of impending seizure.  However, the patient states that over the past 2 or 3 hours the symptoms have become less prominent, and are not waxing and waning. Beyond that, he denies changes from baseline condition, including no weakness anywhere, confusion, disorientation, lightheadedness, chest pain, belly pain. Patient denies substantial medical problems beyond seizures.   Past Medical History  Diagnosis Date  . Seizures ([place])     since age 17  . Diabetes mellitus   . Blindness of right eye   . Epilepsy ([place])     As reported by patient   History reviewed. No pertinent past surgical history. Family History  Problem Relation Age of Onset  . Diabetes Mother   . Hypertension Mother    Social History  Substance Use Topics  . Smoking status: Current Some Day Smoker    Types: Cigarettes    Last Attempt to Quit: 11/06/2010  . Smokeless tobacco: Never Used  . Alcohol Use: Yes     Comment: occ--four times a week--beer x 2 40 oz    Review of Systems  Constitutional:       Per HPI, otherwise negative  HENT:       Per HPI, otherwise negative  Eyes:       Blind in right eye  Respiratory:       Per HPI, otherwise negative  Cardiovascular:       Per HPI, otherwise negative  Gastrointestinal: Negative for vomiting.  Endocrine:       Negative aside from HPI  Genitourinary:       Neg aside from HPI    Musculoskeletal:       Per HPI, otherwise negative  Skin: Negative.   Neurological: Positive for seizures. Negative for syncope.      Allergies  Review of patient's allergies indicates no known allergies.  Home Medications   Prior to Admission medications   Medication Sig Start Date End Date Taking? Authorizing Provider  divalproex (DEPAKOTE ER) 500 MG 24 hr tablet Take 4 tablets in morning and 4 tablets at night daily 01/29/15   Pieter Partridge, DO  levETIRAcetam (KEPPRA) 750 MG tablet Take 1 tablet (750 mg total) by mouth 2 (two) times daily. 01/29/15   Pieter Partridge, DO  metFORMIN (GLUCOPHAGE) 500 MG tablet Take 500 mg by mouth daily with breakfast.     Historical Provider, MD  phenytoin (DILANTIN) 100 MG ER capsule Take 2 capsules (200 mg total) by mouth daily. 01/29/15   Pieter Partridge, DO  phenytoin (DILANTIN) 50 MG tablet Chew 1 tablet (50 mg total) by mouth daily. 01/29/15   Pieter Partridge, DO   BP 142/83 mmHg  Pulse 56  Temp(Src) 97.8 F (36.6 C) (Oral)  Resp 14  Ht 5\' 5"  (1.651 m)  Wt 138 lb (62.596 kg)  BMI 22.96 kg/m2  SpO2 98% Physical Exam  Constitutional: He is oriented to person, place, and time. He appears well-developed. No distress.  HENT:  Head: Normocephalic and atraumatic.  Eyes: Conjunctivae and EOM are normal.  Cardiovascular: Normal rate and regular rhythm.   Pulmonary/Chest: Effort normal. No stridor. No respiratory distress.  Abdominal: He exhibits no distension.  Musculoskeletal: He exhibits no edema.  Neurological: He is alert and oriented to person, place, and time. No cranial nerve deficit. He exhibits normal muscle tone. Coordination normal.  No ongoing seizure activity, patient is awake, alert, interacting appropriately, moving all extremity spontaneously, no evidence for distress  Skin: Skin is warm and dry.  Psychiatric: He has a normal mood and affect.  Nursing note and vitals reviewed.   ED Course  Procedures (including critical care  time) Labs Review Labs Reviewed  PHENYTOIN LEVEL, TOTAL  VALPROIC ACID LEVEL  BASIC METABOLIC PANEL   Update: Patient in no distress, aware of all results. With reassuring phenytoin, Depakote level, he will be discharged, follow up with neurology. MDM  Seizure disorder presents after a seizure yesterday, and with ongoing concerns of possible subtherapeutic levels. Here, the patient is awake, alert with no ongoing seizure activity. Vital signs are unremarkable, and for hours of monitoring, the patient has no additional seizures. Given that the patient recently started taking his medication as directed again, yesterday, and has no ongoing seizure activity, is appropriate for discharge with outpatient monitoring.   Carmin Muskrat, MD 05/28/15 [phone]

## 2015-05-28 NOTE — ED Notes (Signed)
Pt concerned that medication levels are low due to possibly having seizure yesterday, pt reports it was not witnessed but he experienced the aura that he normally has before his seizures. Pt is in NAD at this time. Reports he takes dilantin, keppra, and depakote for his seizures.

## 2015-05-28 NOTE — ED Notes (Signed)
Reports he has a hx of seizures and recently had his medication refilled. Concerned that his levels are low and wants them checked.

## 2015-06-05 ENCOUNTER — Encounter: Payer: Self-pay | Admitting: Neurology

## 2015-07-20 ENCOUNTER — Emergency Department (HOSPITAL_COMMUNITY)
Admission: EM | Admit: 2015-07-20 | Discharge: 2015-07-20 | Disposition: A | Discharge status: Home / Self Care | Payer: Medicare Other | Attending: Emergency Medicine | Admitting: Emergency Medicine

## 2015-07-20 ENCOUNTER — Encounter (HOSPITAL_COMMUNITY): Payer: Self-pay | Admitting: Emergency Medicine

## 2015-07-20 DIAGNOSIS — G40909 Epilepsy, unspecified, not intractable, without status epilepticus: Secondary | ICD-10-CM | POA: Diagnosis not present

## 2015-07-20 DIAGNOSIS — H5441 Blindness, right eye, normal vision left eye: Secondary | ICD-10-CM | POA: Insufficient documentation

## 2015-07-20 DIAGNOSIS — F17210 Nicotine dependence, cigarettes, uncomplicated: Secondary | ICD-10-CM | POA: Diagnosis not present

## 2015-07-20 DIAGNOSIS — Z87898 Personal history of other specified conditions: Secondary | ICD-10-CM

## 2015-07-20 DIAGNOSIS — E11649 Type 2 diabetes mellitus with hypoglycemia without coma: Secondary | ICD-10-CM | POA: Insufficient documentation

## 2015-07-20 DIAGNOSIS — Z7984 Long term (current) use of oral hypoglycemic drugs: Secondary | ICD-10-CM | POA: Insufficient documentation

## 2015-07-20 DIAGNOSIS — E162 Hypoglycemia, unspecified: Secondary | ICD-10-CM

## 2015-07-20 DIAGNOSIS — Z79899 Other long term (current) drug therapy: Secondary | ICD-10-CM | POA: Insufficient documentation

## 2015-07-20 LAB — COMPREHENSIVE METABOLIC PANEL
ALK PHOS: 53 U/L (ref 38–126)
ALT: 24 U/L (ref 17–63)
ANION GAP: 14 (ref 5–15)
AST: 26 U/L (ref 15–41)
Albumin: 3.8 g/dL (ref 3.5–5.0)
BUN: 19 mg/dL (ref 6–20)
CALCIUM: 8.8 mg/dL — AB (ref 8.9–10.3)
CO2: 23 mmol/L (ref 22–32)
Chloride: 106 mmol/L (ref 101–111)
Creatinine, Ser: 0.8 mg/dL (ref 0.61–1.24)
Glucose, Bld: 121 mg/dL — ABNORMAL HIGH (ref 65–99)
Potassium: 3.5 mmol/L (ref 3.5–5.1)
SODIUM: 143 mmol/L (ref 135–145)
TOTAL PROTEIN: 6.9 g/dL (ref 6.5–8.1)
Total Bilirubin: 0.5 mg/dL (ref 0.3–1.2)

## 2015-07-20 LAB — CBC
HCT: 39.2 % (ref 39.0–52.0)
HEMOGLOBIN: 14.1 g/dL (ref 13.0–17.0)
MCH: 31.7 pg (ref 26.0–34.0)
MCHC: 36 g/dL (ref 30.0–36.0)
MCV: 88.1 fL (ref 78.0–100.0)
Platelets: 186 10*3/uL (ref 150–400)
RBC: 4.45 MIL/uL (ref 4.22–5.81)
RDW: 14.8 % (ref 11.5–15.5)
WBC: 4.9 10*3/uL (ref 4.0–10.5)

## 2015-07-20 LAB — CBG MONITORING, ED
GLUCOSE-CAPILLARY: 133 mg/dL — AB (ref 65–99)
Glucose-Capillary: 144 mg/dL — ABNORMAL HIGH (ref 65–99)

## 2015-07-20 LAB — VALPROIC ACID LEVEL: VALPROIC ACID LVL: 48 ug/mL — AB (ref 50.0–100.0)

## 2015-07-20 LAB — PHENYTOIN LEVEL, TOTAL: PHENYTOIN LVL: 7.9 ug/mL — AB (ref 10.0–20.0)

## 2015-07-20 MED ORDER — ACETAMINOPHEN 325 MG PO TABS
650.0000 mg | ORAL_TABLET | Freq: Once | ORAL | Status: AC
  Administered 2015-07-20: 650 mg via ORAL
  Filled 2015-07-20: qty 2

## 2015-07-20 MED ORDER — PHENYTOIN SODIUM EXTENDED 100 MG PO CAPS
300.0000 mg | ORAL_CAPSULE | Freq: Once | ORAL | Status: AC
  Administered 2015-07-20: 300 mg via ORAL
  Filled 2015-07-20: qty 3

## 2015-07-20 NOTE — ED Provider Notes (Signed)
CSN: [id number]     Arrival date & time 07/20/15  1250 History   First MD Initiated Contact with Patient 07/20/15 1301     Chief Complaint  Patient presents with  . Seizures  . Hypoglycemia     (Consider location/radiation/quality/duration/timing/severity/associated sxs/prior Treatment) Patient is a 60 y.o. male presenting with seizures and hypoglycemia. The history is provided by the patient and the EMS personnel.  Seizures Hypoglycemia Associated symptoms: seizures   Associated symptoms: no shortness of breath and no vomiting   Patient w hx seizures, presents indicating he feels as though he may have a seizure. States he typically will get a feeling, or aura, before his seizure, states feels similar.  Denies seizure today, or recent/abrupt increase in seizures. States compliant w normal meds.  Patient indicates he had not eaten since last night, and first responder indicated initial blood sugar was 46. Was given po glucose.  Pt denies any recent/acute change in health. No fever or chills. No nvd. No cough or uri c/o.        Past Medical History  Diagnosis Date  . Seizures ([place])     since age 16  . Diabetes mellitus   . Blindness of right eye   . Epilepsy ([place])     As reported by patient   No past surgical history on file. Family History  Problem Relation Age of Onset  . Diabetes Mother   . Hypertension Mother    Social History  Substance Use Topics  . Smoking status: Current Some Day Smoker    Types: Cigarettes    Last Attempt to Quit: 11/06/2010  . Smokeless tobacco: Never Used  . Alcohol Use: Yes     Comment: occ--four times a week--beer x 2 40 oz    Review of Systems  Constitutional: Negative for fever.  HENT: Negative for sore throat.   Eyes: Negative for redness.  Respiratory: Negative for cough and shortness of breath.   Cardiovascular: Negative for chest pain.  Gastrointestinal: Negative for vomiting and abdominal pain.  Genitourinary: Negative for  dysuria and flank pain.  Musculoskeletal: Negative for back pain and neck pain.  Skin: Negative for rash.  Neurological: Positive for seizures. Negative for headaches.  Hematological: Does not bruise/bleed easily.  Psychiatric/Behavioral: Negative for confusion.      Allergies  Review of patient's allergies indicates no known allergies.  Home Medications   Prior to Admission medications   Medication Sig Start Date End Date Taking? Authorizing Provider  divalproex (DEPAKOTE ER) 500 MG 24 hr tablet Take 4 tablets in morning and 4 tablets at night daily 01/29/15   Pieter Partridge, DO  levETIRAcetam (KEPPRA) 750 MG tablet Take 1 tablet (750 mg total) by mouth 2 (two) times daily. 01/29/15   Pieter Partridge, DO  metFORMIN (GLUCOPHAGE) 500 MG tablet Take 500 mg by mouth daily with breakfast.     Historical Provider, MD  phenytoin (DILANTIN) 100 MG ER capsule Take 2 capsules (200 mg total) by mouth daily. 01/29/15   Pieter Partridge, DO  phenytoin (DILANTIN) 50 MG tablet Chew 1 tablet (50 mg total) by mouth daily. Patient taking differently: Chew 50 mg by mouth every evening.  01/29/15   Pieter Partridge, DO   BP 154/95 mmHg  Pulse 84  Temp(Src) 98.8 F (37.1 C) (Oral)  Resp 14  SpO2 96% Physical Exam  Constitutional: He is oriented to person, place, and time. He appears well-developed and well-nourished. No distress.  HENT:  Head: Atraumatic.  Mouth/Throat: Oropharynx is clear and moist.  Eyes: Conjunctivae are normal. Pupils are equal, round, and reactive to light. No scleral icterus.  Neck: Normal range of motion. Neck supple. No tracheal deviation present.  Cardiovascular: Normal rate, regular rhythm, normal heart sounds and intact distal pulses.   Pulmonary/Chest: Effort normal and breath sounds normal. No accessory muscle usage. No respiratory distress.  Abdominal: Soft. Bowel sounds are normal. He exhibits no distension. There is no tenderness.  Musculoskeletal: Normal range of motion. He  exhibits no edema or tenderness.  Neurological: He is alert and oriented to person, place, and time.  Motor intact bil, stre 5/5. sens grossly intact.   Skin: Skin is warm and dry. He is not diaphoretic.  Psychiatric: He has a normal mood and affect.  Nursing note and vitals reviewed.   ED Course  Procedures (including critical care time) Labs Review   Results for orders placed or performed during the hospital encounter of 07/20/15  CBC  Result Value Ref Range   WBC 4.9 4.0 - 10.5 K/uL   RBC 4.45 4.22 - 5.81 MIL/uL   Hemoglobin 14.1 13.0 - 17.0 g/dL   HCT 39.2 39.0 - 52.0 %   MCV 88.1 78.0 - 100.0 fL   MCH 31.7 26.0 - 34.0 pg   MCHC 36.0 30.0 - 36.0 g/dL   RDW 14.8 11.5 - 15.5 %   Platelets 186 150 - 400 K/uL  Comprehensive metabolic panel  Result Value Ref Range   Sodium 143 135 - 145 mmol/L   Potassium 3.5 3.5 - 5.1 mmol/L   Chloride 106 101 - 111 mmol/L   CO2 23 22 - 32 mmol/L   Glucose, Bld 121 (H) 65 - 99 mg/dL   BUN 19 6 - 20 mg/dL   Creatinine, Ser 0.80 0.61 - 1.24 mg/dL   Calcium 8.8 (L) 8.9 - 10.3 mg/dL   Total Protein 6.9 6.5 - 8.1 g/dL   Albumin 3.8 3.5 - 5.0 g/dL   AST 26 15 - 41 U/L   ALT 24 17 - 63 U/L   Alkaline Phosphatase 53 38 - 126 U/L   Total Bilirubin 0.5 0.3 - 1.2 mg/dL   GFR calc non Af Amer >60 >60 mL/min   GFR calc Af Amer >60 >60 mL/min   Anion gap 14 5 - 15  Valproic acid level  Result Value Ref Range   Valproic Acid Lvl 48 (L) 50.0 - 100.0 ug/mL  Phenytoin level, total  Result Value Ref Range   Phenytoin Lvl 7.9 (L) 10.0 - 20.0 ug/mL  CBG monitoring, ED  Result Value Ref Range   Glucose-Capillary 133 (H) 65 - 99 mg/dL     I have personally reviewed and evaluated these lab results as part of my medical decision-making.    MDM   Iv ns. Seizure precautions.  Po fluids, meal.  Reviewed nursing notes and prior charts for additional history.  Reviewed prior neurology office note, ED visit.   Pt indicates has adequate of his  meds, may have missed recent dose(s).  Dil low, hasnt had today, will give dose po.  Recheck pt, has eaten/drank.   No seizure activity in ED.   Blood sugar normal.  Dose of dilantin given in ED.    Pt indicates has adequate meds at home.  Pt currently appears stable for d/c.  Recommend close pcp/neurology follow up.  Return precautions provided.     Lajean Saver, MD 07/20/15 [phone]

## 2015-07-20 NOTE — ED Notes (Signed)
Bed: WA09 Expected date:  Expected time:  Means of arrival:  Comments: EMS- Hypoglycemia

## 2015-07-20 NOTE — ED Notes (Signed)
Pt arrived via EMS with report of pt c/o "feeling like I'm going to have a seizure". Pt denies seizure-like act today. On scene pt's CBG was 46 and given oral glucose and CBG increase to 76. Pt denies any other symptoms.

## 2015-07-20 NOTE — ED Notes (Signed)
Pt has not had any seizure like act since arrival.

## 2015-07-20 NOTE — ED Notes (Signed)
Awake. Verbally responsive. A/O x4. Resp even and unlabored. No audible adventitious breath sounds noted. ABC's intact.  

## 2015-07-20 NOTE — Discharge Instructions (Signed)
It was our pleasure to provide your ER care today - we hope that you feel better.  Take your seizure medications as prescribed - do not skip or miss doses.   Follow up with your doctor/neurologist Monday- call office Monday for appointment.  Also have your blood pressure recheck then as it is mildly high today.   When taking diabetic medication, eat meals regularly, and do not skip or delay meals.  Hold your diabetes medication until follow up with your doctor Monday.   From today's labs, your dilantin level (7.9) and depakote level (48) are low - take your medications as prescribed, and follow up with your doctor in coming week.  No driving until clear to do so by your doctor.  Return to ER if worse, new symptoms, fevers, seizures, weak/fainting, medical emergency, other concern.    Seizure, Adult A seizure is abnormal electrical activity in the brain. Seizures usually last from 30 seconds to 2 minutes. There are various types of seizures. Before a seizure, you may have a warning sensation (aura) that a seizure is about to occur. An aura may include the following symptoms:   Fear or anxiety.  Nausea.  Feeling like the room is spinning (vertigo).  Vision changes, such as seeing flashing lights or spots. Common symptoms during a seizure include:  A change in attention or behavior (altered mental status).  Convulsions with rhythmic jerking movements.  Drooling.  Rapid eye movements.  Grunting.  Loss of bladder and bowel control.  Bitter taste in the mouth.  Tongue biting. After a seizure, you may feel confused and sleepy. You may also have an injury resulting from convulsions during the seizure. HOME CARE INSTRUCTIONS   If you are given medicines, take them exactly as prescribed by your health care provider.  Keep all follow-up appointments as directed by your health care provider.  Do not swim or drive or engage in risky activity during which a seizure could cause  further injury to you or others until your health care provider says it is OK.  Get adequate rest.  Teach friends and family what to do if you have a seizure. They should:  Lay you on the ground to prevent a fall.  Put a cushion under your head.  Loosen any tight clothing around your neck.  Turn you on your side. If vomiting occurs, this helps keep your airway clear.  Stay with you until you recover.  Know whether or not you need emergency care. SEEK IMMEDIATE MEDICAL CARE IF:  The seizure lasts longer than 5 minutes.  The seizure is severe or you do not wake up immediately after the seizure.  You have an altered mental status after the seizure.  You are having more frequent or worsening seizures. Someone should drive you to the emergency department or call local emergency services (911 in [place] MAKE SURE YOU:  Understand these instructions.  Will watch your condition.  Will get help right away if you are not doing well or get worse.   This information is not intended to replace advice given to you by your health care provider. Make sure you discuss any questions you have with your health care provider.   Document Released: 04/10/2000 Document Revised: 05/04/2014 Document Reviewed: 11/23/2012 Elsevier Interactive Patient Education 2016 [employer].  Hypoglycemia Hypoglycemia occurs when the glucose in your blood is too low. Glucose is a type of sugar that is your body's main energy source. Hormones, such as insulin and glucagon, control the  level of glucose in the blood. Insulin lowers blood glucose and glucagon increases blood glucose. Having too much insulin in your blood stream, or not eating enough food containing sugar, can result in hypoglycemia. Hypoglycemia can happen to people with or without diabetes. It can develop quickly and can be a medical emergency.  CAUSES   Missing or delaying meals.  Not eating enough carbohydrates at meals.  Taking too much  diabetes medicine.  Not timing your oral diabetes medicine or insulin doses with meals, snacks, and exercise.  Nausea and vomiting.  Certain medicines.  Severe illnesses, such as hepatitis, kidney disorders, and certain eating disorders.  Increased activity or exercise without eating something extra or adjusting medicines.  Drinking too much alcohol.  A nerve disorder that affects body functions like your heart rate, blood pressure, and digestion (autonomic neuropathy).  A condition where the stomach muscles do not function properly (gastroparesis). Therefore, medicines and food may not absorb properly.  Rarely, a tumor of the pancreas can produce too much insulin. SYMPTOMS   Hunger.  Sweating (diaphoresis).  Change in body temperature.  Shakiness.  Headache.  Anxiety.  Lightheadedness.  Irritability.  Difficulty concentrating.  Dry mouth.  Tingling or numbness in the hands or feet.  Restless sleep or sleep disturbances.  Altered speech and coordination.  Change in mental status.  Seizures or prolonged convulsions.  Combativeness.  Drowsiness (lethargic).  Weakness.  Increased heart rate or palpitations.  Confusion.  Pale, gray skin color.  Blurred or double vision.  Fainting. DIAGNOSIS  A physical exam and medical history will be performed. Your caregiver may make a diagnosis based on your symptoms. Blood tests and other lab tests may be performed to confirm a diagnosis. Once the diagnosis is made, your caregiver will see if your signs and symptoms go away once your blood glucose is raised.  TREATMENT  Usually, you can easily treat your hypoglycemia when you notice symptoms.  Check your blood glucose. If it is less than 70 mg/dl, take one of the following:   3-4 glucose tablets.    cup juice.    cup regular soda.   1 cup skim milk.   -1 tube of glucose gel.   5-6 hard candies.   Avoid high-fat drinks or food that may  delay a rise in blood glucose levels.  Do not take more than the recommended amount of sugary foods, drinks, gel, or tablets. Doing so will cause your blood glucose to go too high.   Wait 10-15 minutes and recheck your blood glucose. If it is still less than 70 mg/dl or below your target range, repeat treatment.   Eat a snack if it is more than 1 hour until your next meal.  There may be a time when your blood glucose may go so low that you are unable to treat yourself at home when you start to notice symptoms. You may need someone to help you. You may even faint or be unable to swallow. If you cannot treat yourself, someone will need to bring you to the hospital.  [place]  If you have diabetes, follow your diabetes management plan by:  Taking your medicines as directed.  Following your exercise plan.  Following your meal plan. Do not skip meals. Eat on time.  Testing your blood glucose regularly. Check your blood glucose before and after exercise. If you exercise longer or different than usual, be sure to check blood glucose more frequently.  Wearing your medical alert jewelry  that says you have diabetes.  Identify the cause of your hypoglycemia. Then, develop ways to prevent the recurrence of hypoglycemia.  Do not take a hot bath or shower right after an insulin shot.  Always carry treatment with you. Glucose tablets are the easiest to carry.  If you are going to drink alcohol, drink it only with meals.  Tell friends or family members ways to keep you safe during a seizure. This may include removing hard or sharp objects from the area or turning you on your side.  Maintain a healthy weight. SEEK MEDICAL CARE IF:   You are having problems keeping your blood glucose in your target range.  You are having frequent episodes of hypoglycemia.  You feel you might be having side effects from your medicines.  You are not sure why your blood glucose is dropping so  low.  You notice a change in vision or a new problem with your vision. SEEK IMMEDIATE MEDICAL CARE IF:   Confusion develops.  A change in mental status occurs.  The inability to swallow develops.  Fainting occurs.   This information is not intended to replace advice given to you by your health care provider. Make sure you discuss any questions you have with your health care provider.   Document Released: 04/13/2005 Document Revised: 04/18/2013 Document Reviewed: 12/18/2014 Elsevier Interactive Patient Education [employer].

## 2015-08-04 ENCOUNTER — Encounter (HOSPITAL_COMMUNITY): Payer: Self-pay | Admitting: Emergency Medicine

## 2015-08-04 ENCOUNTER — Emergency Department (HOSPITAL_COMMUNITY): Payer: Medicare Other

## 2015-08-04 ENCOUNTER — Emergency Department (HOSPITAL_COMMUNITY)
Admission: EM | Admit: 2015-08-04 | Discharge: 2015-08-04 | Disposition: A | Discharge status: Home / Self Care | Payer: Medicare Other | Attending: Emergency Medicine | Admitting: Emergency Medicine

## 2015-08-04 DIAGNOSIS — H5441 Blindness, right eye, normal vision left eye: Secondary | ICD-10-CM | POA: Insufficient documentation

## 2015-08-04 DIAGNOSIS — G40909 Epilepsy, unspecified, not intractable, without status epilepticus: Secondary | ICD-10-CM | POA: Insufficient documentation

## 2015-08-04 DIAGNOSIS — R39198 Other difficulties with micturition: Secondary | ICD-10-CM | POA: Insufficient documentation

## 2015-08-04 DIAGNOSIS — R300 Dysuria: Secondary | ICD-10-CM | POA: Diagnosis not present

## 2015-08-04 DIAGNOSIS — Z7984 Long term (current) use of oral hypoglycemic drugs: Secondary | ICD-10-CM | POA: Diagnosis not present

## 2015-08-04 DIAGNOSIS — Z79899 Other long term (current) drug therapy: Secondary | ICD-10-CM | POA: Diagnosis not present

## 2015-08-04 DIAGNOSIS — E119 Type 2 diabetes mellitus without complications: Secondary | ICD-10-CM | POA: Insufficient documentation

## 2015-08-04 DIAGNOSIS — F17210 Nicotine dependence, cigarettes, uncomplicated: Secondary | ICD-10-CM | POA: Diagnosis not present

## 2015-08-04 DIAGNOSIS — K5900 Constipation, unspecified: Secondary | ICD-10-CM | POA: Diagnosis not present

## 2015-08-04 LAB — URINALYSIS, ROUTINE W REFLEX MICROSCOPIC
BILIRUBIN URINE: NEGATIVE
Glucose, UA: NEGATIVE mg/dL
HGB URINE DIPSTICK: NEGATIVE
KETONES UR: 15 mg/dL — AB
Leukocytes, UA: NEGATIVE
NITRITE: NEGATIVE
PH: 5.5 (ref 5.0–8.0)
Protein, ur: NEGATIVE mg/dL
Specific Gravity, Urine: 1.038 — ABNORMAL HIGH (ref 1.005–1.030)

## 2015-08-04 MED ORDER — MAGNESIUM CITRATE PO SOLN
1.0000 | Freq: Once | ORAL | Status: AC
  Administered 2015-08-04: 1 via ORAL
  Filled 2015-08-04: qty 296

## 2015-08-04 NOTE — ED Notes (Signed)
Bladder scan completed, 39 mL.

## 2015-08-04 NOTE — Discharge Instructions (Signed)

## 2015-08-04 NOTE — ED Provider Notes (Signed)
CSN: [id number]     Arrival date & time 08/04/15  1036 History   First MD Initiated Contact with Patient 08/04/15 1101     Chief Complaint  Patient presents with  . Constipation     (Consider location/radiation/quality/duration/timing/severity/associated sxs/prior Treatment) HPI Comments: Patient presents with constipation. He's a 60 year old male with a history of seizures and diabetes. He states over the last 2 days he's had difficulty with a bowel movement. He did have a normal bowel movement 2 days ago. He states he feels pressured to have a bowel movement but hasn't been able to have one. He has had 2 small watery bowel movements but doesn't feel like he's gotten relief from this. He denies abdominal pain although refill some distention in his abdomen. He states he's had difficulty urinating over the last 2 days as well. He is only able to urinate small amounts at a time. He denies any pain or burning on urination. There is no nausea or vomiting. No fevers. He took a laxative on Friday which is how he had a bowel movement at that time.  Patient is a 60 y.o. male presenting with constipation.  Constipation Associated symptoms: dysuria   Associated symptoms: no abdominal pain, no back pain, no diarrhea, no fever, no nausea and no vomiting     Past Medical History  Diagnosis Date  . Seizures ([place])     since age 68  . Diabetes mellitus   . Blindness of right eye   . Epilepsy ([place])     As reported by patient   History reviewed. No pertinent past surgical history. Family History  Problem Relation Age of Onset  . Diabetes Mother   . Hypertension Mother    Social History  Substance Use Topics  . Smoking status: Current Every Day Smoker -- 0.25 packs/day    Types: Cigarettes  . Smokeless tobacco: Never Used  . Alcohol Use: Yes     Comment: one 40 oz beer each day    Review of Systems  Constitutional: Negative for fever, chills, diaphoresis and fatigue.  HENT: Negative for  congestion, rhinorrhea and sneezing.   Eyes: Negative.   Respiratory: Negative for cough, chest tightness and shortness of breath.   Cardiovascular: Negative for chest pain and leg swelling.  Gastrointestinal: Positive for constipation and abdominal distention. Negative for nausea, vomiting, abdominal pain, diarrhea and blood in stool.  Genitourinary: Positive for dysuria and difficulty urinating. Negative for frequency, hematuria and flank pain.  Musculoskeletal: Negative for back pain and arthralgias.  Skin: Negative for rash.  Neurological: Negative for dizziness, speech difficulty, weakness, numbness and headaches.      Allergies  Review of patient's allergies indicates no known allergies.  Home Medications   Prior to Admission medications   Medication Sig Start Date End Date Taking? Authorizing Provider  divalproex (DEPAKOTE ER) 500 MG 24 hr tablet Take 4 tablets in morning and 4 tablets at night daily 01/29/15   Pieter Partridge, DO  levETIRAcetam (KEPPRA) 750 MG tablet Take 1 tablet (750 mg total) by mouth 2 (two) times daily. 01/29/15   Pieter Partridge, DO  metFORMIN (GLUCOPHAGE) 500 MG tablet Take 500 mg by mouth daily with breakfast.     Historical Provider, MD  phenytoin (DILANTIN) 100 MG ER capsule Take 2 capsules (200 mg total) by mouth daily. 01/29/15   Pieter Partridge, DO  phenytoin (DILANTIN) 50 MG tablet Chew 1 tablet (50 mg total) by mouth daily. Patient taking differently: Chew 50 mg  by mouth every evening.  01/29/15   Pieter Partridge, DO   BP 123/83 mmHg  Pulse 64  Temp(Src) 97.8 F (36.6 C) (Oral)  Resp 14  SpO2 99% Physical Exam  Constitutional: He is oriented to person, place, and time. He appears well-developed and well-nourished.  HENT:  Head: Normocephalic and atraumatic.  Eyes: Pupils are equal, round, and reactive to light.  Neck: Normal range of motion. Neck supple.  Cardiovascular: Normal rate, regular rhythm and normal heart sounds.   Pulmonary/Chest: Effort  normal and breath sounds normal. No respiratory distress. He has no wheezes. He has no rales. He exhibits no tenderness.  Abdominal: Soft. Bowel sounds are normal. There is no tenderness. There is no rebound and no guarding.  Genitourinary:  Normal circumcised male genitalia. No discharge or testicular tenderness. Rectal exam is unremarkable with no evidence of impaction.  Musculoskeletal: Normal range of motion. He exhibits no edema.  Lymphadenopathy:    He has no cervical adenopathy.  Neurological: He is alert and oriented to person, place, and time.  Skin: Skin is warm and dry. No rash noted.  Psychiatric: He has a normal mood and affect.    ED Course  Procedures (including critical care time) Labs Review Labs Reviewed  URINALYSIS, ROUTINE W REFLEX MICROSCOPIC (NOT AT [facility]) - Abnormal; Notable for the following:    Specific Gravity, Urine 1.038 (*)    Ketones, ur 15 (*)    All other components within normal limits    Imaging Review Dg Abd 1 View  08/04/2015  CLINICAL DATA:  Low abdominal pain X 3 days, patient has not had a normal bowel movement X 3 days. abdominal distention EXAM: ABDOMEN - 1 VIEW COMPARISON:  None. FINDINGS: Normal bowel gas pattern.  Mild overall colonic stool burden. No evidence of renal or ureteral stones. Soft tissues are unremarkable. IMPRESSION: 1. No acute findings.  No evidence bowel obstruction. 2. Mild colonic stool burden.  No significant constipation. Electronically Signed   By: Lajean Manes M.D.   On: 08/04/2015 12:00   I have personally reviewed and evaluated these images and lab results as part of my medical decision-making.   EKG Interpretation None      MDM   Final diagnoses:  Constipation, unspecified constipation type   PT presents with constipation.  No evidence of obstruction.  No UTI, no fecal impaction.  No urinary retention.  No abd pain on exam.  Given mag citrate to use at home.  Pt has f/u appt with his PMD  tomorrow.     Malvin Johns, MD 08/04/15 [phone]

## 2015-08-04 NOTE — ED Notes (Signed)
Patient complains of constipation, states his last normal bowel movement was Friday.  States that on Friday he took a laxative prior to have a bowel movement.  Patient ambulatory, alert, oriented, and in no apparent distress at this time.

## 2015-12-22 ENCOUNTER — Emergency Department (HOSPITAL_COMMUNITY)
Admission: EM | Admit: 2015-12-22 | Discharge: 2015-12-22 | Disposition: A | Discharge status: Home / Self Care | Payer: Medicare Other | Source: Home / Self Care | Attending: Emergency Medicine | Admitting: Emergency Medicine

## 2015-12-22 ENCOUNTER — Emergency Department (HOSPITAL_COMMUNITY)
Admission: EM | Admit: 2015-12-22 | Discharge: 2015-12-22 | Disposition: A | Discharge status: Home / Self Care | Payer: Medicare Other | Attending: Emergency Medicine | Admitting: Emergency Medicine

## 2015-12-22 ENCOUNTER — Encounter (HOSPITAL_COMMUNITY): Payer: Self-pay | Admitting: Emergency Medicine

## 2015-12-22 ENCOUNTER — Encounter (HOSPITAL_COMMUNITY): Payer: Self-pay | Admitting: *Deleted

## 2015-12-22 DIAGNOSIS — E119 Type 2 diabetes mellitus without complications: Secondary | ICD-10-CM

## 2015-12-22 DIAGNOSIS — F17210 Nicotine dependence, cigarettes, uncomplicated: Secondary | ICD-10-CM | POA: Insufficient documentation

## 2015-12-22 DIAGNOSIS — Z79899 Other long term (current) drug therapy: Secondary | ICD-10-CM | POA: Diagnosis not present

## 2015-12-22 DIAGNOSIS — G40909 Epilepsy, unspecified, not intractable, without status epilepticus: Secondary | ICD-10-CM | POA: Insufficient documentation

## 2015-12-22 DIAGNOSIS — R791 Abnormal coagulation profile: Secondary | ICD-10-CM | POA: Diagnosis not present

## 2015-12-22 DIAGNOSIS — R7889 Finding of other specified substances, not normally found in blood: Secondary | ICD-10-CM

## 2015-12-22 LAB — BASIC METABOLIC PANEL WITH GFR
Anion gap: 7 (ref 5–15)
BUN: 22 mg/dL — ABNORMAL HIGH (ref 6–20)
CO2: 26 mmol/L (ref 22–32)
Calcium: 8.9 mg/dL (ref 8.9–10.3)
Chloride: 108 mmol/L (ref 101–111)
Creatinine, Ser: 0.79 mg/dL (ref 0.61–1.24)
GFR calc Af Amer: >60 mL/min
GFR calc non Af Amer: >60 mL/min
Glucose, Bld: 78 mg/dL (ref 65–99)
Potassium: 3.7 mmol/L (ref 3.5–5.1)
Sodium: 141 mmol/L (ref 135–145)

## 2015-12-22 LAB — I-STAT CHEM 8, ED
BUN: 22 mg/dL — ABNORMAL HIGH (ref 6–20)
CHLORIDE: 104 mmol/L (ref 101–111)
CREATININE: 0.8 mg/dL (ref 0.61–1.24)
Calcium, Ion: 1.1 mmol/L — ABNORMAL LOW (ref 1.12–1.23)
GLUCOSE: 85 mg/dL (ref 65–99)
HEMATOCRIT: 43 % (ref 39.0–52.0)
HEMOGLOBIN: 14.6 g/dL (ref 13.0–17.0)
POTASSIUM: 3.6 mmol/L (ref 3.5–5.1)
Sodium: 142 mmol/L (ref 135–145)
TCO2: 27 mmol/L (ref 0–100)

## 2015-12-22 LAB — PHENYTOIN LEVEL, TOTAL: Phenytoin Lvl: 9.3 ug/mL — ABNORMAL LOW (ref 10.0–20.0)

## 2015-12-22 LAB — VALPROIC ACID LEVEL: Valproic Acid Lvl: 67 ug/mL (ref 50.0–100.0)

## 2015-12-22 MED ORDER — ACETAMINOPHEN 325 MG PO TABS
650.0000 mg | ORAL_TABLET | Freq: Once | ORAL | Status: AC
  Administered 2015-12-22: 650 mg via ORAL
  Filled 2015-12-22: qty 2

## 2015-12-22 MED ORDER — SODIUM CHLORIDE 0.9 % IV BOLUS (SEPSIS)
1000.0000 mL | Freq: Once | INTRAVENOUS | Status: AC
  Administered 2015-12-22: 1000 mL via INTRAVENOUS

## 2015-12-22 MED ORDER — SODIUM CHLORIDE 0.9 % IV SOLN
500.0000 mg | Freq: Once | INTRAVENOUS | Status: AC
  Administered 2015-12-22: 500 mg via INTRAVENOUS
  Filled 2015-12-22: qty 10

## 2015-12-22 MED ORDER — KETOROLAC TROMETHAMINE 60 MG/2ML IM SOLN
60.0000 mg | Freq: Once | INTRAMUSCULAR | Status: AC
  Administered 2015-12-22: 60 mg via INTRAMUSCULAR
  Filled 2015-12-22: qty 2

## 2015-12-22 MED ORDER — PHENYTOIN SODIUM EXTENDED 100 MG PO CAPS
200.0000 mg | ORAL_CAPSULE | Freq: Once | ORAL | Status: AC
  Administered 2015-12-22: 200 mg via ORAL
  Filled 2015-12-22: qty 2

## 2015-12-22 NOTE — ED Notes (Signed)
Call patient's aide, Lesly Rubenstein, at [phone] to pickup patient after discharge.

## 2015-12-22 NOTE — ED Notes (Signed)
Pt continues to complain of severe L sided temporal headache. Nehemiah Settle, EDP, made aware

## 2015-12-22 NOTE — Discharge Instructions (Signed)
Call Dr. Tomi Likens tomorrow for follow-up. Your seizure medications may need to be adjusted. Avoid cocaine as cocaine can cause seizures. Call any of the numbers to get help with your drug problem

## 2015-12-22 NOTE — ED Provider Notes (Signed)
[facility] DEPT Provider Note   CSN: [id number] Arrival date & time: 12/22/15  [id number]     History   Chief Complaint Chief Complaint  Patient presents with  . Near Syncope    HPI [name] is a 60 y.o. male.  HPI Patient reports he "fell out" this morning meaning he thinks he has seizure. Patient was seen here earlier today with complaint that he felt as if he were going to have a seizure. He was treated with Dilantin ER 200 mg orally. Patient is presently asymptomatic except feels drowsy. No other associated symptoms. Nothing makes symptoms better or worse. Past Medical History:  Diagnosis Date  . Blindness of right eye   . Diabetes mellitus   . Epilepsy ([place])    As reported by patient  . Seizures [facility])    since age 66    Patient Active Problem List   Diagnosis Date Noted  . Localization-related (focal) (partial) symptomatic epilepsy and epileptic syndromes with complex partial seizures, not intractable, without status epilepticus ([place]) 01/29/2015  . Blindness of right eye   . LUNG ABSCESS 03/14/2008    History reviewed. No pertinent surgical history.     Home Medications    Prior to Admission medications   Medication Sig Start Date End Date Taking? Authorizing Provider  divalproex (DEPAKOTE ER) 500 MG 24 hr tablet Take 4 tablets in morning and 4 tablets at night daily 01/29/15  Yes Pieter Partridge, DO  levETIRAcetam (KEPPRA) 750 MG tablet Take 1 tablet (750 mg total) by mouth 2 (two) times daily. 01/29/15  Yes Adam Telford Nab, DO  phenytoin (DILANTIN) 100 MG ER capsule Take 2 capsules (200 mg total) by mouth daily. 01/29/15  Yes Adam Telford Nab, DO  phenytoin (DILANTIN) 50 MG tablet Chew 1 tablet (50 mg total) by mouth daily. Patient taking differently: Chew 50 mg by mouth every evening.  01/29/15  Yes Pieter Partridge, DO    Family History Family History  Problem Relation Age of Onset  . Diabetes Mother   . Hypertension Mother     Social History Social History    Substance Use Topics  . Smoking status: Current Every Day Smoker    Packs/day: 0.25    Types: Cigarettes  . Smokeless tobacco: Never Used  . Alcohol use Yes     Comment: one 40 oz beer each day  Admits to crack cocaine use last time used 4 days ago. Denies IV drug use   Allergies   Review of patient's allergies indicates no known allergies.   Review of Systems Review of Systems  HENT: Negative.   Eyes: Positive for visual disturbance.       Blind right eye  Respiratory: Negative.   Cardiovascular: Negative.        Syncope  Gastrointestinal: Negative.   Musculoskeletal: Negative.   Skin: Negative.   Allergic/Immunologic: Negative.   Neurological: Positive for weakness.       Generalized weakness  Psychiatric/Behavioral: Negative.   All other systems reviewed and are negative.    Physical Exam Updated Vital Signs BP 125/79   Pulse 69   Temp 98.4 F (36.9 C) (Oral)   Resp 15   SpO2 99%   Physical Exam  Constitutional: He is oriented to person, place, and time. He appears well-developed and well-nourished.  HENT:  Head: Normocephalic and atraumatic.  Eyes: Conjunctivae and EOM are normal.  Neck: Neck supple. No tracheal deviation present. No thyromegaly present.  Cardiovascular: Normal rate and regular  rhythm.   No murmur heard. Pulmonary/Chest: Effort normal and breath sounds normal.  Abdominal: Soft. Bowel sounds are normal. He exhibits no distension. There is no tenderness.  Musculoskeletal: Normal range of motion. He exhibits no edema or tenderness.  Neurological: He is alert and oriented to person, place, and time. He displays normal reflexes. No cranial nerve deficit. Coordination normal.  Motor strength 5 over 5 overall  Skin: Skin is warm and dry. No rash noted.  Psychiatric: He has a normal mood and affect.  Nursing note and vitals reviewed.    ED Treatments / Results  Labs (all labs ordered are listed, but only abnormal results are  displayed) Labs Reviewed  I-STAT CHEM 8, ED - Abnormal; Notable for the following:       Result Value   BUN 22 (*)    Calcium, Ion 1.10 (*)    All other components within normal limits    EKG  EKG Interpretation  Date/Time:  Sunday December 22 2015 07:07:56 EDT Ventricular Rate:  68 PR Interval:    QRS Duration: 93 QT Interval:  392 QTC Calculation: 417 R Axis:   -11 Text Interpretation:  Sinus rhythm No significant change since last tracing Confirmed by Winfred Leeds  MD, Glorimar Stroope [id number]) on 12/22/2015 7:38:19 AM       Radiology No results found.  Procedures Procedures (including critical care time)  Medications Ordered in ED Medications  phenytoin (DILANTIN) 500 mg in sodium chloride 0.9 % 100 mL IVPB (0 mg Intravenous Stopped 12/22/15 0843)  sodium chloride 0.9 % bolus 1,000 mL (0 mLs Intravenous Stopped 12/22/15 0840)     Initial Impression / Assessment and Plan / ED Course  I have reviewed the triage vital signs and the nursing notes.  Pertinent labs & imaging results that were available during my care of the patient were reviewed by me and considered in my medical decision making (see chart for details).  Clinical Course  Patient loaded intravenously with 500 mg Dilantin. Results for orders placed or performed during the hospital encounter of 12/22/15  I-stat chem 8, ed  Result Value Ref Range   Sodium 142 135 - 145 mmol/L   Potassium 3.6 3.5 - 5.1 mmol/L   Chloride 104 101 - 111 mmol/L   BUN 22 (H) 6 - 20 mg/dL   Creatinine, Ser 0.80 0.61 - 1.24 mg/dL   Glucose, Bld 85 65 - 99 mg/dL   Calcium, Ion 1.10 (L) 1.12 - 1.23 mmol/L   TCO2 27 0 - 100 mmol/L   Hemoglobin 14.6 13.0 - 17.0 g/dL   HCT 43.0 39.0 - 52.0 %   No results found. 11:30 AM gait slightly unsteady. At 12:45 PM gait is normal he feels that he on his feet. I counseled patient regarding smoking cessation and use of cocaine can cause seizures. He is instructed to call his neurologist tomorrow for  follow-up and possible adjustment of medications  Final Clinical Impressions(s) / ED Diagnoses  Diagnoses #1 seizure disorder #2 substance abuse Final diagnoses:  None    New Prescriptions New Prescriptions   No medications on file     Orlie Dakin, MD 12/22/15 1250

## 2015-12-22 NOTE — ED Provider Notes (Signed)
[facility] DEPT Provider Note   CSN: [id number] Arrival date & time: 12/22/15  0015     History   Chief Complaint Chief Complaint  Patient presents with  . Seizures    HPI [name] is a 60 y.o. male.  The patient presents with symptoms of feeling off balance and like he is going to have a seizure. He states he has been compliant with his seizure medications, but also states "my levels might be low 'cause they haven't been checked in a while". No fever, recent illness, pain, vomiting, lateralizing weakness.    The history is provided by the patient. No language interpreter was used.  Seizures      Past Medical History:  Diagnosis Date  . Blindness of right eye   . Diabetes mellitus   . Epilepsy ([place])    As reported by patient  . Seizures [facility])    since age 45    Patient Active Problem List   Diagnosis Date Noted  . Localization-related (focal) (partial) symptomatic epilepsy and epileptic syndromes with complex partial seizures, not intractable, without status epilepticus ([place]) 01/29/2015  . Blindness of right eye   . LUNG ABSCESS 03/14/2008    History reviewed. No pertinent surgical history.     Home Medications    Prior to Admission medications   Medication Sig Start Date End Date Taking? Authorizing Provider  divalproex (DEPAKOTE ER) 500 MG 24 hr tablet Take 4 tablets in morning and 4 tablets at night daily 01/29/15  Yes Pieter Partridge, DO  levETIRAcetam (KEPPRA) 750 MG tablet Take 1 tablet (750 mg total) by mouth 2 (two) times daily. 01/29/15  Yes Adam Telford Nab, DO  phenytoin (DILANTIN) 100 MG ER capsule Take 2 capsules (200 mg total) by mouth daily. 01/29/15  Yes Adam Telford Nab, DO  phenytoin (DILANTIN) 50 MG tablet Chew 1 tablet (50 mg total) by mouth daily. Patient taking differently: Chew 50 mg by mouth every evening.  01/29/15  Yes Pieter Partridge, DO    Family History Family History  Problem Relation Age of Onset  . Diabetes Mother   . Hypertension  Mother     Social History Social History  Substance Use Topics  . Smoking status: Current Every Day Smoker    Packs/day: 0.25    Types: Cigarettes  . Smokeless tobacco: Never Used  . Alcohol use Yes     Comment: one 40 oz beer each day     Allergies   Review of patient's allergies indicates no known allergies.   Review of Systems Review of Systems  Constitutional: Negative for chills and fever.  HENT: Negative.   Respiratory: Negative.   Cardiovascular: Negative.   Gastrointestinal: Negative.   Musculoskeletal: Negative.   Skin: Negative.   Neurological: Positive for seizures and weakness.     Physical Exam Updated Vital Signs BP 159/96   Pulse (!) 59   Temp 98.6 F (37 C) (Oral)   Resp 15   Ht 5\' 5"  (1.651 m)   Wt 59 kg   SpO2 98%   BMI 21.63 kg/m   Physical Exam  Constitutional: He appears well-developed and well-nourished.  HENT:  Head: Normocephalic.  Neck: Normal range of motion. Neck supple.  Cardiovascular: Normal rate and regular rhythm.   Pulmonary/Chest: Effort normal and breath sounds normal.  Abdominal: Soft. Bowel sounds are normal. There is no tenderness. There is no rebound and no guarding.  Musculoskeletal: Normal range of motion.  Neurological: He is alert. No  cranial nerve deficit.  Skin: Skin is warm and dry. No rash noted.  Psychiatric: He has a normal mood and affect.     ED Treatments / Results  Labs (all labs ordered are listed, but only abnormal results are displayed) Labs Reviewed  PHENYTOIN LEVEL, TOTAL - Abnormal; Notable for the following:       Result Value   Phenytoin Lvl 9.3 (*)    All other components within normal limits  BASIC METABOLIC PANEL - Abnormal; Notable for the following:    BUN 22 (*)    All other components within normal limits  VALPROIC ACID LEVEL    EKG  EKG Interpretation  Date/Time:  Sunday December 22 2015 00:22:34 EDT Ventricular Rate:  65 PR Interval:    QRS Duration: 92 QT  Interval:  382 QTC Calculation: 398 R Axis:   9 Text Interpretation:  Sinus rhythm Baseline wander in lead(s) V2 No significant change since last tracing Confirmed by FLOYD MD, DANIEL [id number]) on 12/22/2015 12:44:25 AM       Radiology No results found.  Procedures Procedures (including critical care time)  Medications Ordered in ED Medications  acetaminophen (TYLENOL) tablet 650 mg (650 mg Oral Given 12/22/15 0156)  phenytoin (DILANTIN) ER capsule 200 mg (200 mg Oral Given 12/22/15 0241)     Initial Impression / Assessment and Plan / ED Course  I have reviewed the triage vital signs and the nursing notes.  Pertinent labs & imaging results that were available during my care of the patient were reviewed by me and considered in my medical decision making (see chart for details).  Clinical Course    Patient presents with the sense that he is going to have a seizure. He is observed in the ED without seizure activity. Dilantin level is slightly low - 200 mg ER Dilantin (single dose) provided in the ED. He is drinking. VSS. He can be discharged home and encouraged to follow up with PCP.  Final Clinical Impressions(s) / ED Diagnoses   Final diagnoses:  None   1. Subtherapeutic dilantin level  New Prescriptions New Prescriptions   No medications on file     Charlann Lange, PA-C 12/22/15 [place], DO 12/22/15 [place], DO 12/22/15 [phone]

## 2015-12-22 NOTE — ED Triage Notes (Signed)
Pt to ED by EMS reporting he feels like he may have a seizure. Pt has had dizziness and nausea throughout the day. Reports being complaint with seizure medications. Pt has a staggering gait when ambulating to stretcher

## 2015-12-22 NOTE — ED Notes (Signed)
Attempted to contact pt's caregiver for discharge, will try again.

## 2015-12-22 NOTE — ED Notes (Signed)
Patient observed during ambulation.  Patient stumbles and loses balances frequently during ambulation.

## 2015-12-22 NOTE — ED Notes (Signed)
Provider at bedside

## 2015-12-22 NOTE — ED Notes (Signed)
Pt calling for ride for discharge

## 2015-12-22 NOTE — ED Triage Notes (Signed)
Pt was seen here earlier today, was dcd and was walking outside and passed out

## 2015-12-23 ENCOUNTER — Telehealth: Payer: Self-pay | Admitting: Neurology

## 2015-12-23 DIAGNOSIS — R569 Unspecified convulsions: Secondary | ICD-10-CM

## 2015-12-23 NOTE — Telephone Encounter (Signed)
Pt called wanting to be seen before Oct. 4th. He was in the ED yesterday had a seizure.  Is not feeling good at all. Doesn't think he can wait until his appointment with Dr. Tomi Likens.  Would like to be seen this week. He gave two numbers to call [phone] and [phone].

## 2015-12-23 NOTE — Telephone Encounter (Signed)
Reviewed ER note from yesterday. It looked like patient's dilantin level was slightly low.  It also stated that patient has been using cocaine.  Since Dr. Tomi Likens is not available to see patient this week, please let me know what advise I can give this patient prior to his scheduled appt.

## 2015-12-23 NOTE — Telephone Encounter (Signed)
Tried to call back and patient was not available. He stated it was his "aide". He told me not to call the other number because it didn't have any minutes. He will have him call back tomorrow.

## 2015-12-23 NOTE — Telephone Encounter (Addendum)
It was hard to get patient to focus and explain any symptoms. He just kept saying he didn't feel good and talked about having multiple seizures on Saturday into Sunday morning. After naming specific symptoms, he does say he is having nausea and dizziness. He states he is having "wooziness" and the "symptoms in his head".   He was made aware Dr. Tomi Likens is not here this week but is insistent that he is seen. I let him know I was talking with the other doctor's for advise and he wasn't happy with that. He denies missing any seizure medication. Last seizure before this patient states was "awhile ago" I could not get him to pinpoint a time frame.   Please advise.

## 2015-12-23 NOTE — Telephone Encounter (Signed)
Dilantin level was low. Pls confirm if he is taking 200mg  in AM, 50mg  in PM as instructed by Dr. Tomi Likens last October. If he is, would recommend increasing evening dose: Take 200mg  in AM, 100mg  in PM (take 2 of the 50mg  tablets). Re-check level in a week then follow-up with Dr. Tomi Likens, thanks

## 2015-12-23 NOTE — Telephone Encounter (Signed)
Please find out from the patient what "not feeling good" means so that we can give him some advice.

## 2015-12-24 NOTE — Addendum Note (Signed)
Addended byAnnamaria Helling on: 12/24/2015 02:18 PM   Modules accepted: Orders

## 2015-12-24 NOTE — Telephone Encounter (Signed)
Patient made aware. He will come back next week to check into the lab to have levels redrawn. He states he does not need a refill right now.

## 2015-12-26 ENCOUNTER — Telehealth: Payer: Self-pay | Admitting: Neurology

## 2015-12-26 NOTE — Telephone Encounter (Signed)
Attempted to contact patient to let him know that we only have an opening on 01-20-16.  No answer and no voicemail.

## 2015-12-26 NOTE — Telephone Encounter (Signed)
PT is a PT of Dr Georgie Chard and he called and said he just got out of the hospital and needs to been seen right away/Dawn [phone]

## 2015-12-31 ENCOUNTER — Telehealth: Payer: Self-pay

## 2015-12-31 NOTE — Telephone Encounter (Signed)
-----   Message from Pieter Partridge, DO sent at 12/31/2015  7:02 AM EDT ----- Patient had a recent seizure and has upcoming appointment.  I want to reiterate what Dr. Delice Lesch said and make sure this has been covered:  " Dilantin level was low. [please] confirm if he is taking 200mg  in AM, 50mg  in PM as instructed by Dr. Tomi Likens last October. If he is, would recommend increasing evening dose: Take 200mg  in AM, 100mg  in PM (take 2 of the 50mg  tablets). Re-check level in a week"  This message was from 12/23/15.

## 2015-12-31 NOTE — Telephone Encounter (Signed)
Attempted to reach pt on both lines in chart, and number left at last contact. Left a message with his neighbor to please have him call us. VM was left on number listed on chart. Pt was placed on schedule for tomorrow.

## 2016-01-01 ENCOUNTER — Ambulatory Visit: Payer: Medicare Other | Admitting: Neurology

## 2016-01-03 ENCOUNTER — Ambulatory Visit: Payer: Medicare Other

## 2016-01-03 ENCOUNTER — Other Ambulatory Visit: Payer: Medicare Other

## 2016-01-03 ENCOUNTER — Telehealth: Payer: Self-pay

## 2016-01-03 DIAGNOSIS — G40209 Localization-related (focal) (partial) symptomatic epilepsy and epileptic syndromes with complex partial seizures, not intractable, without status epilepticus: Secondary | ICD-10-CM

## 2016-01-03 LAB — CBC
HEMATOCRIT: 41.6 % (ref 38.5–50.0)
Hemoglobin: 14.2 g/dL (ref 13.2–17.1)
MCH: 30.2 pg (ref 27.0–33.0)
MCHC: 34.1 g/dL (ref 32.0–36.0)
MCV: 88.5 fL (ref 80.0–100.0)
MPV: 13.2 fL — ABNORMAL HIGH (ref 7.5–12.5)
PLATELETS: 226 10*3/uL (ref 140–400)
RBC: 4.7 MIL/uL (ref 4.20–5.80)
RDW: 13.8 % (ref 11.0–15.0)
WBC: 7 10*3/uL (ref 3.8–10.8)

## 2016-01-03 LAB — HEPATIC FUNCTION PANEL
ALBUMIN: 4.2 g/dL (ref 3.6–5.1)
ALK PHOS: 54 U/L (ref 40–115)
ALT: 21 U/L (ref 9–46)
AST: 23 U/L (ref 10–35)
BILIRUBIN INDIRECT: 0.3 mg/dL (ref 0.2–1.2)
Bilirubin, Direct: 0.1 mg/dL (ref <=0.2)
TOTAL PROTEIN: 7.3 g/dL (ref 6.1–8.1)
Total Bilirubin: 0.4 mg/dL (ref 0.2–1.2)

## 2016-01-03 LAB — TSH: TSH: 0.75 mIU/L (ref 0.40–4.50)

## 2016-01-03 NOTE — Telephone Encounter (Signed)
Pt called to see why appointment was cancelled. Pt was itrate. Attempted to explain to patient that when message was received on 12/31/15 after returning to office I had a message that pt needed a sooner appointment. We did have a cancellation that patient was placed in. At that point in time I contacted every number listed in patients chart (including [phone] and [phone] and [phone]) I was able to speak to a neighbor on one of those lines. Asked that he have pt call when he spoke to him. Appointment was for 10 am the next morning, by 3 p.m. When he had not received a call back from patient to confirm that he could make appointment, or that he had even received the message. Appointment was cancelled and given to patients who we were able to confirm with. Pt called today (has already spoken to Teddi about situation) and was verbally abusive, after many attempts to calm patient so we could discuss situation reasonably and pt refused line was disconnected. Pt was aware line was to be disconnected.

## 2016-01-04 LAB — PHENYTOIN LEVEL, TOTAL: PHENYTOIN LVL: 18.7 ug/mL (ref 10.0–20.0)

## 2016-01-04 LAB — VALPROIC ACID LEVEL: Valproic Acid Lvl: 74.8 ug/mL (ref 50.0–100.0)

## 2016-01-07 ENCOUNTER — Emergency Department (HOSPITAL_COMMUNITY)
Admission: EM | Admit: 2016-01-07 | Discharge: 2016-01-07 | Disposition: A | Discharge status: Home / Self Care | Payer: Medicare Other | Attending: Emergency Medicine | Admitting: Emergency Medicine

## 2016-01-07 ENCOUNTER — Encounter (HOSPITAL_COMMUNITY): Payer: Self-pay | Admitting: Emergency Medicine

## 2016-01-07 DIAGNOSIS — E119 Type 2 diabetes mellitus without complications: Secondary | ICD-10-CM | POA: Insufficient documentation

## 2016-01-07 DIAGNOSIS — F17210 Nicotine dependence, cigarettes, uncomplicated: Secondary | ICD-10-CM | POA: Insufficient documentation

## 2016-01-07 DIAGNOSIS — Z79899 Other long term (current) drug therapy: Secondary | ICD-10-CM | POA: Insufficient documentation

## 2016-01-07 DIAGNOSIS — G40909 Epilepsy, unspecified, not intractable, without status epilepticus: Secondary | ICD-10-CM | POA: Diagnosis not present

## 2016-01-07 LAB — CBC WITH DIFFERENTIAL/PLATELET
Basophils Absolute: 0 10*3/uL (ref 0.0–0.1)
Basophils Relative: 0 %
EOS PCT: 2 %
Eosinophils Absolute: 0.1 10*3/uL (ref 0.0–0.7)
HCT: 43 % (ref 39.0–52.0)
Hemoglobin: 13.6 g/dL (ref 13.0–17.0)
LYMPHS ABS: 3.3 10*3/uL (ref 0.7–4.0)
LYMPHS PCT: 56 %
MCH: 29.5 pg (ref 26.0–34.0)
MCHC: 31.6 g/dL (ref 30.0–36.0)
MCV: 93.3 fL (ref 78.0–100.0)
MONO ABS: 0.5 10*3/uL (ref 0.1–1.0)
MONOS PCT: 9 %
Neutro Abs: 1.9 10*3/uL (ref 1.7–7.7)
Neutrophils Relative %: 33 %
PLATELETS: 193 10*3/uL (ref 150–400)
RBC: 4.61 MIL/uL (ref 4.22–5.81)
RDW: 14.7 % (ref 11.5–15.5)
WBC: 5.8 10*3/uL (ref 4.0–10.5)

## 2016-01-07 LAB — COMPREHENSIVE METABOLIC PANEL
ALT: 24 U/L (ref 17–63)
AST: 27 U/L (ref 15–41)
Albumin: 3.9 g/dL (ref 3.5–5.0)
Alkaline Phosphatase: 50 U/L (ref 38–126)
Anion gap: 5 (ref 5–15)
BILIRUBIN TOTAL: 0.3 mg/dL (ref 0.3–1.2)
BUN: 17 mg/dL (ref 6–20)
CHLORIDE: 107 mmol/L (ref 101–111)
CO2: 31 mmol/L (ref 22–32)
CREATININE: 0.88 mg/dL (ref 0.61–1.24)
Calcium: 9.2 mg/dL (ref 8.9–10.3)
Glucose, Bld: 90 mg/dL (ref 65–99)
POTASSIUM: 4.3 mmol/L (ref 3.5–5.1)
Sodium: 143 mmol/L (ref 135–145)
TOTAL PROTEIN: 7.3 g/dL (ref 6.5–8.1)

## 2016-01-07 LAB — VALPROIC ACID LEVEL: VALPROIC ACID LVL: 98 ug/mL (ref 50.0–100.0)

## 2016-01-07 LAB — PHENYTOIN LEVEL, TOTAL: PHENYTOIN LVL: 15.8 ug/mL (ref 10.0–20.0)

## 2016-01-07 MED ORDER — LORAZEPAM 1 MG PO TABS
1.0000 mg | ORAL_TABLET | Freq: Once | ORAL | Status: AC
  Administered 2016-01-07: 1 mg via ORAL
  Filled 2016-01-07: qty 1

## 2016-01-07 MED ORDER — LORAZEPAM 1 MG PO TABS: 1.0000 mg | ORAL_TABLET | Freq: Two times a day (BID) | ORAL | 0 refills | Status: DC | PRN

## 2016-01-07 NOTE — ED Notes (Signed)
Patient has already d/c'd himself from monitor, continuous pulse oximetry and blood pressure cuff; patient is getting dressed; patient stated "the doctor just left and he told me I could go and get dressed 'cause he was letting me go"

## 2016-01-07 NOTE — ED Triage Notes (Signed)
Patient states he walks to work "all the time".  Patient states he had seizures about 2 weeks ago, but has not had one since.   Patient states that he "feels like he does when I have a seizure".   Patient denies any seizures today.   [facility], and picked patient up for "dizzy and R eye twitching".   Patient states when he was here 2 weeks ago, he was told to follow up with Dr. Cay Schillings, but they could not make him an appointment 01/29/16.   Patient states he can't wait that long.   Patient CBG 138 with EMS, 148/90, 66, NSR.  Denies N/V.   Patient told EMS he has grand mal seizures when he has them.   Patient states is compliant with his medications.    No IV established by EMS.

## 2016-01-07 NOTE — ED Notes (Signed)
Valproic Acid 98 per lab.  Dr. Lacinda Axon aware.

## 2016-01-07 NOTE — ED Notes (Signed)
Warm blanket given

## 2016-01-07 NOTE — ED Provider Notes (Signed)
[facility] DEPT Provider Note   CSN: [id number] Arrival date & time: 01/07/16  [id number]     History   Chief Complaint Chief Complaint  Patient presents with  . Seizures    HPI [name] is a 60 y.o. male.  Patient who has a known seizure disorder presents with a sense that he may have another seizure. However, he has had no frank seizures lately. He claims to be taking his medications which include Depakote, Keppra, Dilantin. No prodromal illnesses. His symptoms for the sensation of a pre-seizure are eyes blinking and twitching. He drinks alcohol and smokes cigarettes. No street drugs. He is a type II diabetic.      Past Medical History:  Diagnosis Date  . Blindness of right eye   . Diabetes mellitus   . Epilepsy ([place])    As reported by patient  . Seizures [facility])    since age 88    Patient Active Problem List   Diagnosis Date Noted  . Localization-related (focal) (partial) symptomatic epilepsy and epileptic syndromes with complex partial seizures, not intractable, without status epilepticus ([place]) 01/29/2015  . Blindness of right eye   . LUNG ABSCESS 03/14/2008    History reviewed. No pertinent surgical history.     Home Medications    Prior to Admission medications   Medication Sig Start Date End Date Taking? Authorizing Provider  divalproex (DEPAKOTE ER) 500 MG 24 hr tablet Take 4 tablets in morning and 4 tablets at night daily 01/29/15  Yes Pieter Partridge, DO  levETIRAcetam (KEPPRA) 750 MG tablet Take 1 tablet (750 mg total) by mouth 2 (two) times daily. 01/29/15  Yes Adam Telford Nab, DO  phenytoin (DILANTIN) 100 MG ER capsule Take 2 capsules (200 mg total) by mouth daily. 01/29/15  Yes Adam Telford Nab, DO  phenytoin (DILANTIN) 50 MG tablet Chew 1 tablet (50 mg total) by mouth daily. Patient taking differently: Chew 50 mg by mouth every evening.  01/29/15  Yes Adam Telford Nab, DO  LORazepam (ATIVAN) 1 MG tablet Take 1 tablet (1 mg total) by mouth 2 (two) times daily as  needed for anxiety. Take medication if you feel like a seizure may be coming on. 01/07/16   Nat Christen, MD    Family History Family History  Problem Relation Age of Onset  . Diabetes Mother   . Hypertension Mother     Social History Social History  Substance Use Topics  . Smoking status: Current Every Day Smoker    Packs/day: 0.25    Types: Cigarettes  . Smokeless tobacco: Never Used  . Alcohol use Yes     Comment: one 40 oz beer each day     Allergies   Review of patient's allergies indicates no known allergies.   Review of Systems Review of Systems  All other systems reviewed and are negative.    Physical Exam Updated Vital Signs BP 127/91   Pulse 65   Temp 97.8 F (36.6 C) (Oral)   Resp 11   Ht 5\' 5"  (1.651 m)   Wt 130 lb (59 kg)   SpO2 100%   BMI 21.63 kg/m   Physical Exam  Constitutional: He is oriented to person, place, and time. He appears well-developed and well-nourished.  HENT:  Head: Normocephalic and atraumatic.  Eyes: Conjunctivae are normal.  Neck: Neck supple.  Cardiovascular: Normal rate and regular rhythm.   Pulmonary/Chest: Effort normal and breath sounds normal.  Abdominal: Soft. Bowel sounds are normal.  Musculoskeletal: Normal  range of motion.  Neurological: He is alert and oriented to person, place, and time.  No seizure activity noted.  Skin: Skin is warm and dry.  Psychiatric: He has a normal mood and affect. His behavior is normal.  Nursing note and vitals reviewed.    ED Treatments / Results  Labs (all labs ordered are listed, but only abnormal results are displayed) Labs Reviewed  CBC WITH DIFFERENTIAL/PLATELET  COMPREHENSIVE METABOLIC PANEL  PHENYTOIN LEVEL, TOTAL  VALPROIC ACID LEVEL  LEVETIRACETAM LEVEL    EKG  EKG Interpretation  Date/Time:  Tuesday January 07 2016 07:27:34 EDT Ventricular Rate:  60 PR Interval:    QRS Duration: 92 QT Interval:  391 QTC Calculation: 391 R Axis:   1 Text  Interpretation:  Sinus rhythm Borderline ST elevation, anterior leads Confirmed by Lacinda Axon  MD, Kathyann Spaugh ([id number]) on 01/07/2016 7:57:27 AM       Radiology No results found.  Procedures Procedures (including critical care time)  Medications Ordered in ED Medications  LORazepam (ATIVAN) tablet 1 mg (1 mg Oral Given 01/07/16 0829)     Initial Impression / Assessment and Plan / ED Course  I have reviewed the triage vital signs and the nursing notes.  Pertinent labs & imaging results that were available during my care of the patient were reviewed by me and considered in my medical decision making (see chart for details).  Clinical Course    Patient is stable neurologically. Phenytoin and valproic acid levels are normal. Keppra level pending. Patient has primary care follow-up. I will prescribe Ativan 1 mg when necessary for anxiety or nervousness  Final Clinical Impressions(s) / ED Diagnoses   Final diagnoses:  Seizure disorder (HCC)    New Prescriptions New Prescriptions   LORAZEPAM (ATIVAN) 1 MG TABLET    Take 1 tablet (1 mg total) by mouth 2 (two) times daily as needed for anxiety. Take medication if you feel like a seizure may be coming on.     Nat Christen, MD 01/07/16 1119

## 2016-01-07 NOTE — Discharge Instructions (Signed)
Follow-up with your primary care doctor. Prescription for medicine to help you relax if you feel like a seizure is coming on.

## 2016-01-07 NOTE — ED Notes (Signed)
Patient undressed, in gown, on monitor, continuous pulse oximetry and blood pressure cuff 

## 2016-01-09 LAB — LEVETIRACETAM LEVEL: Levetiracetam Lvl: 41.4 ug/mL — ABNORMAL HIGH (ref 10.0–40.0)

## 2016-01-13 ENCOUNTER — Ambulatory Visit (INDEPENDENT_AMBULATORY_CARE_PROVIDER_SITE_OTHER): Payer: Medicare Other | Admitting: Neurology

## 2016-01-13 ENCOUNTER — Encounter: Payer: Self-pay | Admitting: Neurology

## 2016-01-13 VITALS — BP 104/66 | HR 86 | Ht 65.0 in | Wt 142.0 lb

## 2016-01-13 DIAGNOSIS — G40209 Localization-related (focal) (partial) symptomatic epilepsy and epileptic syndromes with complex partial seizures, not intractable, without status epilepticus: Secondary | ICD-10-CM | POA: Diagnosis not present

## 2016-01-13 DIAGNOSIS — F17200 Nicotine dependence, unspecified, uncomplicated: Secondary | ICD-10-CM | POA: Diagnosis not present

## 2016-01-13 DIAGNOSIS — F1990 Other psychoactive substance use, unspecified, uncomplicated: Secondary | ICD-10-CM

## 2016-01-13 NOTE — Patient Instructions (Addendum)
1.  Continue phenytoin 200mg  in morning and 50mg  at night, Keppra 750mg  twice daily and Depakote ER 2000mg  twice daily 2.  Recheck CBC, CMP and trough valproic acid and phenytoin levels in 6 months.  Then follow up soon afterwards.

## 2016-01-13 NOTE — Progress Notes (Signed)
NEUROLOGY FOLLOW UP OFFICE NOTE  [name] JS:[id number]  HISTORY OF PRESENT ILLNESS: [name] is a 60 year old right-handed man with diabetes mellitus and right eye blindness who presents for follow-up regarding localization-related epilepsy.     UPDATE: He had been on Depakote ER 2000mg  twice daily, phenytoin 200mg  in AM and 50mg  at night, Keppra 750mg  twice daily.  He is taking calcium and vitamin D.  Last month, he began feeling off-balance and drowsy, like the aura to his seizures.  He presented to the ED on 12/22/15 for further evaluation.  Dilantin level was 9.3.  Depakote level was 67.  BMP showed Na 141, K 3.7, BUN 22, Cr 0.79 and calcium 8.9.  He was given 200mg  of Dilantin in the ED.  He apparently had not been taking Dilantin as prescribed, but he says that he has been.  He was instructed again on the dose.  Repeat labs from 01/03/16 looked good, which showed valproic acid level of 74.8; phenytoin level of 18.7; CBC with WBC 7, HGB 14.2, HCT 41.6 and PLT 226; hepatic panel with total bili 0.4, ALP 54, AST 23 and ALT 21; and TSH 0.75.  However, he felt another aura and returned to the ED on 01/07/16.  He reported medication compliance and no illness.  He had no seizures.  Labs still looked okay with normal CBC and CMP, as well as phenytoin level of 15.8, valproic acid level 98, and levetiracetam level of 41.4.  He was discharged on Ativan 1mg  for anxiety.  He reports that he actually had seizures in the ED, but ED notes mention no witnessed seizures.    HISTORY: He began having seizures at age 82.  It may have been precipitated by a head injury after falling out of his crib.     Semiology:  Aura of colored lights in right visual field.  It may last several minutes.  Usually the warning of the aura gives him time to lay down and call the ambulance.  Sometimes it resolves, while other times it progresses to a full-blown seizure, described as loss of consciousness, generalized jerking and  tongue biting.  He averages approximately 1-2 seizures per year.  But he says last seizure was around 1 to 1.5 years ago.  Prior medication:  phenobarbital.  Higher doses of Dilantin caused ataxia.   09/20/09 MRI Brain w/o:  mild generalized atrophy with prominence to the cerebellum.  Post-operative changes of right globe.  PAST MEDICAL HISTORY: Past Medical History:  Diagnosis Date  . Blindness of right eye   . Diabetes mellitus   . Epilepsy ([place])    As reported by patient  . Seizures ([place])    since age 64    MEDICATIONS: Current Outpatient Prescriptions on File Prior to Visit  Medication Sig Dispense Refill  . divalproex (DEPAKOTE ER) 500 MG 24 hr tablet Take 4 tablets in morning and 4 tablets at night daily 240 tablet 11  . levETIRAcetam (KEPPRA) 750 MG tablet Take 1 tablet (750 mg total) by mouth 2 (two) times daily. 60 tablet 11  . LORazepam (ATIVAN) 1 MG tablet Take 1 tablet (1 mg total) by mouth 2 (two) times daily as needed for anxiety. Take medication if you feel like a seizure may be coming on. 20 tablet 0  . phenytoin (DILANTIN) 100 MG ER capsule Take 2 capsules (200 mg total) by mouth daily. 60 capsule 11  . phenytoin (DILANTIN) 50 MG tablet Chew 1 tablet (50 mg  total) by mouth daily. (Patient taking differently: Chew 50 mg by mouth every evening. ) 30 tablet 11   No current facility-administered medications on file prior to visit.     ALLERGIES: No Known Allergies  FAMILY HISTORY: Family History  Problem Relation Age of Onset  . Diabetes Mother   . Hypertension Mother     SOCIAL HISTORY: Social History   Social History  . Marital status: Single    Spouse name: N/A  . Number of children: N/A  . Years of education: N/A   Occupational History  . Not on file.   Social History Main Topics  . Smoking status: Current Every Day Smoker    Packs/day: 0.25    Types: Cigarettes  . Smokeless tobacco: Never Used  . Alcohol use Yes     Comment: one 40 oz beer each  day  . Drug use:     Types: Cocaine     Comment: cocaine--last used 2weeks ago  . Sexual activity: No   Other Topics Concern  . Not on file   Social History Narrative  . No narrative on file    REVIEW OF SYSTEMS: Constitutional: No fevers, chills, or sweats, no generalized fatigue, change in appetite Eyes: No visual changes, double vision, eye pain Ear, nose and throat: No hearing loss, ear pain, nasal congestion, sore throat Cardiovascular: No chest pain, palpitations Respiratory:  No shortness of breath at rest or with exertion, wheezes GastrointestinaI: No nausea, vomiting, diarrhea, abdominal pain, fecal incontinence Genitourinary:  No dysuria, urinary retention or frequency Musculoskeletal:  No neck pain, back pain Integumentary: No rash, pruritus, skin lesions Neurological: as above Psychiatric: No depression, insomnia, anxiety Endocrine: No palpitations, fatigue, diaphoresis, mood swings, change in appetite, change in weight, increased thirst Hematologic/Lymphatic:  No purpura, petechiae. Allergic/Immunologic: no itchy/runny eyes, nasal congestion, recent allergic reactions, rashes  PHYSICAL EXAM: Vitals:   01/13/16 1252  BP: 104/66  Pulse: 86   General: No acute distress.   Head:  Normocephalic/atraumatic Eyes:  Fundi examined but not visualized Neck: supple, no paraspinal tenderness, full range of motion Heart:  Regular rate and rhythm Lungs:  Clear to auscultation bilaterally Back: No paraspinal tenderness Neurological Exam: alert and oriented to person, place, and time. Attention span and concentration intact, recent and remote memory intact, fund of knowledge intact.  Speech fluent and not dysarthric, language intact.  Right eye blindness, fundi not visualized, otherwise CN II-XII intact, bulk and tone normal, muscle strength 5/5 throughout, sensation to light touch, temperature and vibration intact, deep tendon reflexes 2+ throughout, toes downgoing, finger to  nose intact, gait normal, Romberg negative.  IMPRESSION: Complex partial seizures with secondary generalization.  History not clear.  He reports medication compliance, which has been questioned in the chart.  Also, reports having had seizures in the ED, but notes mention no seizures while under observation in the ED. Drug abuse (cocaine) Cigarette smoker  PLAN: 1.  Therapeutic on current regimen.  Will continue  Depakote ER 2000mg  twice daily, phenytoin 200mg  in AM and 50mg  at night, Keppra 750mg  twice daily.   2.  Abstain from drug use and smoking 3.  Repeat CBC, CMP, and trough valproic acid and phenytoin levels in 6 months. 4.  Follow up in 6 months after labs.  25 minutes spent face to face with patient, over 50% spent counseling.  Metta Clines, DO  CC:  Collins Scotland, NP

## 2016-01-20 ENCOUNTER — Other Ambulatory Visit: Payer: Self-pay | Admitting: Neurology

## 2016-01-29 ENCOUNTER — Ambulatory Visit: Payer: Medicare Other | Admitting: Neurology

## 2016-02-03 ENCOUNTER — Other Ambulatory Visit: Payer: Self-pay | Admitting: Neurology

## 2016-02-05 ENCOUNTER — Encounter: Payer: Self-pay | Admitting: Neurology

## 2016-02-05 ENCOUNTER — Other Ambulatory Visit: Payer: Self-pay

## 2016-02-05 MED ORDER — DIVALPROEX SODIUM ER 500 MG PO TB24: 2000.0000 mg | ORAL_TABLET | Freq: Two times a day (BID) | ORAL | 11 refills | Status: DC

## 2016-02-05 MED ORDER — PHENYTOIN 50 MG PO CHEW: 50.0000 mg | CHEWABLE_TABLET | Freq: Every evening | ORAL | 5 refills | Status: DC

## 2016-02-05 MED ORDER — PHENYTOIN SODIUM EXTENDED 100 MG PO CAPS: 200.0000 mg | ORAL_CAPSULE | Freq: Every morning | ORAL | 6 refills | Status: DC

## 2016-02-05 MED ORDER — LEVETIRACETAM 750 MG PO TABS: 750.0000 mg | ORAL_TABLET | Freq: Two times a day (BID) | ORAL | 5 refills | Status: DC

## 2016-02-05 NOTE — Telephone Encounter (Signed)
1.  Therapeutic on current regimen.  Will continue  Depakote ER 2000mg  twice daily, phenytoin 200mg  in AM and 50mg  at night, Keppra 750mg  twice daily.   2.  Abstain from drug use and smoking 3.  Repeat CBC, CMP, and trough valproic acid and phenytoin levels in 6 months. 4.  Follow up in 6 months after labs.

## 2016-02-10 ENCOUNTER — Ambulatory Visit: Payer: Medicare Other | Admitting: Neurology

## 2016-04-30 ENCOUNTER — Emergency Department (HOSPITAL_COMMUNITY): Payer: Medicare Other

## 2016-04-30 ENCOUNTER — Observation Stay (HOSPITAL_COMMUNITY)
Admission: EM | Admit: 2016-04-30 | Discharge: 2016-05-02 | Disposition: A | Discharge status: Home / Self Care | Payer: Medicare Other | Attending: Internal Medicine | Admitting: Internal Medicine

## 2016-04-30 ENCOUNTER — Encounter (HOSPITAL_COMMUNITY): Payer: Self-pay | Admitting: Emergency Medicine

## 2016-04-30 DIAGNOSIS — M79604 Pain in right leg: Secondary | ICD-10-CM

## 2016-04-30 DIAGNOSIS — Z7984 Long term (current) use of oral hypoglycemic drugs: Secondary | ICD-10-CM | POA: Insufficient documentation

## 2016-04-30 DIAGNOSIS — M4316 Spondylolisthesis, lumbar region: Secondary | ICD-10-CM | POA: Diagnosis not present

## 2016-04-30 DIAGNOSIS — R509 Fever, unspecified: Secondary | ICD-10-CM | POA: Diagnosis present

## 2016-04-30 DIAGNOSIS — I700 Atherosclerosis of aorta: Secondary | ICD-10-CM | POA: Diagnosis not present

## 2016-04-30 DIAGNOSIS — M5431 Sciatica, right side: Secondary | ICD-10-CM | POA: Diagnosis present

## 2016-04-30 DIAGNOSIS — G40209 Localization-related (focal) (partial) symptomatic epilepsy and epileptic syndromes with complex partial seizures, not intractable, without status epilepticus: Secondary | ICD-10-CM | POA: Diagnosis not present

## 2016-04-30 DIAGNOSIS — M545 Low back pain, unspecified: Secondary | ICD-10-CM

## 2016-04-30 DIAGNOSIS — H5461 Unqualified visual loss, right eye, normal vision left eye: Secondary | ICD-10-CM | POA: Insufficient documentation

## 2016-04-30 DIAGNOSIS — E119 Type 2 diabetes mellitus without complications: Secondary | ICD-10-CM | POA: Diagnosis not present

## 2016-04-30 DIAGNOSIS — M4726 Other spondylosis with radiculopathy, lumbar region: Secondary | ICD-10-CM | POA: Diagnosis not present

## 2016-04-30 DIAGNOSIS — H5440 Blindness, one eye, unspecified eye: Secondary | ICD-10-CM | POA: Diagnosis not present

## 2016-04-30 DIAGNOSIS — M5116 Intervertebral disc disorders with radiculopathy, lumbar region: Principal | ICD-10-CM | POA: Insufficient documentation

## 2016-04-30 DIAGNOSIS — I708 Atherosclerosis of other arteries: Secondary | ICD-10-CM | POA: Insufficient documentation

## 2016-04-30 DIAGNOSIS — R05 Cough: Secondary | ICD-10-CM

## 2016-04-30 DIAGNOSIS — M5416 Radiculopathy, lumbar region: Secondary | ICD-10-CM

## 2016-04-30 DIAGNOSIS — R059 Cough, unspecified: Secondary | ICD-10-CM

## 2016-04-30 HISTORY — DX: Sciatica, right side: M54.31

## 2016-04-30 LAB — BASIC METABOLIC PANEL
Anion gap: 10 (ref 5–15)
BUN: 19 mg/dL (ref 6–20)
CO2: 24 mmol/L (ref 22–32)
CREATININE: 0.8 mg/dL (ref 0.61–1.24)
Calcium: 8.8 mg/dL — ABNORMAL LOW (ref 8.9–10.3)
Chloride: 103 mmol/L (ref 101–111)
GFR calc Af Amer: >60 mL/min (ref >60)
Glucose, Bld: 106 mg/dL — ABNORMAL HIGH (ref 65–99)
POTASSIUM: 3.9 mmol/L (ref 3.5–5.1)
SODIUM: 137 mmol/L (ref 135–145)

## 2016-04-30 LAB — CBC WITH DIFFERENTIAL/PLATELET
BASOS ABS: 0 10*3/uL (ref 0.0–0.1)
BASOS PCT: 0 %
EOS ABS: 0 10*3/uL (ref 0.0–0.7)
EOS PCT: 0 %
HCT: 40.5 % (ref 39.0–52.0)
Hemoglobin: 13.9 g/dL (ref 13.0–17.0)
LYMPHS ABS: 1.5 10*3/uL (ref 0.7–4.0)
Lymphocytes Relative: 13 %
MCH: 30.2 pg (ref 26.0–34.0)
MCHC: 34.3 g/dL (ref 30.0–36.0)
MCV: 87.9 fL (ref 78.0–100.0)
Monocytes Absolute: 1.7 10*3/uL — ABNORMAL HIGH (ref 0.1–1.0)
Monocytes Relative: 15 %
Neutro Abs: 8.5 10*3/uL — ABNORMAL HIGH (ref 1.7–7.7)
Neutrophils Relative %: 72 %
PLATELETS: 252 10*3/uL (ref 150–400)
RBC: 4.61 MIL/uL (ref 4.22–5.81)
RDW: 14.4 % (ref 11.5–15.5)
WBC: 11.8 10*3/uL — ABNORMAL HIGH (ref 4.0–10.5)

## 2016-04-30 LAB — URINALYSIS, ROUTINE W REFLEX MICROSCOPIC
Bacteria, UA: NONE SEEN
Bilirubin Urine: NEGATIVE
GLUCOSE, UA: NEGATIVE mg/dL
Hgb urine dipstick: NEGATIVE
Ketones, ur: 20 mg/dL — AB
Nitrite: NEGATIVE
PH: 5 (ref 5.0–8.0)
Protein, ur: 30 mg/dL — AB
Specific Gravity, Urine: 1.031 — ABNORMAL HIGH (ref 1.005–1.030)
Squamous Epithelial / LPF: NONE SEEN

## 2016-04-30 LAB — I-STAT CREATININE, ED: Creatinine, Ser: 0.9 mg/dL (ref 0.61–1.24)

## 2016-04-30 MED ORDER — GADOBENATE DIMEGLUMINE 529 MG/ML IV SOLN
12.0000 mL | Freq: Once | INTRAVENOUS | Status: AC | PRN
  Administered 2016-04-30: 19 mL via INTRAVENOUS

## 2016-04-30 MED ORDER — MORPHINE SULFATE (PF) 4 MG/ML IV SOLN
4.0000 mg | Freq: Once | INTRAVENOUS | Status: AC
  Administered 2016-04-30: 4 mg via INTRAVENOUS
  Filled 2016-04-30: qty 1

## 2016-04-30 MED ORDER — KETOROLAC TROMETHAMINE 60 MG/2ML IM SOLN
60.0000 mg | Freq: Once | INTRAMUSCULAR | Status: AC
  Administered 2016-04-30: 60 mg via INTRAMUSCULAR
  Filled 2016-04-30: qty 2

## 2016-04-30 NOTE — H&P (Signed)
History and Physical  Patient Name: [name]     [id number]    DOB: 15-Nov-1955    DOA: 04/30/2016 PCP: Burman Riis, NP  Orthopedics: Dr. Wang, [place]?   Patient coming from: Home  Chief Complaint: Leg pain  HPI: [name] is a 61 y.o. male with a past medical history significant for seizures, blindness in right eye and sciatica who presents with right leg pain for 2 days.  The patient was in his usual state of health until yesterday early in the day when he woke up from a nap and had severe aching constant right leg pain from the right buttock down to the right foot associated with leg weakness and the inability to walk without excruciating pain.  This persisted overnight last night and was no better today so he came to the ER.  ED course: -Febrile to 101.64F, heart rate 87, respirations pulse ox normal, blood pressure 130/84 -Na 137, K 3.9, Cr 0.8, WBC 11.8 K, Hgb 13.9 -Urinalysis with ketones and trace leukocytes -Patient with pain limiting movement of the right leg -Radiographs of the right hip and lumbar spine showed degenerative disease -MR of the low back was obtained to rule out discitis or osteomyelitis and this showed no evidence of discitis or osteomyelitis or spinal abscess, but did show severe degenerative disc disease, with worst findings at the L4-5 level with some L4 neuritis -The case was discussed with Neurosurgery who recommended steroids and PT and outpatient follow up unless significantly worsening -He was given morphine and TRH were asked to observe overnight given pain and inability to walk (patient lives alone), and have PT evaluate tomorrow    Patient did also endorse a few days of cough productive of yellow sputum, fever and chills, and runny nose.  He reports to me that he used to see Dr. Mina Marble from an orthopedics office here in [place] (can't remember where, presumably [facility]) for his leg pain, and used to get shots  in his back for this "only 3 per year", presumably epidural steroids.          ROS: Review of Systems  Constitutional: Positive for chills, fever and malaise/fatigue.  HENT: Positive for congestion.   Respiratory: Positive for cough and sputum production. Negative for shortness of breath.   Cardiovascular: Negative for chest pain, claudication and leg swelling.  Genitourinary: Negative for dysuria, frequency and urgency.  Musculoskeletal: Positive for back pain.  All other systems reviewed and are negative.         Past Medical History:  Diagnosis Date  . Blindness of right eye   . Diabetes mellitus   . Epilepsy ([place])    As reported by patient  . Seizures ([place])    since age 62    History reviewed. No pertinent surgical history.  Social History: Patient lives alone.  The patient walks unassisted.  He still smokes.  He is from [place] originally.    No Known Allergies  Family history: family history includes Diabetes in his mother; Hypertension in his mother.  Prior to Admission medications   Medication Sig Start Date End Date Taking? Authorizing Provider  divalproex (DEPAKOTE ER) 500 MG 24 hr tablet Take 4 tablets (2,000 mg total) by mouth 2 (two) times daily. 02/05/16  Yes Adam Telford Nab, DO  levETIRAcetam (KEPPRA) 750 MG tablet Take 1 tablet (750 mg total) by mouth 2 (two) times daily. 02/05/16  Yes Adam Telford Nab, DO  metFORMIN (GLUCOPHAGE) 500 MG  tablet Take 500 mg by mouth daily with breakfast.  01/27/16  Yes Historical Provider, MD  phenytoin (DILANTIN) 100 MG ER capsule Take 2 capsules (200 mg total) by mouth every morning. 02/05/16  Yes Adam Telford Nab, DO  phenytoin (DILANTIN) 50 MG tablet Chew 1 tablet (50 mg total) by mouth every evening. 02/05/16  Yes Pieter Partridge, DO       Physical Exam: BP 133/91   Pulse 82 Comment: Simultaneous filing. User may not have seen previous data.  Temp 98.9 F (37.2 C) (Oral)   Resp 16   Ht 5\' 5"  (1.651 m)   Wt 59 kg (130  lb)   SpO2 96% Comment: Simultaneous filing. User may not have seen previous data.  BMI 21.63 kg/m  General appearance: Well-developed, adult male, alert and in mild distress from pain.   Eyes: Anicteric, conjunctiva pink, lids and lashes normal on left.  Right eye chronically blind.  Left pupil reactive. ENT: No nasal deformity, discharge, epistaxis.  Hearing normal. OP moist without lesions.   Neck: No neck masses.  Trachea midline.  No thyromegaly/tenderness. Lymph: No cervical or supraclavicular lymphadenopathy. Skin: Warm and dry.  No suspicious rashes or lesions. Cardiac: RRR, nl S1-S2, no murmurs appreciated.  Capillary refill is brisk.  JVP normal.  No LE edema.  Radial and DP pulses 2+ and symmetric. Respiratory: Normal respiratory rate and rhythm.  CTAB without rales or wheezes. Abdomen: Abdomen soft.  No TTP. No ascites, distension, hepatosplenomegaly.   MSK: No deformities or effusions.  No cyanosis or clubbing.     Neuro: Cranial nerves normal except right eye.  Sensation intact to light touch except on right leg, states he has diminished sensation "all over, the whole leg". Speech is fluent.  Patellar reflexes muted bilaterally.  There is pain with movement of the right leg, although he is able to lift it 4/5 strength on the right.  Pain limits movement.  He can bend the right hip to about 45 degrees actively.  He has ?mild weakness of dorsiflexion on the right. Psych: Sensorium intact and responding to questions, attention normal.  Behavior appropriate.  Affect normal.  Judgment and insight appear normal.     Labs on Admission:  I have personally reviewed following labs and imaging studies: CBC:  Recent Labs Lab 04/30/16 2050  WBC 11.8*  NEUTROABS 8.5*  HGB 13.9  HCT 40.5  MCV 87.9  PLT [id number]   Basic Metabolic Panel:  Recent Labs Lab 04/30/16 2050 04/30/16 2132  NA 137  --   K 3.9  --   CL 103  --   CO2 24  --   GLUCOSE 106*  --   BUN 19  --   CREATININE 0.80  0.90  CALCIUM 8.8*  --    GFR: Estimated Creatinine Clearance: 72.8 mL/min (by C-G formula based on SCr of 0.9 mg/dL).  Liver Function Tests: No results for input(s): AST, ALT, ALKPHOS, BILITOT, PROT, ALBUMIN in the last 168 hours. No results for input(s): LIPASE, AMYLASE in the last 168 hours. No results for input(s): AMMONIA in the last 168 hours. Coagulation Profile: No results for input(s): INR, PROTIME in the last 168 hours. Cardiac Enzymes: No results for input(s): CKTOTAL, CKMB, CKMBINDEX, TROPONINI in the last 168 hours. BNP (last 3 results) No results for input(s): PROBNP in the last 8760 hours. HbA1C: No results for input(s): HGBA1C in the last 72 hours. CBG: No results for input(s): GLUCAP in the last 168 hours. Lipid Profile:  No results for input(s): CHOL, HDL, LDLCALC, TRIG, CHOLHDL, LDLDIRECT in the last 72 hours. Thyroid Function Tests: No results for input(s): TSH, T4TOTAL, FREET4, T3FREE, THYROIDAB in the last 72 hours. Anemia Panel: No results for input(s): VITAMINB12, FOLATE, FERRITIN, TIBC, IRON, RETICCTPCT in the last 72 hours. Sepsis Labs: Invalid input(s): PROCALCITONIN, LACTICIDVEN No results found for this or any previous visit (from the past 240 hour(s)).       Radiological Exams on Admission: Personally reviewed x-ray and MRI reprots: Dg Lumbar Spine Complete  Result Date: 04/30/2016 CLINICAL DATA:  Right leg and back pain EXAM: LUMBAR SPINE - COMPLETE 4+ VIEW COMPARISON:  05/19/2013 lumbar spine MRI report. FINDINGS: There is no evidence of lumbar spine fracture. There is mild facet arthropathy at L4-5 and L5-S1. Minimal retrolisthesis of L3 on L4. Intervertebral disc spaces are maintained with slight narrowing at L5-S1. No acute osseous abnormality nor bone destruction. Aortic atherosclerosis of the abdominal aorta and common iliacs. IMPRESSION: Slight disc space narrowing at L5-S1. L4-5 and L5-S1 degenerative facet arthropathy. Minimal L3 on L4  retrolisthesis. Electronically Signed   By: Ashley Royalty M.D.   On: 04/30/2016 20:21   Mr Lumbar Spine W Wo Contrast  Result Date: 04/30/2016 CLINICAL DATA:  RIGHT back pain radiating to foot beginning this morning, worse with walking. Assess for discitis. History of diabetes, seizures, lung abscess. EXAM: MRI LUMBAR SPINE WITHOUT AND WITH CONTRAST TECHNIQUE: Multiplanar and multiecho pulse sequences of the lumbar spine were obtained without and with intravenous contrast. CONTRAST:  60mL MULTIHANCE GADOBENATE DIMEGLUMINE 529 MG/ML IV SOLN COMPARISON:  Lumbar spine radiograph April 30, 2016 at 2002 hours. MRI of the lumbar spine May 19, 2013 not available for direct comparison, report reviewed. FINDINGS: SEGMENTATION: For the purposes of this report, the last well-formed intervertebral disc will be described as L5-S1. ALIGNMENT: Maintenance of the lumbar lordosis. Minimal grade 1 L3-4 retrolisthesis, grade 1 L4-5 anterolisthesis. Chronic LEFT L4 pars interarticularis defect. VERTEBRAE:Vertebral bodies are intact. Minimal L3-4 disc narrowing, with decreased T2 signal within the disc compatible with mild desiccation. Minimal acute on chronic discogenic endplate changes at 075-GRM with endplate enhancement. Additional mild subacute discogenic endplate changes [id number], [id number]. No suspicious bone marrow signal. No abnormal osseous or intradiscal enhancement. Borderline congenital canal narrowing on the basis of foreshortened pedicles. CONUS MEDULLARIS: Conus medullaris terminates at T12-L1 and demonstrates normal morphology and signal characteristics. Cauda equina is normal. No suspicious cord, leptomeningeal or epidural enhancement. PARASPINAL AND SOFT TISSUES: Bright STIR signal RIGHT L2-3 through L5-S1 paraspinal soft tissues without focal fluid collection. DISC LEVELS: L1-2: Annular bulging. Mild facet arthropathy. No canal stenosis or neural foraminal narrowing. L2-3: No disc bulge. Mild facet arthropathy and  ligamentum flavum redundancy without canal stenosis or neural foraminal narrowing. L3-4: Retrolisthesis. 4 mm broad-based central disc protrusion. Moderate facet arthropathy and ligamentum flavum redundancy. No canal stenosis though partial effacement lateral recess could affect the traversing RIGHT greater than LEFT L4 nerves. Mild to moderate RIGHT, minimal LEFT neural foraminal narrowing. L4-5: Anterolisthesis. Small broad-based disc bulge with superimposed RIGHT central 3 x 4 mm disc extrusion with 9 mm of contiguous superior migration. Severe facet arthropathy, developmentally unfused spinous process. Complex 16 x 25 x 32 mm RIGHT facet synovial cyst extending posteriorly is likely partially calcified. Second 11 x 13 x 26 mm RIGHT facet synovial cyst extends into the paraspinal soft tissues. No canal stenosis. Severe bilateral neural foraminal narrowing slight enhancement of the RIGHT L4 nerve with T2 bright signal proximally. L5-S1: Small broad-based  disc bulge. Moderate RIGHT, mild LEFT facet arthropathy. No canal stenosis. Mild to moderate bilateral neural foraminal narrowing. IMPRESSION: 1. No discitis osteomyelitis. 2. 3 x 4 x 8 mm contiguous RIGHT central L4-5 disc extrusion. 3. Grade 1 L4-5 anterolisthesis, chronic RIGHT L4 pars interarticularis and developmentally unfused spinous process. Severe RIGHT facet arthropathy and bulky synovial cysts extending into the paraspinal soft tissues. Reactive low-grade RIGHT paraspinal muscle strain. 4. No canal stenosis. Neural foraminal narrowing L3-4 through L5-S1: Severe bilaterally at L4-5 with mild RIGHT L4 neuritis. Electronically Signed   By: Elon Alas M.D.   On: 04/30/2016 22:35   Dg Hip Unilat W Or Wo Pelvis 2-3 Views Right  Result Date: 04/30/2016 CLINICAL DATA:  Right leg pain from ankle to top of head and lower back pain. EXAM: DG HIP (WITH OR WITHOUT PELVIS) 2-3V RIGHT COMPARISON:  None. FINDINGS: There is no evidence of hip fracture or  dislocation. There is slight bilateral hip joint space narrowing. Mild degenerative sclerosis of the left SI joint. Pubic rami and pubic symphysis appear intact. Degenerative facet arthropathy of L4-5 and L5-S1 facets on the right. IMPRESSION: Lower lumbar facet arthropathy on the right at L4-5 and L5-S1. Left SI joint osteoarthritis. Mild degenerative joint space narrowing of both hips without acute osseous abnormality. Electronically Signed   By: Ashley Royalty M.D.   On: 04/30/2016 20:11        Assessment/Plan  1. Back and leg pain:  Has pain in buttocks, thigh, but also radiating to foot, consistent with an L4 and probably L5 neuritis, consistent with imaging findings.     -PT eval -Naproxen for three days -Tramadol -Will discuss further with orthopedics if worsening or pain still difficult to control tomorrow -Otherwise should see Neurosurgery as an outpatient, or more likely return to his previous orthopedist   2. Fever:  Influenza like illness.  No leukocytosis, hypoxia, respiratory distress or SIRS criteria. -Check flu and RVP swabs -Check CXR  3. Seizure disorder:  -Continue phenytoin and Depakote and Keppra  4. NIDDM:  -Hold metformin in hospital -SSI with meals           DVT prophylaxis: Lovenox  Code Status: FULL  Family Communication: None present  Disposition Plan: Anticipate non-steroidal anti-inflammatories, tramadol, PT for back/sciatica.   Evaluate fever, suspect influenza or viral illness. Consults called: Neurosurgery overnight, they do not plan to see unless patient worsens or is slow to improve. Admission status: OBS At the point of initial evaluation, it is my clinical opinion that admission for OBSERVATION is reasonable and necessary because the patient's presenting complaints in the context of their chronic conditions represent sufficient risk of deterioration or significant morbidity to constitute reasonable grounds for close observation in the  hospital setting, but that the patient may be medically stable for discharge from the hospital within 24 to 48 hours.    Medical decision making: Patient seen at 11:55 PM on 04/30/2016.  The patient was discussed with Dr. Winfred Leeds.  What exists of the patient's chart was reviewed in depth and summarized above.  Clinical condition: stable.        Edwin Dada [facility] Pager [phone]

## 2016-04-30 NOTE — ED Notes (Signed)
Pt transported to MRI 

## 2016-04-30 NOTE — ED Triage Notes (Signed)
Pt reports entire R leg pain from ankle through top of hip. No known injury. Ambulatory with assistance with EMS.

## 2016-04-30 NOTE — ED Provider Notes (Addendum)
Complains of right-sided paralumbar pain radiating to right foot onset this morning. Pain is worse with walking and he states is been unable to walk since this morning. No other associated symptoms. On exam he is alert nontoxic back without point tenderness or flank tenderness. Left lower extremity without redness or swelling, no pain on passive motion. DP pulses 2+ bilaterally Concern for discitis.   Orlie Dakin, MD 04/30/16 2027  11:10 PM patient reexamined. He is unable to stand due to too much pain on his right lower extremity upon standing. DTRs are symmetric bilaterally at knee jerk ankle jerks toes downward going bilaterally. I spoke with Dr. Saintclair Halsted, neurosurgeon by telephone who reports to me that patient may have an operative, however does not need emergent or urgent surgery. Suggest admission to hospitalist to discover causative agent of fever. After discharge he can be referred to neurosurgery as outpatient or hospitals can consult neurosurgery if they have trouble managing his symptoms. I consulted Dr. Loleta Books who will arrange for overnight stay.  Diagnosis #1 lumbar radiculopathy #2 febrile illness Results for orders placed or performed during the hospital encounter of 04/30/16  CBC with Differential/Platelet  Result Value Ref Range   WBC 11.8 (H) 4.0 - 10.5 K/uL   RBC 4.61 4.22 - 5.81 MIL/uL   Hemoglobin 13.9 13.0 - 17.0 g/dL   HCT 40.5 39.0 - 52.0 %   MCV 87.9 78.0 - 100.0 fL   MCH 30.2 26.0 - 34.0 pg   MCHC 34.3 30.0 - 36.0 g/dL   RDW 14.4 11.5 - 15.5 %   Platelets 252 150 - 400 K/uL   Neutrophils Relative % 72 %   Neutro Abs 8.5 (H) 1.7 - 7.7 K/uL   Lymphocytes Relative 13 %   Lymphs Abs 1.5 0.7 - 4.0 K/uL   Monocytes Relative 15 %   Monocytes Absolute 1.7 (H) 0.1 - 1.0 K/uL   Eosinophils Relative 0 %   Eosinophils Absolute 0.0 0.0 - 0.7 K/uL   Basophils Relative 0 %   Basophils Absolute 0.0 0.0 - 0.1 K/uL  Basic metabolic panel  Result Value Ref Range   Sodium 137  135 - 145 mmol/L   Potassium 3.9 3.5 - 5.1 mmol/L   Chloride 103 101 - 111 mmol/L   CO2 24 22 - 32 mmol/L   Glucose, Bld 106 (H) 65 - 99 mg/dL   BUN 19 6 - 20 mg/dL   Creatinine, Ser 0.80 0.61 - 1.24 mg/dL   Calcium 8.8 (L) 8.9 - 10.3 mg/dL   GFR calc non Af Amer >60 >60 mL/min   GFR calc Af Amer >60 >60 mL/min   Anion gap 10 5 - 15  Urinalysis, Routine w reflex microscopic  Result Value Ref Range   Color, Urine YELLOW YELLOW   APPearance CLOUDY (A) CLEAR   Specific Gravity, Urine 1.031 (H) 1.005 - 1.030   pH 5.0 5.0 - 8.0   Glucose, UA NEGATIVE NEGATIVE mg/dL   Hgb urine dipstick NEGATIVE NEGATIVE   Bilirubin Urine NEGATIVE NEGATIVE   Ketones, ur 20 (A) NEGATIVE mg/dL   Protein, ur 30 (A) NEGATIVE mg/dL   Nitrite NEGATIVE NEGATIVE   Leukocytes, UA LARGE (A) NEGATIVE   RBC / HPF 0-5 0 - 5 RBC/hpf   WBC, UA 6-30 0 - 5 WBC/hpf   Bacteria, UA NONE SEEN NONE SEEN   Squamous Epithelial / LPF NONE SEEN NONE SEEN   Mucous PRESENT   I-stat Creatinine, ED  Result Value Ref Range  Creatinine, Ser 0.90 0.61 - 1.24 mg/dL   Dg Lumbar Spine Complete  Result Date: 04/30/2016 CLINICAL DATA:  Right leg and back pain EXAM: LUMBAR SPINE - COMPLETE 4+ VIEW COMPARISON:  05/19/2013 lumbar spine MRI report. FINDINGS: There is no evidence of lumbar spine fracture. There is mild facet arthropathy at L4-5 and L5-S1. Minimal retrolisthesis of L3 on L4. Intervertebral disc spaces are maintained with slight narrowing at L5-S1. No acute osseous abnormality nor bone destruction. Aortic atherosclerosis of the abdominal aorta and common iliacs. IMPRESSION: Slight disc space narrowing at L5-S1. L4-5 and L5-S1 degenerative facet arthropathy. Minimal L3 on L4 retrolisthesis. Electronically Signed   By: Ashley Royalty M.D.   On: 04/30/2016 20:21   Mr Lumbar Spine W Wo Contrast  Result Date: 04/30/2016 CLINICAL DATA:  RIGHT back pain radiating to foot beginning this morning, worse with walking. Assess for discitis.  History of diabetes, seizures, lung abscess. EXAM: MRI LUMBAR SPINE WITHOUT AND WITH CONTRAST TECHNIQUE: Multiplanar and multiecho pulse sequences of the lumbar spine were obtained without and with intravenous contrast. CONTRAST:  30mL MULTIHANCE GADOBENATE DIMEGLUMINE 529 MG/ML IV SOLN COMPARISON:  Lumbar spine radiograph April 30, 2016 at 2002 hours. MRI of the lumbar spine May 19, 2013 not available for direct comparison, report reviewed. FINDINGS: SEGMENTATION: For the purposes of this report, the last well-formed intervertebral disc will be described as L5-S1. ALIGNMENT: Maintenance of the lumbar lordosis. Minimal grade 1 L3-4 retrolisthesis, grade 1 L4-5 anterolisthesis. Chronic LEFT L4 pars interarticularis defect. VERTEBRAE:Vertebral bodies are intact. Minimal L3-4 disc narrowing, with decreased T2 signal within the disc compatible with mild desiccation. Minimal acute on chronic discogenic endplate changes at 075-GRM with endplate enhancement. Additional mild subacute discogenic endplate changes [id number], [id number]. No suspicious bone marrow signal. No abnormal osseous or intradiscal enhancement. Borderline congenital canal narrowing on the basis of foreshortened pedicles. CONUS MEDULLARIS: Conus medullaris terminates at T12-L1 and demonstrates normal morphology and signal characteristics. Cauda equina is normal. No suspicious cord, leptomeningeal or epidural enhancement. PARASPINAL AND SOFT TISSUES: Bright STIR signal RIGHT L2-3 through L5-S1 paraspinal soft tissues without focal fluid collection. DISC LEVELS: L1-2: Annular bulging. Mild facet arthropathy. No canal stenosis or neural foraminal narrowing. L2-3: No disc bulge. Mild facet arthropathy and ligamentum flavum redundancy without canal stenosis or neural foraminal narrowing. L3-4: Retrolisthesis. 4 mm broad-based central disc protrusion. Moderate facet arthropathy and ligamentum flavum redundancy. No canal stenosis though partial effacement lateral recess  could affect the traversing RIGHT greater than LEFT L4 nerves. Mild to moderate RIGHT, minimal LEFT neural foraminal narrowing. L4-5: Anterolisthesis. Small broad-based disc bulge with superimposed RIGHT central 3 x 4 mm disc extrusion with 9 mm of contiguous superior migration. Severe facet arthropathy, developmentally unfused spinous process. Complex 16 x 25 x 32 mm RIGHT facet synovial cyst extending posteriorly is likely partially calcified. Second 11 x 13 x 26 mm RIGHT facet synovial cyst extends into the paraspinal soft tissues. No canal stenosis. Severe bilateral neural foraminal narrowing slight enhancement of the RIGHT L4 nerve with T2 bright signal proximally. L5-S1: Small broad-based disc bulge. Moderate RIGHT, mild LEFT facet arthropathy. No canal stenosis. Mild to moderate bilateral neural foraminal narrowing. IMPRESSION: 1. No discitis osteomyelitis. 2. 3 x 4 x 8 mm contiguous RIGHT central L4-5 disc extrusion. 3. Grade 1 L4-5 anterolisthesis, chronic RIGHT L4 pars interarticularis and developmentally unfused spinous process. Severe RIGHT facet arthropathy and bulky synovial cysts extending into the paraspinal soft tissues. Reactive low-grade RIGHT paraspinal muscle strain. 4. No canal stenosis. Neural  foraminal narrowing L3-4 through L5-S1: Severe bilaterally at L4-5 with mild RIGHT L4 neuritis. Electronically Signed   By: Elon Alas M.D.   On: 04/30/2016 22:35   Dg Hip Unilat W Or Wo Pelvis 2-3 Views Right  Result Date: 04/30/2016 CLINICAL DATA:  Right leg pain from ankle to top of head and lower back pain. EXAM: DG HIP (WITH OR WITHOUT PELVIS) 2-3V RIGHT COMPARISON:  None. FINDINGS: There is no evidence of hip fracture or dislocation. There is slight bilateral hip joint space narrowing. Mild degenerative sclerosis of the left SI joint. Pubic rami and pubic symphysis appear intact. Degenerative facet arthropathy of L4-5 and L5-S1 facets on the right. IMPRESSION: Lower lumbar facet  arthropathy on the right at L4-5 and L5-S1. Left SI joint osteoarthritis. Mild degenerative joint space narrowing of both hips without acute osseous abnormality. Electronically Signed   By: Ashley Royalty M.D.   On: 04/30/2016 20:11       Orlie Dakin, MD 04/30/16 2334

## 2016-05-01 ENCOUNTER — Observation Stay (HOSPITAL_COMMUNITY): Payer: Medicare Other

## 2016-05-01 DIAGNOSIS — R569 Unspecified convulsions: Secondary | ICD-10-CM | POA: Diagnosis not present

## 2016-05-01 DIAGNOSIS — M5431 Sciatica, right side: Secondary | ICD-10-CM

## 2016-05-01 DIAGNOSIS — R509 Fever, unspecified: Secondary | ICD-10-CM | POA: Diagnosis not present

## 2016-05-01 DIAGNOSIS — M5116 Intervertebral disc disorders with radiculopathy, lumbar region: Secondary | ICD-10-CM | POA: Diagnosis not present

## 2016-05-01 DIAGNOSIS — R262 Difficulty in walking, not elsewhere classified: Secondary | ICD-10-CM | POA: Diagnosis not present

## 2016-05-01 LAB — GLUCOSE, CAPILLARY
GLUCOSE-CAPILLARY: 82 mg/dL (ref 65–99)
GLUCOSE-CAPILLARY: 111 mg/dL — AB (ref 65–99)
GLUCOSE-CAPILLARY: 74 mg/dL (ref 65–99)
Glucose-Capillary: 76 mg/dL (ref 65–99)

## 2016-05-01 LAB — CBC
HCT: 40.4 % (ref 39.0–52.0)
HEMOGLOBIN: 13.8 g/dL (ref 13.0–17.0)
MCH: 30.6 pg (ref 26.0–34.0)
MCHC: 34.2 g/dL (ref 30.0–36.0)
MCV: 89.6 fL (ref 78.0–100.0)
PLATELETS: 224 10*3/uL (ref 150–400)
RBC: 4.51 MIL/uL (ref 4.22–5.81)
RDW: 14.6 % (ref 11.5–15.5)
WBC: 11.7 10*3/uL — AB (ref 4.0–10.5)

## 2016-05-01 LAB — RESPIRATORY PANEL BY PCR
ADENOVIRUS-RVPPCR: NOT DETECTED
Bordetella pertussis: NOT DETECTED
CHLAMYDOPHILA PNEUMONIAE-RVPPCR: NOT DETECTED
CORONAVIRUS HKU1-RVPPCR: NOT DETECTED
CORONAVIRUS NL63-RVPPCR: NOT DETECTED
Coronavirus 229E: NOT DETECTED
Coronavirus OC43: DETECTED — AB
Influenza A: NOT DETECTED
Influenza B: NOT DETECTED
MYCOPLASMA PNEUMONIAE-RVPPCR: NOT DETECTED
Metapneumovirus: NOT DETECTED
PARAINFLUENZA VIRUS 3-RVPPCR: NOT DETECTED
Parainfluenza Virus 1: NOT DETECTED
Parainfluenza Virus 2: NOT DETECTED
Parainfluenza Virus 4: NOT DETECTED
RHINOVIRUS / ENTEROVIRUS - RVPPCR: NOT DETECTED
Respiratory Syncytial Virus: NOT DETECTED

## 2016-05-01 LAB — INFLUENZA PANEL BY PCR (TYPE A & B)
INFLBPCR: NEGATIVE
Influenza A By PCR: NEGATIVE

## 2016-05-01 MED ORDER — ACETAMINOPHEN 325 MG PO TABS
650.0000 mg | ORAL_TABLET | Freq: Four times a day (QID) | ORAL | Status: DC | PRN
  Administered 2016-05-01: 650 mg via ORAL
  Filled 2016-05-01: qty 2

## 2016-05-01 MED ORDER — LEVETIRACETAM 750 MG PO TABS
750.0000 mg | ORAL_TABLET | Freq: Two times a day (BID) | ORAL | Status: DC
  Administered 2016-05-01 – 2016-05-02 (×4): 750 mg via ORAL
  Filled 2016-05-01 (×4): qty 1

## 2016-05-01 MED ORDER — ACETAMINOPHEN 650 MG RE SUPP: 650.0000 mg | Freq: Four times a day (QID) | RECTAL | Status: DC | PRN

## 2016-05-01 MED ORDER — ONDANSETRON HCL 4 MG PO TABS: 4.0000 mg | ORAL_TABLET | Freq: Four times a day (QID) | ORAL | Status: DC | PRN

## 2016-05-01 MED ORDER — NAPROXEN SODIUM 275 MG PO TABS
275.0000 mg | ORAL_TABLET | Freq: Two times a day (BID) | ORAL | Status: DC
  Administered 2016-05-01 – 2016-05-02 (×3): 275 mg via ORAL
  Filled 2016-05-01 (×4): qty 1

## 2016-05-01 MED ORDER — ENOXAPARIN SODIUM 40 MG/0.4ML ~~LOC~~ SOLN: 40.0000 mg | SUBCUTANEOUS | Status: DC

## 2016-05-01 MED ORDER — ONDANSETRON HCL 4 MG/2ML IJ SOLN: 4.0000 mg | Freq: Four times a day (QID) | INTRAMUSCULAR | Status: DC | PRN

## 2016-05-01 MED ORDER — PHENYTOIN 50 MG PO CHEW
50.0000 mg | CHEWABLE_TABLET | Freq: Every evening | ORAL | Status: DC
  Administered 2016-05-01 (×2): 50 mg via ORAL
  Filled 2016-05-01 (×3): qty 1

## 2016-05-01 MED ORDER — INSULIN ASPART 100 UNIT/ML ~~LOC~~ SOLN: 0.0000 [IU] | Freq: Three times a day (TID) | SUBCUTANEOUS | Status: DC

## 2016-05-01 MED ORDER — DIVALPROEX SODIUM ER 500 MG PO TB24
2000.0000 mg | ORAL_TABLET | Freq: Two times a day (BID) | ORAL | Status: DC
  Administered 2016-05-01 – 2016-05-02 (×4): 2000 mg via ORAL
  Filled 2016-05-01 (×4): qty 4

## 2016-05-01 MED ORDER — SENNOSIDES-DOCUSATE SODIUM 8.6-50 MG PO TABS: 1.0000 | ORAL_TABLET | Freq: Every evening | ORAL | Status: DC | PRN

## 2016-05-01 MED ORDER — INSULIN ASPART 100 UNIT/ML ~~LOC~~ SOLN: 0.0000 [IU] | Freq: Every day | SUBCUTANEOUS | Status: DC

## 2016-05-01 MED ORDER — CYCLOBENZAPRINE HCL 5 MG PO TABS: 5.0000 mg | ORAL_TABLET | Freq: Three times a day (TID) | ORAL | Status: DC | PRN

## 2016-05-01 MED ORDER — TRAMADOL HCL 50 MG PO TABS
50.0000 mg | ORAL_TABLET | Freq: Four times a day (QID) | ORAL | Status: DC | PRN
  Administered 2016-05-01 – 2016-05-02 (×4): 50 mg via ORAL
  Filled 2016-05-01 (×4): qty 1

## 2016-05-01 MED ORDER — PHENYTOIN SODIUM EXTENDED 100 MG PO CAPS
200.0000 mg | ORAL_CAPSULE | Freq: Every morning | ORAL | Status: DC
  Administered 2016-05-01 – 2016-05-02 (×2): 200 mg via ORAL
  Filled 2016-05-01 (×2): qty 2

## 2016-05-01 NOTE — Progress Notes (Signed)
PROGRESS NOTE    [name]  [id number] DOB: 1956/04/09 DOA: 04/30/2016 PCP: Burman Riis, NP    Brief Narrative:  61 yo male who presents with the chief complain of right leg pain, acute onset of right lower extremity pain, associated with weakness and ambulatory dysfunction. On the physical examination noted to be febrile, right leg movement limited due to pain. MRI positive for L4 neuritis due to foraminal narrowing. Admitted for pain control.    Assessment & Plan:   Principal Problem:   Sciatica of right side Active Problems:   Blindness of right eye   Localization-related symptomatic epilepsy and epileptic syndromes with complex partial seizures, not intractable, without status epilepticus (HCC)   Fever   1. Right L4 neuritis. Will follow with physical therapy recommendations. Patient will need home health with rolling walker, will continue pain control with acetaminophen, ketorolac and tramadol. Will add muscle relaxant and will observe over the next 24 hours if continue improvement, will not need further inpatient evaluation and will discharge in am. Patient will need home health and home PT. Patient had one dose of 4 mg IV morphine last night.   2. Fever. Patient has been afebrile, wbc down to 11 and mri no signs of deep spine infection. Will continue to hold on antibiotic therapy, continue to follow cell count and temperature curve.   3. T2DM. Capillary glucose 76-82-111 over last 24 hours, will continue iss for glucose coverage and monitor.   4. Seizures. No signs of active seizures, will continue anti-seizure regimen with  Phenytoin, keppra and depakote  DVT prophylaxis: enoxaparin  Code Status: full  Family Communication: I spoke with patient's family at the bedside and all questions were addressed.  Disposition Plan: home   Consultants:     Procedures:    Antimicrobials:     Subjective: Patient with no nausea or vomiting, no dyspnea or chest  pain. Positive pain on the right flank, worse with movement and improved with immobility, moderate in intensity and radiated to the right lower extremity.   Objective: Vitals:   05/01/16 0245 05/01/16 0300 05/01/16 0315 05/01/16 0345  BP: (!) 89/65 95/65 95/71  136/77  Pulse: 76  72 70  Resp:    16  Temp:    98.7 F (37.1 C)  TempSrc:    Oral  SpO2: 96%  97% 100%  Weight:    60.4 kg (133 lb 2.5 oz)  Height:    5\' 5"  (1.651 m)    Intake/Output Summary (Last 24 hours) at 05/01/16 1235 Last data filed at 05/01/16 0853  Gross per 24 hour  Intake              240 ml  Output                0 ml  Net              240 ml   Filed Weights   04/30/16 1846 05/01/16 0345  Weight: 59 kg (130 lb) 60.4 kg (133 lb 2.5 oz)    Examination:  General exam: not in pain or dyspnea E ENT. Mild pallor but no icterus, oral mucosa moist.  Respiratory system: Clear to auscultation. Respiratory effort normal. No wheezing, rales or rhonchi.  Cardiovascular system: S1 & S2 heard, RRR. No JVD, murmurs, rubs, gallops or clicks. No pedal edema. Gastrointestinal system: Abdomen is nondistended, soft and nontender. No organomegaly or masses felt. Normal bowel sounds heard. Central nervous system: Alert and oriented. No  focal neurological deficits. Extremities: Symmetric 5 x 5 power. Positive pain on palpation of the right flank.  Skin: No rashes, lesions or ulcers     Data Reviewed: I have personally reviewed following labs and imaging studies  CBC:  Recent Labs Lab 04/30/16 2050 05/01/16 0606  WBC 11.8* 11.7*  NEUTROABS 8.5*  --   HGB 13.9 13.8  HCT 40.5 40.4  MCV 87.9 89.6  PLT 252 [id number]   Basic Metabolic Panel:  Recent Labs Lab 04/30/16 2050 04/30/16 2132  NA 137  --   K 3.9  --   CL 103  --   CO2 24  --   GLUCOSE 106*  --   BUN 19  --   CREATININE 0.80 0.90  CALCIUM 8.8*  --    GFR: Estimated Creatinine Clearance: 74.6 mL/min (by C-G formula based on SCr of 0.9 mg/dL). Liver  Function Tests: No results for input(s): AST, ALT, ALKPHOS, BILITOT, PROT, ALBUMIN in the last 168 hours. No results for input(s): LIPASE, AMYLASE in the last 168 hours. No results for input(s): AMMONIA in the last 168 hours. Coagulation Profile: No results for input(s): INR, PROTIME in the last 168 hours. Cardiac Enzymes: No results for input(s): CKTOTAL, CKMB, CKMBINDEX, TROPONINI in the last 168 hours. BNP (last 3 results) No results for input(s): PROBNP in the last 8760 hours. HbA1C: No results for input(s): HGBA1C in the last 72 hours. CBG:  Recent Labs Lab 05/01/16 0739 05/01/16 1212  GLUCAP 76 82   Lipid Profile: No results for input(s): CHOL, HDL, LDLCALC, TRIG, CHOLHDL, LDLDIRECT in the last 72 hours. Thyroid Function Tests: No results for input(s): TSH, T4TOTAL, FREET4, T3FREE, THYROIDAB in the last 72 hours. Anemia Panel: No results for input(s): VITAMINB12, FOLATE, FERRITIN, TIBC, IRON, RETICCTPCT in the last 72 hours. Sepsis Labs: No results for input(s): PROCALCITON, LATICACIDVEN in the last 168 hours.  No results found for this or any previous visit (from the past 240 hour(s)).       Radiology Studies: Dg Chest 2 View  Result Date: 05/01/2016 CLINICAL DATA:  Productive cough for 3 or 4 days. EXAM: CHEST  2 VIEW COMPARISON:  04/06/2011 FINDINGS: The heart size and mediastinal contours are within normal limits. Both lungs are clear. The visualized skeletal structures are unremarkable. IMPRESSION: No active cardiopulmonary disease. Electronically Signed   By: Andreas Newport M.D.   On: 05/01/2016 01:01   Dg Lumbar Spine Complete  Result Date: 04/30/2016 CLINICAL DATA:  Right leg and back pain EXAM: LUMBAR SPINE - COMPLETE 4+ VIEW COMPARISON:  05/19/2013 lumbar spine MRI report. FINDINGS: There is no evidence of lumbar spine fracture. There is mild facet arthropathy at L4-5 and L5-S1. Minimal retrolisthesis of L3 on L4. Intervertebral disc spaces are maintained  with slight narrowing at L5-S1. No acute osseous abnormality nor bone destruction. Aortic atherosclerosis of the abdominal aorta and common iliacs. IMPRESSION: Slight disc space narrowing at L5-S1. L4-5 and L5-S1 degenerative facet arthropathy. Minimal L3 on L4 retrolisthesis. Electronically Signed   By: Ashley Royalty M.D.   On: 04/30/2016 20:21   Mr Lumbar Spine W Wo Contrast  Result Date: 04/30/2016 CLINICAL DATA:  RIGHT back pain radiating to foot beginning this morning, worse with walking. Assess for discitis. History of diabetes, seizures, lung abscess. EXAM: MRI LUMBAR SPINE WITHOUT AND WITH CONTRAST TECHNIQUE: Multiplanar and multiecho pulse sequences of the lumbar spine were obtained without and with intravenous contrast. CONTRAST:  55mL MULTIHANCE GADOBENATE DIMEGLUMINE 529 MG/ML IV SOLN COMPARISON:  Lumbar spine radiograph April 30, 2016 at 2002 hours. MRI of the lumbar spine May 19, 2013 not available for direct comparison, report reviewed. FINDINGS: SEGMENTATION: For the purposes of this report, the last well-formed intervertebral disc will be described as L5-S1. ALIGNMENT: Maintenance of the lumbar lordosis. Minimal grade 1 L3-4 retrolisthesis, grade 1 L4-5 anterolisthesis. Chronic LEFT L4 pars interarticularis defect. VERTEBRAE:Vertebral bodies are intact. Minimal L3-4 disc narrowing, with decreased T2 signal within the disc compatible with mild desiccation. Minimal acute on chronic discogenic endplate changes at 075-GRM with endplate enhancement. Additional mild subacute discogenic endplate changes [id number], [id number]. No suspicious bone marrow signal. No abnormal osseous or intradiscal enhancement. Borderline congenital canal narrowing on the basis of foreshortened pedicles. CONUS MEDULLARIS: Conus medullaris terminates at T12-L1 and demonstrates normal morphology and signal characteristics. Cauda equina is normal. No suspicious cord, leptomeningeal or epidural enhancement. PARASPINAL AND SOFT TISSUES:  Bright STIR signal RIGHT L2-3 through L5-S1 paraspinal soft tissues without focal fluid collection. DISC LEVELS: L1-2: Annular bulging. Mild facet arthropathy. No canal stenosis or neural foraminal narrowing. L2-3: No disc bulge. Mild facet arthropathy and ligamentum flavum redundancy without canal stenosis or neural foraminal narrowing. L3-4: Retrolisthesis. 4 mm broad-based central disc protrusion. Moderate facet arthropathy and ligamentum flavum redundancy. No canal stenosis though partial effacement lateral recess could affect the traversing RIGHT greater than LEFT L4 nerves. Mild to moderate RIGHT, minimal LEFT neural foraminal narrowing. L4-5: Anterolisthesis. Small broad-based disc bulge with superimposed RIGHT central 3 x 4 mm disc extrusion with 9 mm of contiguous superior migration. Severe facet arthropathy, developmentally unfused spinous process. Complex 16 x 25 x 32 mm RIGHT facet synovial cyst extending posteriorly is likely partially calcified. Second 11 x 13 x 26 mm RIGHT facet synovial cyst extends into the paraspinal soft tissues. No canal stenosis. Severe bilateral neural foraminal narrowing slight enhancement of the RIGHT L4 nerve with T2 bright signal proximally. L5-S1: Small broad-based disc bulge. Moderate RIGHT, mild LEFT facet arthropathy. No canal stenosis. Mild to moderate bilateral neural foraminal narrowing. IMPRESSION: 1. No discitis osteomyelitis. 2. 3 x 4 x 8 mm contiguous RIGHT central L4-5 disc extrusion. 3. Grade 1 L4-5 anterolisthesis, chronic RIGHT L4 pars interarticularis and developmentally unfused spinous process. Severe RIGHT facet arthropathy and bulky synovial cysts extending into the paraspinal soft tissues. Reactive low-grade RIGHT paraspinal muscle strain. 4. No canal stenosis. Neural foraminal narrowing L3-4 through L5-S1: Severe bilaterally at L4-5 with mild RIGHT L4 neuritis. Electronically Signed   By: Elon Alas M.D.   On: 04/30/2016 22:35   Dg Hip Unilat W  Or Wo Pelvis 2-3 Views Right  Result Date: 04/30/2016 CLINICAL DATA:  Right leg pain from ankle to top of head and lower back pain. EXAM: DG HIP (WITH OR WITHOUT PELVIS) 2-3V RIGHT COMPARISON:  None. FINDINGS: There is no evidence of hip fracture or dislocation. There is slight bilateral hip joint space narrowing. Mild degenerative sclerosis of the left SI joint. Pubic rami and pubic symphysis appear intact. Degenerative facet arthropathy of L4-5 and L5-S1 facets on the right. IMPRESSION: Lower lumbar facet arthropathy on the right at L4-5 and L5-S1. Left SI joint osteoarthritis. Mild degenerative joint space narrowing of both hips without acute osseous abnormality. Electronically Signed   By: Ashley Royalty M.D.   On: 04/30/2016 20:11        Scheduled Meds: . divalproex  2,000 mg Oral BID  . enoxaparin (LOVENOX) injection  40 mg Subcutaneous Q24H  . insulin aspart  0-5 Units Subcutaneous QHS  .  insulin aspart  0-9 Units Subcutaneous TID WC  . levETIRAcetam  750 mg Oral BID  . naproxen sodium  275 mg Oral BID WC  . phenytoin  50 mg Oral QPM  . phenytoin  200 mg Oral q morning - 10a   Continuous Infusions:   LOS: 0 days        Mauricio Gerome Apley, MD [facility] Pager [phone]  If 7PM-7AM, please contact night-coverage [email or web address] Password [facility] 05/01/2016, 12:35 PM

## 2016-05-01 NOTE — ED Provider Notes (Signed)
[name] DEPT Provider Note   CSN: [id number] Arrival date & time: 04/30/16  [place]     History   Chief Complaint Chief Complaint  Patient presents with  . Hip Pain    HPI [name] is a 61 y.o. male with history of diabetes who presents with acute onset right hip and leg pain that began this morning. Patient reports throbbing and numbness and tingling that starts in his hip and radiates to his foot. Patient denies fever at home, however is febrile in the ED. Patient denies any pain elsewhere. Patient denies any known injury. Patient denies any chest pain, shortness of breath, abdominal pain, nausea, vomiting, urinary symptoms, saddle anesthesia, bowel/bladder incontinence, weight loss, history of IVDU, recent surgeries, or known cancer.  HPI  Past Medical History:  Diagnosis Date  . Blindness of right eye   . Diabetes mellitus   . Epilepsy ([place])    As reported by patient  . Seizures [facility])    since age 3    Patient Active Problem List   Diagnosis Date Noted  . Sciatica of right side 04/30/2016  . Fever 04/30/2016  . Localization-related symptomatic epilepsy and epileptic syndromes with complex partial seizures, not intractable, without status epilepticus ([place]) 01/29/2015  . Blindness of right eye   . LUNG ABSCESS 03/14/2008    History reviewed. No pertinent surgical history.     Home Medications    Prior to Admission medications   Medication Sig Start Date End Date Taking? Authorizing Provider  divalproex (DEPAKOTE ER) 500 MG 24 hr tablet Take 4 tablets (2,000 mg total) by mouth 2 (two) times daily. 02/05/16  Yes Adam Telford Nab, DO  levETIRAcetam (KEPPRA) 750 MG tablet Take 1 tablet (750 mg total) by mouth 2 (two) times daily. 02/05/16  Yes Pieter Partridge, DO  metFORMIN (GLUCOPHAGE) 500 MG tablet Take 500 mg by mouth daily with breakfast.  01/27/16  Yes Historical Provider, MD  phenytoin (DILANTIN) 100 MG ER capsule Take 2 capsules (200 mg total) by mouth every  morning. 02/05/16  Yes Adam Telford Nab, DO  phenytoin (DILANTIN) 50 MG tablet Chew 1 tablet (50 mg total) by mouth every evening. 02/05/16  Yes Pieter Partridge, DO    Family History Family History  Problem Relation Age of Onset  . Diabetes Mother   . Hypertension Mother     Social History Social History  Substance Use Topics  . Smoking status: Current Every Day Smoker    Packs/day: 0.25    Types: Cigarettes  . Smokeless tobacco: Never Used  . Alcohol use Yes     Comment: one 40 oz beer each day     Allergies   Patient has no known allergies.   Review of Systems Review of Systems  Constitutional: Positive for fever. Negative for chills.  HENT: Negative for facial swelling and sore throat.   Respiratory: Negative for shortness of breath.   Cardiovascular: Negative for chest pain.  Gastrointestinal: Negative for abdominal pain, nausea and vomiting.  Genitourinary: Negative for dysuria.  Musculoskeletal: Positive for arthralgias and back pain.  Skin: Negative for rash and wound.  Neurological: Positive for numbness. Negative for headaches.  Psychiatric/Behavioral: The patient is not nervous/anxious.      Physical Exam Updated Vital Signs BP 101/68   Pulse 84   Temp 98.8 F (37.1 C) (Oral)   Resp 16   Ht 5\' 5"  (1.651 m)   Wt 59 kg   SpO2 96%   BMI 21.63 kg/m  Physical Exam  Constitutional: He appears well-developed and well-nourished. No distress.  HENT:  Head: Normocephalic and atraumatic.  Mouth/Throat: Oropharynx is clear and moist. No oropharyngeal exudate.  Eyes: Conjunctivae are normal. Pupils are equal, round, and reactive to light. Right eye exhibits no discharge. Left eye exhibits no discharge. No scleral icterus.  Neck: Normal range of motion. Neck supple. No thyromegaly present.  Cardiovascular: Normal rate, regular rhythm, normal heart sounds and intact distal pulses.  Exam reveals no gallop and no friction rub.   No murmur heard. Pulmonary/Chest:  Effort normal and breath sounds normal. No stridor. No respiratory distress. He has no wheezes. He has no rales.  Abdominal: Soft. Bowel sounds are normal. He exhibits no distension. There is no tenderness. There is no rebound and no guarding.  Musculoskeletal: He exhibits no edema.       Right hip: He exhibits tenderness and bony tenderness.       Legs: Lymphadenopathy:    He has no cervical adenopathy.  Neurological: He is alert. Coordination normal.  R leg: Patient refusing to move leg against resistance; sensation intact, however patient reporting paresthesias 5/5 strength and normal sensation to left lower extremity  Skin: Skin is warm and dry. No rash noted. He is not diaphoretic. No pallor.  Psychiatric: He has a normal mood and affect.  Nursing note and vitals reviewed.    ED Treatments / Results  Labs (all labs ordered are listed, but only abnormal results are displayed) Labs Reviewed  CBC WITH DIFFERENTIAL/PLATELET - Abnormal; Notable for the following:       Result Value   WBC 11.8 (*)    Neutro Abs 8.5 (*)    Monocytes Absolute 1.7 (*)    All other components within normal limits  BASIC METABOLIC PANEL - Abnormal; Notable for the following:    Glucose, Bld 106 (*)    Calcium 8.8 (*)    All other components within normal limits  URINALYSIS, ROUTINE W REFLEX MICROSCOPIC - Abnormal; Notable for the following:    APPearance CLOUDY (*)    Specific Gravity, Urine 1.031 (*)    Ketones, ur 20 (*)    Protein, ur 30 (*)    Leukocytes, UA LARGE (*)    All other components within normal limits  CULTURE, BLOOD (ROUTINE X 2)  CULTURE, BLOOD (ROUTINE X 2)  URINE CULTURE  RESPIRATORY PANEL BY PCR  INFLUENZA PANEL BY PCR (TYPE A & B, H1N1)  I-STAT CREATININE, ED    EKG  EKG Interpretation None       Radiology Dg Lumbar Spine Complete  Result Date: 04/30/2016 CLINICAL DATA:  Right leg and back pain EXAM: LUMBAR SPINE - COMPLETE 4+ VIEW COMPARISON:  05/19/2013 lumbar  spine MRI report. FINDINGS: There is no evidence of lumbar spine fracture. There is mild facet arthropathy at L4-5 and L5-S1. Minimal retrolisthesis of L3 on L4. Intervertebral disc spaces are maintained with slight narrowing at L5-S1. No acute osseous abnormality nor bone destruction. Aortic atherosclerosis of the abdominal aorta and common iliacs. IMPRESSION: Slight disc space narrowing at L5-S1. L4-5 and L5-S1 degenerative facet arthropathy. Minimal L3 on L4 retrolisthesis. Electronically Signed   By: Ashley Royalty M.D.   On: 04/30/2016 20:21   Mr Lumbar Spine W Wo Contrast  Result Date: 04/30/2016 CLINICAL DATA:  RIGHT back pain radiating to foot beginning this morning, worse with walking. Assess for discitis. History of diabetes, seizures, lung abscess. EXAM: MRI LUMBAR SPINE WITHOUT AND WITH CONTRAST TECHNIQUE: Multiplanar and multiecho  pulse sequences of the lumbar spine were obtained without and with intravenous contrast. CONTRAST:  51mL MULTIHANCE GADOBENATE DIMEGLUMINE 529 MG/ML IV SOLN COMPARISON:  Lumbar spine radiograph April 30, 2016 at 2002 hours. MRI of the lumbar spine May 19, 2013 not available for direct comparison, report reviewed. FINDINGS: SEGMENTATION: For the purposes of this report, the last well-formed intervertebral disc will be described as L5-S1. ALIGNMENT: Maintenance of the lumbar lordosis. Minimal grade 1 L3-4 retrolisthesis, grade 1 L4-5 anterolisthesis. Chronic LEFT L4 pars interarticularis defect. VERTEBRAE:Vertebral bodies are intact. Minimal L3-4 disc narrowing, with decreased T2 signal within the disc compatible with mild desiccation. Minimal acute on chronic discogenic endplate changes at 075-GRM with endplate enhancement. Additional mild subacute discogenic endplate changes [id number], [id number]. No suspicious bone marrow signal. No abnormal osseous or intradiscal enhancement. Borderline congenital canal narrowing on the basis of foreshortened pedicles. CONUS MEDULLARIS: Conus  medullaris terminates at T12-L1 and demonstrates normal morphology and signal characteristics. Cauda equina is normal. No suspicious cord, leptomeningeal or epidural enhancement. PARASPINAL AND SOFT TISSUES: Bright STIR signal RIGHT L2-3 through L5-S1 paraspinal soft tissues without focal fluid collection. DISC LEVELS: L1-2: Annular bulging. Mild facet arthropathy. No canal stenosis or neural foraminal narrowing. L2-3: No disc bulge. Mild facet arthropathy and ligamentum flavum redundancy without canal stenosis or neural foraminal narrowing. L3-4: Retrolisthesis. 4 mm broad-based central disc protrusion. Moderate facet arthropathy and ligamentum flavum redundancy. No canal stenosis though partial effacement lateral recess could affect the traversing RIGHT greater than LEFT L4 nerves. Mild to moderate RIGHT, minimal LEFT neural foraminal narrowing. L4-5: Anterolisthesis. Small broad-based disc bulge with superimposed RIGHT central 3 x 4 mm disc extrusion with 9 mm of contiguous superior migration. Severe facet arthropathy, developmentally unfused spinous process. Complex 16 x 25 x 32 mm RIGHT facet synovial cyst extending posteriorly is likely partially calcified. Second 11 x 13 x 26 mm RIGHT facet synovial cyst extends into the paraspinal soft tissues. No canal stenosis. Severe bilateral neural foraminal narrowing slight enhancement of the RIGHT L4 nerve with T2 bright signal proximally. L5-S1: Small broad-based disc bulge. Moderate RIGHT, mild LEFT facet arthropathy. No canal stenosis. Mild to moderate bilateral neural foraminal narrowing. IMPRESSION: 1. No discitis osteomyelitis. 2. 3 x 4 x 8 mm contiguous RIGHT central L4-5 disc extrusion. 3. Grade 1 L4-5 anterolisthesis, chronic RIGHT L4 pars interarticularis and developmentally unfused spinous process. Severe RIGHT facet arthropathy and bulky synovial cysts extending into the paraspinal soft tissues. Reactive low-grade RIGHT paraspinal muscle strain. 4. No  canal stenosis. Neural foraminal narrowing L3-4 through L5-S1: Severe bilaterally at L4-5 with mild RIGHT L4 neuritis. Electronically Signed   By: Elon Alas M.D.   On: 04/30/2016 22:35   Dg Hip Unilat W Or Wo Pelvis 2-3 Views Right  Result Date: 04/30/2016 CLINICAL DATA:  Right leg pain from ankle to top of head and lower back pain. EXAM: DG HIP (WITH OR WITHOUT PELVIS) 2-3V RIGHT COMPARISON:  None. FINDINGS: There is no evidence of hip fracture or dislocation. There is slight bilateral hip joint space narrowing. Mild degenerative sclerosis of the left SI joint. Pubic rami and pubic symphysis appear intact. Degenerative facet arthropathy of L4-5 and L5-S1 facets on the right. IMPRESSION: Lower lumbar facet arthropathy on the right at L4-5 and L5-S1. Left SI joint osteoarthritis. Mild degenerative joint space narrowing of both hips without acute osseous abnormality. Electronically Signed   By: Ashley Royalty M.D.   On: 04/30/2016 20:11    Procedures Procedures (including critical care time)  Medications  Ordered in ED Medications  ketorolac (TORADOL) injection 60 mg (60 mg Intramuscular Given 04/30/16 2012)  gadobenate dimeglumine (MULTIHANCE) injection 12 mL (19 mLs Intravenous Contrast Given 04/30/16 2202)  morphine 4 MG/ML injection 4 mg (4 mg Intravenous Given 04/30/16 2314)     Initial Impression / Assessment and Plan / ED Course  I have reviewed the triage vital signs and the nursing notes.  Pertinent labs & imaging results that were available during my care of the patient were reviewed by me and considered in my medical decision making (see chart for details).  Clinical Course    CBC shows WBC 11.8. BMP unremarkable. UA shows large leukocytes. Urine culture sent. MR of the lumbar spine shows no discitis osteomyelitis; 3 x 4 x 8 mm contiguous right central L4-L5 disc extrusion; grade 1 L4-5 anterolisthesis, chronic right L4 pars inter articularis and developmentally unfused spinous  process; severe right facet arthropathy and bulky synovial cyst extending into the paraspinal soft tissues; reactive low-grade right paraspinal muscle strain; no canal stenosis; neural foraminal narrowing L3-4 through L5-S1; severe bilaterally at L4-5 with mild right L4 neuritis. Patient also evaluated by Dr. Winfred Leeds who would like to admit patient due to febrile and high risk. Dr. Winfred Leeds consulted with Dr. Loleta Books who will admit patient for overnight stay.   Final Clinical Impressions(s) / ED Diagnoses   Final diagnoses:  Right leg pain  Lumbar back pain  Lumbar radiculopathy, acute  Cough    New Prescriptions New Prescriptions   No medications on file     Frederica Kuster, PA-C 05/01/16 [place], MD 05/01/16 0145

## 2016-05-01 NOTE — Evaluation (Signed)
Physical Therapy Evaluation Patient Details Name: [name] MRN: [id number] DOB: 14-Jul-1955 Today's Date: 05/01/2016   History of Present Illness  61 yo male admitted with R side sciatica, inability to ambulate due to pain. Imaging showed R L4-L5 disc extrusion, neuritis. Hx of Sz, sciatica (injections by ortho MD per pt), blindness.  Clinical Impression  On eval, pt required Min assist for mobility. He walked ~20 feet with a RW. Pain rated 7/10 R LE (mostly hip area). Pt tolerated activity fairly well although he remains limited by pain. Recommend HHPT and RW for ambulation. Male visitor in room during eval who stated he was pt's aide. Will follow and progress activity as tolerated.     Follow Up Recommendations Home health PT;Supervision - Intermittent    Equipment Recommendations  Rolling walker with 5" wheels    Recommendations for Other Services OT consult     Precautions / Restrictions Precautions Precautions: Fall; droplet Restrictions Weight Bearing Restrictions: No      Mobility  Bed Mobility Overal bed mobility: Needs Assistance Bed Mobility: Rolling;Sidelying to Sit;Sit to Supine Rolling: Min guard Sidelying to sit: Min guard   Sit to supine: Min assist   General bed mobility comments: Assist for R LE. Very close guarding. Cues for safety, technique.   Transfers Overall transfer level: Needs assistance Equipment used: Rolling walker (2 wheeled) Transfers: Sit to/from Stand Sit to Stand: Min assist;From elevated surface         General transfer comment: Assist to rise, stabilize, control descent. VCs hand placement  Ambulation/Gait Ambulation/Gait assistance: Min guard Ambulation Distance (Feet): 20 Feet Assistive device: Rolling walker (2 wheeled) Gait Pattern/deviations: Step-to pattern;Step-through pattern     General Gait Details: very close guarding. VCs safety, distance from RW. Distance limited by pain  Stairs            Wheelchair  Mobility    Modified Rankin (Stroke Patients Only)       Balance                                             Pertinent Vitals/Pain Pain Assessment: 0-10 Pain Score: 7  Pain Location: R LE (hip to foot). R lower back    Home Living Family/patient expects to be discharged to:: Private residence Living Arrangements: Alone Available Help at Discharge: Personal care attendant (7x/week, several hours (per pt and male visitor in room who stated he was the aide) Type of Home: Apartment Home Access: Stairs to enter Entrance Stairs-Rails: Right Entrance Stairs-Number of Steps: 2 Home Layout: One level Home Equipment: Cane - single point      Prior Function Level of Independence: Independent               Hand Dominance        Extremity/Trunk Assessment   Upper Extremity Assessment Upper Extremity Assessment: Overall WFL for tasks assessed    Lower Extremity Assessment Lower Extremity Assessment: RLE deficits/detail RLE Deficits / Details: Difficulty flexing hip and knee in supine and durng swing phase of gait RLE: Unable to fully assess due to pain    Cervical / Trunk Assessment Cervical / Trunk Assessment: Normal  Communication   Communication: No difficulties  Cognition Arousal/Alertness: Awake/alert Behavior During Therapy: WFL for tasks assessed/performed Overall Cognitive Status: Within Functional Limits for tasks assessed  General Comments      Exercises     Assessment/Plan    PT Assessment Patient needs continued PT services  PT Problem List Decreased strength;Decreased mobility;Decreased range of motion;Decreased activity tolerance;Decreased balance;Decreased knowledge of use of DME;Pain          PT Treatment Interventions DME instruction;Gait training;Therapeutic activities;Therapeutic exercise;Patient/family education;Functional mobility training;Balance training    PT Goals (Current goals can  be found in the Care Plan section)  Acute Rehab PT Goals Patient Stated Goal: less pain PT Goal Formulation: With patient Time For Goal Achievement: 05/15/16 Potential to Achieve Goals: Good    Frequency Min 3X/week   Barriers to discharge Decreased caregiver support      Co-evaluation               End of Session Equipment Utilized During Treatment: Gait belt Activity Tolerance: Patient limited by pain Patient left: in bed;with call bell/phone within reach;with family/visitor present;with bed alarm set      Functional Assessment Tool Used: clinical judgement Functional Limitation: Mobility: Walking and moving around Mobility: Walking and Moving Around Current Status [id number]): At least 1 percent but less than 20 percent impaired, limited or restricted Mobility: Walking and Moving Around Goal Status [phone]): At least 1 percent but less than 20 percent impaired, limited or restricted    Time: 1040-1052 PT Time Calculation (min) (ACUTE ONLY): 12 min   Charges:   PT Evaluation $PT Eval Low Complexity: 1 Procedure     PT G Codes:   PT G-Codes **NOT FOR INPATIENT CLASS** Functional Assessment Tool Used: clinical judgement Functional Limitation: Mobility: Walking and moving around Mobility: Walking and Moving Around Current Status [id number]): At least 1 percent but less than 20 percent impaired, limited or restricted Mobility: Walking and Moving Around Goal Status [phone]): At least 1 percent but less than 20 percent impaired, limited or restricted    Weston Anna, MPT Pager: [phone]

## 2016-05-01 NOTE — Progress Notes (Signed)
Spoke with patient at bedside, initially very agitated at my presence and asking questions. States he has an aide full time, he works with patient on exercises, declines need for [facility]. States he has all DME he needs, declines walker. Will continue to follow for d/c needs.

## 2016-05-01 NOTE — Progress Notes (Signed)
Patient given ABN, choose option 1. Plans for d/c in am.

## 2016-05-01 NOTE — ED Notes (Signed)
Pt. In xray 

## 2016-05-02 DIAGNOSIS — M5431 Sciatica, right side: Secondary | ICD-10-CM | POA: Diagnosis not present

## 2016-05-02 DIAGNOSIS — R569 Unspecified convulsions: Secondary | ICD-10-CM | POA: Diagnosis not present

## 2016-05-02 DIAGNOSIS — R262 Difficulty in walking, not elsewhere classified: Secondary | ICD-10-CM

## 2016-05-02 DIAGNOSIS — R509 Fever, unspecified: Secondary | ICD-10-CM | POA: Diagnosis not present

## 2016-05-02 DIAGNOSIS — M5116 Intervertebral disc disorders with radiculopathy, lumbar region: Secondary | ICD-10-CM | POA: Diagnosis not present

## 2016-05-02 LAB — URINE CULTURE

## 2016-05-02 LAB — GLUCOSE, CAPILLARY
GLUCOSE-CAPILLARY: 107 mg/dL — AB (ref 65–99)
Glucose-Capillary: 144 mg/dL — ABNORMAL HIGH (ref 65–99)

## 2016-05-02 MED ORDER — NAPROXEN SODIUM 275 MG PO TABS: 275.0000 mg | ORAL_TABLET | Freq: Two times a day (BID) | ORAL | 0 refills | Status: DC | PRN

## 2016-05-02 MED ORDER — CYCLOBENZAPRINE HCL 5 MG PO TABS: 5.0000 mg | ORAL_TABLET | Freq: Three times a day (TID) | ORAL | 0 refills | Status: DC | PRN

## 2016-05-02 MED ORDER — ACETAMINOPHEN 325 MG PO TABS: 650.0000 mg | ORAL_TABLET | Freq: Four times a day (QID) | ORAL | 0 refills | Status: DC | PRN

## 2016-05-02 NOTE — Progress Notes (Signed)
Pt IV removed. VSS. AVS and follow up care reviewed at bedside w/ family present. No further questions. Pt walked out w/ family for d/c.  Kizzie Ide, RN

## 2016-05-02 NOTE — Discharge Summary (Signed)
Physician Discharge Summary  [name] [id number] DOB: July 30, 1955 DOA: 04/30/2016  PCP: Burman Riis, NP  Admit date: 04/30/2016 Discharge date: 05/02/2016  Admitted From:  Home  Disposition:  Home   Recommendations for Outpatient Follow-up:  1. Follow up with PCP in 1- weeks   Home Health: yes Equipment/Devices: rolling walker  Discharge Condition: Stable   CODE STATUS: Full   Diet recommendation: regular  Brief/Interim Summary: This is a 61 year old male who presented to the hospital with the chief complaint of right leg pain. He developed sudden acute pain on his right leg and right gluteal region radiated down to his right foot and associated with weakness and inability to walk due to excruciating pain. His symptoms started after him taking a nap. On initial physical examination his temperature was 101.7, heart rate 87, respiratory rate 16, oxygen saturation 96%. His oral mucosa was moist, his neck was supple, his lungs were clear to auscultation bilaterally, heart S1-S2 present rhythmic, his abdomen was soft, noted pain with movement of the right leg, 4 out of 5 strength, movement limited due to pain. Sodium 137, potassium 3.9, chloride 103, bicarbonate 24, BUN 19, creatinine 0.80, white count 11.8, hemoglobin 13.9, hematocrit 40.5, platelets 152. Urinalysis with large leukocyte esterase, white cells 6-30, negative nitrates. Lumbar x-ray shows slight disc space narrowing at L5/S1. L4/5 and L5/S1 degenerative arthropathy. Minimal L3 and L4 retrolisthesis. Chest x-ray hypoinflated with no infiltrates. MRI of the lumbar spine show no discitis/osteomyelitis, 348 mm contiguous right central L4/5 disc extrusion, grade 1 L4/5 anterolisthesis, severe right face arthropathy and bulky synovial cyst extending into the paraspinal soft tissues, reactive low-grade right paraspinal muscle strain, no canal stenosis, neural foraminal narrowing L3/4 through L5/S1, severe bilaterally at L4/5  with mild right L4 neuritis.   The patient was admitted to the hospital with the working diagnosis of low back and right leg pain due to disc disease and neuritis, complicated by fever.  1. Lumbar disc disease and L4 neuritis. Patient received supportive analgesic and anti-inflammatory regimen with acetaminophen, ketorolac and tramadol. Flexeril was added for muscle relaxant therapy. Patient's symptoms improved, increased mobility and decreased pain, physical therapy evaluation, recommendations for home physical therapy, rolling walker.  2. Fever. No infectious processes found, no antibiotics prescribed, MRI rule out discitis or osteomyelitis.  3. Type 2 diabetes mellitus. Serum glucose remained controlled, patient will resume metformin by the time of discharge.  4. Seizures. Patient's seizure regimen was continue with phenytoin, Keppra and Depakote. No active seizure noted.   Discharge Diagnoses:  Principal Problem:   Sciatica of right side Active Problems:   Blindness of right eye   Localization-related symptomatic epilepsy and epileptic syndromes with complex partial seizures, not intractable, without status epilepticus ([place])   Fever    Discharge Instructions   Allergies as of 05/02/2016   No Known Allergies     Medication List    TAKE these medications   acetaminophen 325 MG tablet Commonly known as:  TYLENOL Take 2 tablets (650 mg total) by mouth every 6 (six) hours as needed for mild pain.   cyclobenzaprine 5 MG tablet Commonly known as:  FLEXERIL Take 1 tablet (5 mg total) by mouth 3 (three) times daily as needed for muscle spasms.   divalproex 500 MG 24 hr tablet Commonly known as:  DEPAKOTE ER Take 4 tablets (2,000 mg total) by mouth 2 (two) times daily.   levETIRAcetam 750 MG tablet Commonly known as:  KEPPRA Take 1 tablet (750 mg total)  by mouth 2 (two) times daily.   metFORMIN 500 MG tablet Commonly known as:  GLUCOPHAGE Take 500 mg by mouth daily with  breakfast.   naproxen sodium 275 MG tablet Commonly known as:  ANAPROX Take 1 tablet (275 mg total) by mouth 2 (two) times daily as needed for moderate pain.   phenytoin 100 MG ER capsule Commonly known as:  DILANTIN Take 2 capsules (200 mg total) by mouth every morning.   phenytoin 50 MG tablet Commonly known as:  DILANTIN Chew 1 tablet (50 mg total) by mouth every evening.       No Known Allergies  Consultations:     Procedures/Studies: Dg Chest 2 View  Result Date: 05/01/2016 CLINICAL DATA:  Productive cough for 3 or 4 days. EXAM: CHEST  2 VIEW COMPARISON:  04/06/2011 FINDINGS: The heart size and mediastinal contours are within normal limits. Both lungs are clear. The visualized skeletal structures are unremarkable. IMPRESSION: No active cardiopulmonary disease. Electronically Signed   By: Andreas Newport M.D.   On: 05/01/2016 01:01   Dg Lumbar Spine Complete  Result Date: 04/30/2016 CLINICAL DATA:  Right leg and back pain EXAM: LUMBAR SPINE - COMPLETE 4+ VIEW COMPARISON:  05/19/2013 lumbar spine MRI report. FINDINGS: There is no evidence of lumbar spine fracture. There is mild facet arthropathy at L4-5 and L5-S1. Minimal retrolisthesis of L3 on L4. Intervertebral disc spaces are maintained with slight narrowing at L5-S1. No acute osseous abnormality nor bone destruction. Aortic atherosclerosis of the abdominal aorta and common iliacs. IMPRESSION: Slight disc space narrowing at L5-S1. L4-5 and L5-S1 degenerative facet arthropathy. Minimal L3 on L4 retrolisthesis. Electronically Signed   By: Ashley Royalty M.D.   On: 04/30/2016 20:21   Mr Lumbar Spine W Wo Contrast  Result Date: 04/30/2016 CLINICAL DATA:  RIGHT back pain radiating to foot beginning this morning, worse with walking. Assess for discitis. History of diabetes, seizures, lung abscess. EXAM: MRI LUMBAR SPINE WITHOUT AND WITH CONTRAST TECHNIQUE: Multiplanar and multiecho pulse sequences of the lumbar spine were obtained  without and with intravenous contrast. CONTRAST:  19mL MULTIHANCE GADOBENATE DIMEGLUMINE 529 MG/ML IV SOLN COMPARISON:  Lumbar spine radiograph April 30, 2016 at 2002 hours. MRI of the lumbar spine May 19, 2013 not available for direct comparison, report reviewed. FINDINGS: SEGMENTATION: For the purposes of this report, the last well-formed intervertebral disc will be described as L5-S1. ALIGNMENT: Maintenance of the lumbar lordosis. Minimal grade 1 L3-4 retrolisthesis, grade 1 L4-5 anterolisthesis. Chronic LEFT L4 pars interarticularis defect. VERTEBRAE:Vertebral bodies are intact. Minimal L3-4 disc narrowing, with decreased T2 signal within the disc compatible with mild desiccation. Minimal acute on chronic discogenic endplate changes at 075-GRM with endplate enhancement. Additional mild subacute discogenic endplate changes [id number], [id number]. No suspicious bone marrow signal. No abnormal osseous or intradiscal enhancement. Borderline congenital canal narrowing on the basis of foreshortened pedicles. CONUS MEDULLARIS: Conus medullaris terminates at T12-L1 and demonstrates normal morphology and signal characteristics. Cauda equina is normal. No suspicious cord, leptomeningeal or epidural enhancement. PARASPINAL AND SOFT TISSUES: Bright STIR signal RIGHT L2-3 through L5-S1 paraspinal soft tissues without focal fluid collection. DISC LEVELS: L1-2: Annular bulging. Mild facet arthropathy. No canal stenosis or neural foraminal narrowing. L2-3: No disc bulge. Mild facet arthropathy and ligamentum flavum redundancy without canal stenosis or neural foraminal narrowing. L3-4: Retrolisthesis. 4 mm broad-based central disc protrusion. Moderate facet arthropathy and ligamentum flavum redundancy. No canal stenosis though partial effacement lateral recess could affect the traversing RIGHT greater than LEFT L4 nerves.  Mild to moderate RIGHT, minimal LEFT neural foraminal narrowing. L4-5: Anterolisthesis. Small broad-based disc bulge  with superimposed RIGHT central 3 x 4 mm disc extrusion with 9 mm of contiguous superior migration. Severe facet arthropathy, developmentally unfused spinous process. Complex 16 x 25 x 32 mm RIGHT facet synovial cyst extending posteriorly is likely partially calcified. Second 11 x 13 x 26 mm RIGHT facet synovial cyst extends into the paraspinal soft tissues. No canal stenosis. Severe bilateral neural foraminal narrowing slight enhancement of the RIGHT L4 nerve with T2 bright signal proximally. L5-S1: Small broad-based disc bulge. Moderate RIGHT, mild LEFT facet arthropathy. No canal stenosis. Mild to moderate bilateral neural foraminal narrowing. IMPRESSION: 1. No discitis osteomyelitis. 2. 3 x 4 x 8 mm contiguous RIGHT central L4-5 disc extrusion. 3. Grade 1 L4-5 anterolisthesis, chronic RIGHT L4 pars interarticularis and developmentally unfused spinous process. Severe RIGHT facet arthropathy and bulky synovial cysts extending into the paraspinal soft tissues. Reactive low-grade RIGHT paraspinal muscle strain. 4. No canal stenosis. Neural foraminal narrowing L3-4 through L5-S1: Severe bilaterally at L4-5 with mild RIGHT L4 neuritis. Electronically Signed   By: Elon Alas M.D.   On: 04/30/2016 22:35   Dg Hip Unilat W Or Wo Pelvis 2-3 Views Right  Result Date: 04/30/2016 CLINICAL DATA:  Right leg pain from ankle to top of head and lower back pain. EXAM: DG HIP (WITH OR WITHOUT PELVIS) 2-3V RIGHT COMPARISON:  None. FINDINGS: There is no evidence of hip fracture or dislocation. There is slight bilateral hip joint space narrowing. Mild degenerative sclerosis of the left SI joint. Pubic rami and pubic symphysis appear intact. Degenerative facet arthropathy of L4-5 and L5-S1 facets on the right. IMPRESSION: Lower lumbar facet arthropathy on the right at L4-5 and L5-S1. Left SI joint osteoarthritis. Mild degenerative joint space narrowing of both hips without acute osseous abnormality. Electronically Signed   By:  Ashley Royalty M.D.   On: 04/30/2016 20:11       Subjective: Patient feeling better, pain and mobility have improved, no chest pain or dyspnea, no nausea or vomiting.   Discharge Exam: Vitals:   05/01/16 2132 05/02/16 0558  BP: 112/87 129/70  Pulse: 88 78  Resp: 18 17  Temp: 98.7 F (37.1 C) 97.8 F (36.6 C)   Vitals:   05/01/16 0345 05/01/16 1436 05/01/16 2132 05/02/16 0558  BP: 136/77 111/75 112/87 129/70  Pulse: 70 77 88 78  Resp: 16 16 18 17   Temp: 98.7 F (37.1 C) 98.6 F (37 C) 98.7 F (37.1 C) 97.8 F (36.6 C)  TempSrc: Oral Oral Oral Oral  SpO2: 100% 97% 98% 100%  Weight: 60.4 kg (133 lb 2.5 oz)     Height: 5\' 5"  (1.651 m)       General: Pt is alert, awake, not in acute distress Cardiovascular: RRR, S1/S2 +, no rubs, no gallops Respiratory: CTA bilaterally, no wheezing, no rhonchi Abdominal: Soft, NT, ND, bowel sounds + Extremities: no edema, no cyanosis. Mild pain on the right flank    The results of significant diagnostics from this hospitalization (including imaging, microbiology, ancillary and laboratory) are listed below for reference.     Microbiology: Recent Results (from the past 240 hour(s))  Blood culture (routine x 2)     Status: None (Preliminary result)   Collection Time: 04/30/16  8:55 PM  Result Value Ref Range Status   Specimen Description BLOOD RIGHT FOREARM  Final   Special Requests BOTTLES DRAWN AEROBIC AND ANAEROBIC 5CC  Final  Culture   Final    NO GROWTH 2 DAYS Performed at [facility]    Report Status PENDING  Incomplete  Respiratory Panel by PCR     Status: Abnormal   Collection Time: 04/30/16 11:29 PM  Result Value Ref Range Status   Adenovirus NOT DETECTED NOT DETECTED Final   Coronavirus 229E NOT DETECTED NOT DETECTED Final   Coronavirus HKU1 NOT DETECTED NOT DETECTED Final   Coronavirus NL63 NOT DETECTED NOT DETECTED Final   Coronavirus OC43 DETECTED (A) NOT DETECTED Final   Metapneumovirus NOT DETECTED NOT  DETECTED Final   Rhinovirus / Enterovirus NOT DETECTED NOT DETECTED Final   Influenza A NOT DETECTED NOT DETECTED Final   Influenza B NOT DETECTED NOT DETECTED Final   Parainfluenza Virus 1 NOT DETECTED NOT DETECTED Final   Parainfluenza Virus 2 NOT DETECTED NOT DETECTED Final   Parainfluenza Virus 3 NOT DETECTED NOT DETECTED Final   Parainfluenza Virus 4 NOT DETECTED NOT DETECTED Final   Respiratory Syncytial Virus NOT DETECTED NOT DETECTED Final   Bordetella pertussis NOT DETECTED NOT DETECTED Final   Chlamydophila pneumoniae NOT DETECTED NOT DETECTED Final   Mycoplasma pneumoniae NOT DETECTED NOT DETECTED Final    Comment: Performed at [place]: BNP (last 3 results) No results for input(s): BNP in the last 8760 hours. Basic Metabolic Panel:  Recent Labs Lab 04/30/16 2050 04/30/16 2132  NA 137  --   K 3.9  --   CL 103  --   CO2 24  --   GLUCOSE 106*  --   BUN 19  --   CREATININE 0.80 0.90  CALCIUM 8.8*  --    Liver Function Tests: No results for input(s): AST, ALT, ALKPHOS, BILITOT, PROT, ALBUMIN in the last 168 hours. No results for input(s): LIPASE, AMYLASE in the last 168 hours. No results for input(s): AMMONIA in the last 168 hours. CBC:  Recent Labs Lab 04/30/16 2050 05/01/16 0606  WBC 11.8* 11.7*  NEUTROABS 8.5*  --   HGB 13.9 13.8  HCT 40.5 40.4  MCV 87.9 89.6  PLT 252 224   Cardiac Enzymes: No results for input(s): CKTOTAL, CKMB, CKMBINDEX, TROPONINI in the last 168 hours. BNP: Invalid input(s): POCBNP CBG:  Recent Labs Lab 05/01/16 1212 05/01/16 1658 05/01/16 2129 05/02/16 0756 05/02/16 1153  GLUCAP 82 111* 74 144* 107*   D-Dimer No results for input(s): DDIMER in the last 72 hours. Hgb A1c No results for input(s): HGBA1C in the last 72 hours. Lipid Profile No results for input(s): CHOL, HDL, LDLCALC, TRIG, CHOLHDL, LDLDIRECT in the last 72 hours. Thyroid function studies No results for input(s): TSH, T4TOTAL,  T3FREE, THYROIDAB in the last 72 hours.  Invalid input(s): FREET3 Anemia work up No results for input(s): VITAMINB12, FOLATE, FERRITIN, TIBC, IRON, RETICCTPCT in the last 72 hours. Urinalysis    Component Value Date/Time   COLORURINE YELLOW 04/30/2016 2050   APPEARANCEUR CLOUDY (A) 04/30/2016 2050   LABSPEC 1.031 (H) 04/30/2016 2050   PHURINE 5.0 04/30/2016 2050   GLUCOSEU NEGATIVE 04/30/2016 2050   [place] NEGATIVE 04/30/2016 2050   BILIRUBINUR NEGATIVE 04/30/2016 2050   KETONESUR 20 (A) 04/30/2016 2050   PROTEINUR 30 (A) 04/30/2016 2050   UROBILINOGEN 0.2 11/03/2013 1116   NITRITE NEGATIVE 04/30/2016 2050   LEUKOCYTESUR LARGE (A) 04/30/2016 2050   Sepsis Labs Invalid input(s): PROCALCITONIN,  WBC,  LACTICIDVEN Microbiology Recent Results (from the past 240 hour(s))  Blood culture (routine x 2)  Status: None (Preliminary result)   Collection Time: 04/30/16  8:55 PM  Result Value Ref Range Status   Specimen Description BLOOD RIGHT FOREARM  Final   Special Requests BOTTLES DRAWN AEROBIC AND ANAEROBIC 5CC  Final   Culture   Final    NO GROWTH 2 DAYS Performed at [facility]    Report Status PENDING  Incomplete  Respiratory Panel by PCR     Status: Abnormal   Collection Time: 04/30/16 11:29 PM  Result Value Ref Range Status   Adenovirus NOT DETECTED NOT DETECTED Final   Coronavirus 229E NOT DETECTED NOT DETECTED Final   Coronavirus HKU1 NOT DETECTED NOT DETECTED Final   Coronavirus NL63 NOT DETECTED NOT DETECTED Final   Coronavirus OC43 DETECTED (A) NOT DETECTED Final   Metapneumovirus NOT DETECTED NOT DETECTED Final   Rhinovirus / Enterovirus NOT DETECTED NOT DETECTED Final   Influenza A NOT DETECTED NOT DETECTED Final   Influenza B NOT DETECTED NOT DETECTED Final   Parainfluenza Virus 1 NOT DETECTED NOT DETECTED Final   Parainfluenza Virus 2 NOT DETECTED NOT DETECTED Final   Parainfluenza Virus 3 NOT DETECTED NOT DETECTED Final   Parainfluenza Virus 4 NOT  DETECTED NOT DETECTED Final   Respiratory Syncytial Virus NOT DETECTED NOT DETECTED Final   Bordetella pertussis NOT DETECTED NOT DETECTED Final   Chlamydophila pneumoniae NOT DETECTED NOT DETECTED Final   Mycoplasma pneumoniae NOT DETECTED NOT DETECTED Final    Comment: Performed at [facility]     Time coordinating discharge:  45 minutes  SIGNED:   Tawni Millers, MD  [facility] 05/02/2016, 11:55 AM Pager   If 7PM-7AM, please contact night-coverage [email or web address] Password [email or web address]

## 2016-05-02 NOTE — Care Management Obs Status (Signed)
[place] NOTIFICATION   Patient Details  Name: LUDWIN QUICKLE MRN: [id number] Date of Birth: 09-06-1955   Medicare Observation Status Notification Given:  Yes    Erenest Rasher, RN 05/02/2016, 11:21 AM

## 2016-05-02 NOTE — Care Management Note (Signed)
Case Management Note  Patient Details  Name: [name] MRN: [id number] Date of Birth: Sep 20, 1955  Subjective/Objective:    Lumbar disc disease and L4 Neuritis                Action/Plan: Discharge Planning: AVS reviewed NCM spoke to pt and offered choice for HH/list provided. Pt agreeable to [facility] for [facility] PT. States he did not want RW. Contacted [facility] Liaison for [facility] PT. Pt states he has aide that comes everyday from [place] for 1-2 hours per day.   PCP Collins [place] A MD Expected Discharge Date:  05/02/2016               Expected Discharge Plan:  [place]  In-House Referral:  NA  Discharge planning Services  CM Consult  Post Acute Care Choice:  Home Health Choice offered to:  Patient  DME Arranged:  N/A DME Agency:  NA  HH Arranged:  PT [place] Agency:  [place]  Status of Service:  Completed, signed off  If discussed at [place] of Stay Meetings, dates discussed:    Additional Comments:  Erenest Rasher, RN 05/02/2016, 12:44 PM

## 2016-05-05 LAB — CULTURE, BLOOD (ROUTINE X 2): CULTURE: NO GROWTH

## 2016-07-06 ENCOUNTER — Other Ambulatory Visit: Payer: Self-pay | Admitting: Neurology

## 2016-07-14 ENCOUNTER — Ambulatory Visit: Payer: Medicare Other | Admitting: Neurology

## 2016-07-28 ENCOUNTER — Ambulatory Visit: Payer: Medicare Other | Admitting: Neurology

## 2016-08-21 ENCOUNTER — Other Ambulatory Visit: Payer: Self-pay | Admitting: Neurology

## 2016-09-18 ENCOUNTER — Other Ambulatory Visit: Payer: Self-pay | Admitting: Neurology

## 2016-09-22 NOTE — Telephone Encounter (Signed)
Dr Tomi Likens, Can patient have this dilantin refil? Request from pharmacy Looks like its been 6 months since he has been seen and does not have a follow up

## 2016-09-22 NOTE — Telephone Encounter (Signed)
We can refill it, but we need to tell him that he must make a follow up appointment for further refills.

## 2016-10-14 ENCOUNTER — Emergency Department (HOSPITAL_COMMUNITY)
Admission: EM | Admit: 2016-10-14 | Discharge: 2016-10-14 | Disposition: A | Discharge status: Home / Self Care | Payer: Medicare Other | Attending: Emergency Medicine | Admitting: Emergency Medicine

## 2016-10-14 ENCOUNTER — Encounter (HOSPITAL_COMMUNITY): Payer: Self-pay

## 2016-10-14 DIAGNOSIS — Z7984 Long term (current) use of oral hypoglycemic drugs: Secondary | ICD-10-CM | POA: Insufficient documentation

## 2016-10-14 DIAGNOSIS — E119 Type 2 diabetes mellitus without complications: Secondary | ICD-10-CM | POA: Diagnosis not present

## 2016-10-14 DIAGNOSIS — L0231 Cutaneous abscess of buttock: Secondary | ICD-10-CM | POA: Insufficient documentation

## 2016-10-14 DIAGNOSIS — F17210 Nicotine dependence, cigarettes, uncomplicated: Secondary | ICD-10-CM | POA: Insufficient documentation

## 2016-10-14 DIAGNOSIS — L0291 Cutaneous abscess, unspecified: Secondary | ICD-10-CM | POA: Diagnosis present

## 2016-10-14 MED ORDER — LIDOCAINE-EPINEPHRINE 1 %-1:100000 IJ SOLN
20.0000 mL | Freq: Once | INTRAMUSCULAR | Status: DC
  Filled 2016-10-14: qty 20

## 2016-10-14 MED ORDER — SULFAMETHOXAZOLE-TRIMETHOPRIM 800-160 MG PO TABS: 1.0000 | ORAL_TABLET | Freq: Two times a day (BID) | ORAL | 0 refills | Status: AC

## 2016-10-14 NOTE — ED Notes (Signed)
Patient noticed one about month that had burst so thought was better.  But come back and noticed pus on toliet paper this morning when he wiped.

## 2016-10-14 NOTE — ED Notes (Signed)
Patient refused discharge vitals, stating his aude was waiting on him

## 2016-10-14 NOTE — ED Provider Notes (Signed)
[place] DEPT Provider Note   CSN: [id number] Arrival date & time: 10/14/16  1158     History   Chief Complaint Chief Complaint  Patient presents with  . Abscess    HPI [name] is a 61 y.o. male.  61 yo M with a chief complaint of painful lump near his rectum. Patient has had these multiple times. Usually drain on their own. This time he felt like he is having trouble getting it to drain. Deny fevers or chills. Denies nausea or vomiting.   The history is provided by the patient.  Abscess  Location:  Pelvis Pelvic abscess location:  Anus Abscess quality: induration and painful   Abscess quality: no fluctuance   Duration:  1 week Progression:  Partially resolved Pain details:    Quality:  Aching and burning   Severity:  Moderate   Duration:  1 week   Timing:  Constant   Progression:  Partially resolved Chronicity:  Recurrent Relieved by:  Nothing Worsened by:  Nothing Ineffective treatments:  None tried Associated symptoms: no fever, no headaches and no vomiting     Past Medical History:  Diagnosis Date  . Blindness of right eye   . Diabetes mellitus   . Epilepsy ([place])    As reported by patient  . Seizures [facility])    since age 30    Patient Active Problem List   Diagnosis Date Noted  . Sciatica of right side 04/30/2016  . Fever 04/30/2016  . Localization-related symptomatic epilepsy and epileptic syndromes with complex partial seizures, not intractable, without status epilepticus ([place]) 01/29/2015  . Blindness of right eye   . LUNG ABSCESS 03/14/2008    No past surgical history on file.     Home Medications    Prior to Admission medications   Medication Sig Start Date End Date Taking? Authorizing Provider  acetaminophen (TYLENOL) 325 MG tablet Take 2 tablets (650 mg total) by mouth every 6 (six) hours as needed for mild pain. 05/02/16  Yes Arrien, Jimmy Picket, MD  divalproex (DEPAKOTE ER) 500 MG 24 hr tablet Take 4 tablets (2,000 mg  total) by mouth 2 (two) times daily. 02/05/16  Yes Jaffe, Adam R, DO  levETIRAcetam (KEPPRA) 750 MG tablet TAKE 1 TABLET BY MOUTH TWICE A DAY 07/06/16  Yes Tomi Likens, Adam R, DO  metFORMIN (GLUCOPHAGE) 500 MG tablet Take 500 mg by mouth daily with breakfast.  01/27/16  Yes [provider]  phenytoin (DILANTIN) 100 MG ER capsule Take 2 capsules (200 mg total) by mouth every morning. 08/21/16  Yes Pieter Partridge, DO  phenytoin (DILANTIN) 50 MG tablet Chew 1 tablet (50 mg total) by mouth every evening. 09/22/16  Yes Jaffe, Adam R, DO  cyclobenzaprine (FLEXERIL) 5 MG tablet Take 1 tablet (5 mg total) by mouth 3 (three) times daily as needed for muscle spasms. Patient not taking: Reported on 10/14/2016 05/02/16   Arrien, Jimmy Picket, MD  naproxen sodium (ANAPROX) 275 MG tablet Take 1 tablet (275 mg total) by mouth 2 (two) times daily as needed for moderate pain. Patient not taking: Reported on 10/14/2016 05/02/16   Arrien, Jimmy Picket, MD  sulfamethoxazole-trimethoprim (BACTRIM DS,SEPTRA DS) 800-160 MG tablet Take 1 tablet by mouth 2 (two) times daily. 10/14/16 10/21/16  Deno Etienne, DO    Family History Family History  Problem Relation Age of Onset  . Diabetes Mother   . Hypertension Mother     Social History Social History  Substance Use Topics  . Smoking  status: Current Every Day Smoker    Packs/day: 0.25    Types: Cigarettes  . Smokeless tobacco: Never Used  . Alcohol use Yes     Comment: one 40 oz beer each day     Allergies   Patient has no known allergies.   Review of Systems Review of Systems  Constitutional: Negative for chills and fever.  HENT: Negative for congestion and facial swelling.   Eyes: Negative for discharge and visual disturbance.  Respiratory: Negative for shortness of breath.   Cardiovascular: Negative for chest pain and palpitations.  Gastrointestinal: Negative for abdominal pain, diarrhea and vomiting.  Musculoskeletal: Negative for arthralgias and  myalgias.  Skin: Positive for color change and wound. Negative for rash.  Neurological: Negative for tremors, syncope and headaches.  Psychiatric/Behavioral: Negative for confusion and dysphoric mood.     Physical Exam Updated Vital Signs BP (!) 130/94 (BP Location: Right Arm)   Pulse 78   Temp 98 F (36.7 C) (Oral)   Resp 16   Ht 5\' 5"  (1.651 m)   Wt 59 kg (130 lb)   SpO2 99%   BMI 21.63 kg/m   Physical Exam  Constitutional: He is oriented to person, place, and time. He appears well-developed and well-nourished.  HENT:  Head: Normocephalic and atraumatic.  Eyes: EOM are normal. Pupils are equal, round, and reactive to light.  Neck: Normal range of motion. Neck supple. No JVD present.  Cardiovascular: Normal rate and regular rhythm.  Exam reveals no gallop and no friction rub.   No murmur heard. Pulmonary/Chest: No respiratory distress. He has no wheezes.  Abdominal: He exhibits no distension and no mass. There is no tenderness. There is no rebound and no guarding.  Genitourinary:     Musculoskeletal: Normal range of motion.  Neurological: He is alert and oriented to person, place, and time.  Skin: No rash noted. No pallor.  Psychiatric: He has a normal mood and affect. His behavior is normal.  Nursing note and vitals reviewed.    ED Treatments / Results  Labs (all labs ordered are listed, but only abnormal results are displayed) Labs Reviewed - No data to display  EKG  EKG Interpretation None       Radiology No results found.  Procedures Procedures (including critical care time)  Medications Ordered in ED Medications  lidocaine-EPINEPHrine (XYLOCAINE W/EPI) 1 %-1:100000 (with pres) injection 20 mL (not administered)     Initial Impression / Assessment and Plan / ED Course  I have reviewed the triage vital signs and the nursing notes.  Pertinent labs & imaging results that were available during my care of the patient were reviewed by me and  considered in my medical decision making (see chart for details).     61 yo M With a chief complaints of an abscess. He has had recurrent issues, all perirectal. Patient states normally the drain on their own. Denies fevers. On exam there is no drainable abscess. This is a recurrent issue will start on antibiotics and have him follow-up with general surgery.  1:04 PM:  I have discussed the diagnosis/risks/treatment options with the patient and family and believe the pt to be eligible for discharge home to follow-up with PCP. We also discussed returning to the ED immediately if new or worsening sx occur. We discussed the sx which are most concerning (e.g., sudden worsening pain, fever, inability to tolerate by mouth) that necessitate immediate return. Medications administered to the patient during their visit and any new prescriptions  provided to the patient are listed below.  Medications given during this visit Medications  lidocaine-EPINEPHrine (XYLOCAINE W/EPI) 1 %-1:100000 (with pres) injection 20 mL (not administered)     The patient appears reasonably screen and/or stabilized for discharge and I doubt any other medical condition or other [facility] requiring further screening, evaluation, or treatment in the ED at this time prior to discharge.    Final Clinical Impressions(s) / ED Diagnoses   Final diagnoses:  Abscess of buttock, right    New Prescriptions New Prescriptions   SULFAMETHOXAZOLE-TRIMETHOPRIM (BACTRIM DS,SEPTRA DS) 800-160 MG TABLET    Take 1 tablet by mouth 2 (two) times daily.     Deno Etienne, DO 10/14/16 1305

## 2016-10-14 NOTE — ED Triage Notes (Signed)
He c/o abscess at pilonidal area x 1 week. He is in no distress.

## 2016-10-14 NOTE — Discharge Instructions (Signed)
Warm compresses or sitz baths 4 times a day. This should make it come to ahead and drain. Return for fevers nausea or vomiting. As you have had recurrent issues in the same area a likely need to see a Psychologist, sport and exercise.

## 2016-11-20 ENCOUNTER — Other Ambulatory Visit: Payer: Self-pay | Admitting: Neurology

## 2016-11-20 NOTE — Telephone Encounter (Signed)
Patient must first schedule an appointment.  Then we can provide a refill for enough until that appointment.  If he misses that appointment, then we can no longer refill it.

## 2016-11-20 NOTE — Telephone Encounter (Signed)
Cancelled appt 07/14/16. No showed appt 07/28/16. No appt scheduled.  Please advise on refill.

## 2016-12-16 ENCOUNTER — Other Ambulatory Visit: Payer: Self-pay | Admitting: Neurology

## 2017-01-01 ENCOUNTER — Other Ambulatory Visit: Payer: Self-pay | Admitting: Neurology

## 2017-01-21 ENCOUNTER — Emergency Department (HOSPITAL_COMMUNITY)
Admission: EM | Admit: 2017-01-21 | Discharge: 2017-01-21 | Disposition: A | Discharge status: Home / Self Care | Payer: Medicare Other | Attending: Emergency Medicine | Admitting: Emergency Medicine

## 2017-01-21 ENCOUNTER — Emergency Department (HOSPITAL_COMMUNITY): Payer: Medicare Other

## 2017-01-21 DIAGNOSIS — F17210 Nicotine dependence, cigarettes, uncomplicated: Secondary | ICD-10-CM | POA: Diagnosis not present

## 2017-01-21 DIAGNOSIS — Z7984 Long term (current) use of oral hypoglycemic drugs: Secondary | ICD-10-CM | POA: Insufficient documentation

## 2017-01-21 DIAGNOSIS — Z79899 Other long term (current) drug therapy: Secondary | ICD-10-CM | POA: Insufficient documentation

## 2017-01-21 DIAGNOSIS — M549 Dorsalgia, unspecified: Secondary | ICD-10-CM | POA: Diagnosis present

## 2017-01-21 DIAGNOSIS — M546 Pain in thoracic spine: Secondary | ICD-10-CM | POA: Insufficient documentation

## 2017-01-21 DIAGNOSIS — E119 Type 2 diabetes mellitus without complications: Secondary | ICD-10-CM | POA: Insufficient documentation

## 2017-01-21 MED ORDER — CYCLOBENZAPRINE HCL 5 MG PO TABS: 5.0000 mg | ORAL_TABLET | Freq: Three times a day (TID) | ORAL | 0 refills | Status: DC | PRN

## 2017-01-21 MED ORDER — METHOCARBAMOL 500 MG PO TABS
500.0000 mg | ORAL_TABLET | Freq: Once | ORAL | Status: AC
  Administered 2017-01-21: 500 mg via ORAL
  Filled 2017-01-21: qty 1

## 2017-01-21 NOTE — ED Notes (Signed)
Patient refused blood draw.

## 2017-01-21 NOTE — Discharge Instructions (Signed)
Please see the information and instructions below regarding your visit.  Your diagnoses today include: No diagnosis found. Your back pain may be caused by a strain in the muscles of her upper back, possibly from overuse. Your exam is reassuring today.  Tests performed today include: See side panel of your discharge paperwork for testing performed today. Vital signs are listed at the bottom of these instructions.   -Xrays of your neck and spine  Medications prescribed:    Take any prescribed medications only as prescribed, and any over the counter medications only as directed on the packaging. You are prescribed Robaxin, a muscle relaxant. Some common side effects of this medication include:  Feeling sleepy.  Dizziness. Take care upon going from a seated to a standing position.  Dry mouth.  Feeling tired or weak.  Hard stools (constipation).  Upset stomach. These are not all of the side effects that may occur. If you have questions about side effects, call your doctor. Call your primary care provider for medical advice about side effects.  This medication can be sedating. Only take this medication as needed. Please do not combine with alcohol. Do not drive or operate machinery while taking this medication.   This medication can interact with some other medications. Make sure to tell any provider you are taking this medication before they prescribe you a new medication.    Home care instructions:  Please follow any educational materials contained in this packet.   Please apply warm compresses to your neck back, and shoulders.  Follow-up instructions: Please follow-up with your primary care provider in 1 week for further evaluation of your symptoms if they are not completely improved.   Return instructions:  Please return to the Emergency Department if you experience worsening symptoms.  Please return to the emergency department for any numbness or tingling in her arms or legs, new  weakness in her arms or legs, loss of bowel or bladder control, numbness in your groin, or pain that is not responding to therapies. Please return if you have any other emergent concerns.  Additional Information:   Your vital signs today were: BP (!) 143/87    Pulse 83    Temp 98.8 F (37.1 C)    Resp 16    SpO2 99%  If your blood pressure (BP) was elevated on multiple readings during this visit above 130 for the top number or above 80 for the bottom number, please have this repeated by your primary care provider within one month. --------------  Thank you for allowing [place] to participate in your care today.

## 2017-01-21 NOTE — ED Triage Notes (Signed)
Per EMS, pt presents with thoracic back pain x 2 days. Took aspirin this morning to attempt to relief pain. Hx DM, CBG 141. Denies SOB.

## 2017-01-21 NOTE — ED Provider Notes (Signed)
[place] DEPT Provider Note   CSN: [id number] Arrival date & time: 01/21/17  1003     History   Chief Complaint Chief Complaint  Patient presents with  . Back Pain    HPI [name] is a 61 y.o. male.  HPI  Patient is a 61 year old male with a history of lumbar sciatica right side and diabetes mellitus presenting for a 2 day history of cervical and thoracic back pain. Patient reports the pain is approximately 8-1/2 out of 10 in severity. Patient initially felt that he had slept wrong and tried readjusting his pills, however this did not provide relief. 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 Patient is denying any fever, chills, new numbness or weakness in the upper or lower extremities (Patient has chronic diabetic neuropathy), saddle anesthesia, loss of bowel or bladder control. Patient does not use IV drugs, has no history of cancer, has had a recent spinal procedures. Patient's only immunosuppressing risk factor is diabetes.  On chart review, patient was hospitalized for sciatic nerve low back pain in the setting of a fever in January of 2018. Workup negative for spinal abscess. MRI reviewed from that time. This current episode is a new pain not c/w that episode.   Past Medical History:  Diagnosis Date  . Blindness of right eye   . Diabetes mellitus   . Epilepsy ([place])    As reported by patient  . Seizures [facility])    since age 13    Patient Active Problem List   Diagnosis Date Noted  . Sciatica of right side 04/30/2016  . Fever 04/30/2016  . Localization-related symptomatic epilepsy and epileptic syndromes with complex partial seizures, not intractable, without status epilepticus ([place]) 01/29/2015  . Blindness of right eye   . LUNG ABSCESS 03/14/2008    No past surgical history on file.     Home Medications    Prior to Admission medications   Medication Sig Start  Date End Date Taking? Authorizing Provider  acetaminophen (TYLENOL) 325 MG tablet Take 2 tablets (650 mg total) by mouth every 6 (six) hours as needed for mild pain. 05/02/16   Arrien, Jimmy Picket, MD  cyclobenzaprine (FLEXERIL) 5 MG tablet Take 1 tablet (5 mg total) by mouth 3 (three) times daily as needed for muscle spasms. Patient not taking: Reported on 10/14/2016 05/02/16   Arrien, Jimmy Picket, MD  divalproex (DEPAKOTE ER) 500 MG 24 hr tablet Take 4 tablets (2,000 mg total) by mouth 2 (two) times daily. 02/05/16   Pieter Partridge, DO  levETIRAcetam (KEPPRA) 750 MG tablet TAKE 1 TABLET BY MOUTH TWICE A DAY 12/16/16   Tomi Likens, Adam R, DO  metFORMIN (GLUCOPHAGE) 500 MG tablet Take 500 mg by mouth daily with breakfast.  01/27/16   [provider]  naproxen sodium (ANAPROX) 275 MG tablet Take 1 tablet (275 mg total) by mouth 2 (two) times daily as needed for moderate pain. Patient not taking: Reported on 10/14/2016 05/02/16   Arrien, Jimmy Picket, MD  phenytoin (DILANTIN) 100 MG ER capsule Take 2 capsules (200 mg total) by mouth every morning. 08/21/16   Pieter Partridge, DO  phenytoin (DILANTIN) 50 MG tablet Chew 1 tablet (50 mg total) by mouth every evening. 09/22/16   Pieter Partridge, DO    Family History Family History  Problem Relation Age of Onset  . Diabetes Mother   . Hypertension Mother  Social History Social History  Substance Use Topics  . Smoking status: Current Every Day Smoker    Packs/day: 0.25    Types: Cigarettes  . Smokeless tobacco: Never Used  . Alcohol use Yes     Comment: one 40 oz beer each day     Allergies   Patient has no known allergies.   Review of Systems Review of Systems  Constitutional: Negative for chills and fever.  Respiratory: Negative for shortness of breath.   Cardiovascular: Negative for chest pain.  Gastrointestinal: Positive for nausea and vomiting.       Patient reports recent loose stools.  Genitourinary: Negative for difficulty  urinating.  Musculoskeletal: Positive for arthralgias, back pain and myalgias. Negative for gait problem.  Neurological: Negative for weakness and numbness.     Physical Exam Updated Vital Signs BP (!) 143/87   Pulse 83   Temp 98.8 F (37.1 C)   Resp 16   SpO2 99%   Physical Exam  Constitutional: He appears well-developed and well-nourished. No distress.  Sitting comfortably in bed.  HENT:  Head: Normocephalic and atraumatic.  Eyes: Conjunctivae are normal. Right eye exhibits no discharge. Left eye exhibits no discharge.  EOMs normal to gross examination.  Neck: Normal range of motion.  Cardiovascular: Normal rate and regular rhythm.   Intact, 2+ radial pulse.  Pulmonary/Chest:  Normal respiratory effort. Patient converses comfortably. No audible wheeze or stridor.  Abdominal: He exhibits no distension.  Genitourinary: Penile tenderness:    Musculoskeletal: Normal range of motion.  UPPER EXTREMITY EXAM:   INSPECTION & PALPATION: Cervical and thoracic paraspinal muscular tenderness, particularly over bilateral trapezius muscles. SENSORY: Sensation is intact to light touch in:  Superficial radial nerve distribution (dorsal first web space) Median nerve distribution (tip of index finger)   Ulnar nerve distribution (tip of small finger)    MOTOR:  5/5 grip strength, shoulder abduction/adduction, biceps flexion, extension. VASCULAR: 2+ radial pulse   Neurological: He is alert.  Cranial nerves intact to gross observation.   Skin: Skin is warm and dry. He is not diaphoretic.  Psychiatric: He has a normal mood and affect. His behavior is normal. Judgment and thought content normal.  Nursing note and vitals reviewed.    ED Treatments / Results  Labs (all labs ordered are listed, but only abnormal results are displayed) Labs Reviewed - No data to display  EKG  EKG Interpretation None       Radiology No results found.  Procedures Procedures (including  critical care time)  Medications Ordered in ED Medications - No data to display   Initial Impression / Assessment and Plan / ED Course  I have reviewed the triage vital signs and the nursing notes.  Pertinent labs & imaging results that were available during my care of the patient were reviewed by me and considered in my medical decision making (see chart for details).  Clinical Course as of Jan 22 1903  Thu Jan 21, 2017  1257 Patient seen and evaluated. Patient exhibiting signs of trapezius spasm. This pain is different from prior lumbar pain.  [AM]  1400 Nurse informing me that patient is refusing i-STAT Chem-8 drawn for creatinine. Will ultimately discharge patient with Robaxin only.  [AM]    Clinical Course User Index [AM] Albesa Seen, PA-C    Final Clinical Impressions(s) / ED Diagnoses   Final diagnoses:  None   MDM Patient with back pain. No neurological deficits. Patient is ambulatory. No warning symptoms of back pain  including: fecal incontinence, urinary retention or overflow incontinence, night sweats, waking from sleep with back pain, unexplained fevers or weight loss, h/o cancer, IVDU, recent trauma. No concern for epidural abscess, or other serious cause of thoracic back pain. Upper extremities are neurovascularly intact today, and show no evidence of brachial plexus or C-spine nerve root shingles in thoracic spine. Radiography demonstrates moderate degenerative changes but no compression fractures, laxity, or lytic lesions of cervical or thoracic spine. Suspect trapezius spasm. Patient is well-appearing and in no acute distress, and is amenable to muscle relaxant therapy. Conservative measures such as rest, ice/heat and pain medicine indicated with PCP follow-up if no improvement with conservative management. Patient reports he has close PCP follow-up coming up next week. Patient understands and is in agreement with the plan of care.   New Prescriptions New  Prescriptions   No medications on file     Tamala Julian 01/21/17 [place], Ankit, MD 01/22/17 [phone]

## 2017-01-23 ENCOUNTER — Emergency Department (HOSPITAL_COMMUNITY): Payer: Medicare Other

## 2017-01-23 ENCOUNTER — Observation Stay (HOSPITAL_COMMUNITY)
Admission: EM | Admit: 2017-01-23 | Discharge: 2017-01-25 | Disposition: A | Discharge status: Home / Self Care | Payer: Medicare Other | Attending: Internal Medicine | Admitting: Internal Medicine

## 2017-01-23 ENCOUNTER — Encounter (HOSPITAL_COMMUNITY): Payer: Self-pay | Admitting: Emergency Medicine

## 2017-01-23 DIAGNOSIS — E119 Type 2 diabetes mellitus without complications: Secondary | ICD-10-CM | POA: Diagnosis not present

## 2017-01-23 DIAGNOSIS — Z833 Family history of diabetes mellitus: Secondary | ICD-10-CM | POA: Insufficient documentation

## 2017-01-23 DIAGNOSIS — M549 Dorsalgia, unspecified: Secondary | ICD-10-CM | POA: Diagnosis present

## 2017-01-23 DIAGNOSIS — I10 Essential (primary) hypertension: Secondary | ICD-10-CM | POA: Diagnosis not present

## 2017-01-23 DIAGNOSIS — H44529 Atrophy of globe, unspecified eye: Secondary | ICD-10-CM | POA: Diagnosis not present

## 2017-01-23 DIAGNOSIS — D1800 Hemangioma unspecified site: Secondary | ICD-10-CM | POA: Diagnosis not present

## 2017-01-23 DIAGNOSIS — R000 Tachycardia, unspecified: Secondary | ICD-10-CM | POA: Diagnosis not present

## 2017-01-23 DIAGNOSIS — H5461 Unqualified visual loss, right eye, normal vision left eye: Secondary | ICD-10-CM | POA: Diagnosis not present

## 2017-01-23 DIAGNOSIS — R4781 Slurred speech: Secondary | ICD-10-CM | POA: Insufficient documentation

## 2017-01-23 DIAGNOSIS — R0789 Other chest pain: Secondary | ICD-10-CM | POA: Insufficient documentation

## 2017-01-23 DIAGNOSIS — R5383 Other fatigue: Secondary | ICD-10-CM | POA: Diagnosis not present

## 2017-01-23 DIAGNOSIS — T50901A Poisoning by unspecified drugs, medicaments and biological substances, accidental (unintentional), initial encounter: Secondary | ICD-10-CM | POA: Diagnosis present

## 2017-01-23 DIAGNOSIS — G40909 Epilepsy, unspecified, not intractable, without status epilepticus: Secondary | ICD-10-CM | POA: Diagnosis not present

## 2017-01-23 DIAGNOSIS — F17210 Nicotine dependence, cigarettes, uncomplicated: Secondary | ICD-10-CM | POA: Diagnosis not present

## 2017-01-23 DIAGNOSIS — Z8249 Family history of ischemic heart disease and other diseases of the circulatory system: Secondary | ICD-10-CM | POA: Diagnosis not present

## 2017-01-23 DIAGNOSIS — D72829 Elevated white blood cell count, unspecified: Secondary | ICD-10-CM | POA: Diagnosis not present

## 2017-01-23 DIAGNOSIS — Z7984 Long term (current) use of oral hypoglycemic drugs: Secondary | ICD-10-CM | POA: Insufficient documentation

## 2017-01-23 DIAGNOSIS — Z79899 Other long term (current) drug therapy: Secondary | ICD-10-CM | POA: Diagnosis not present

## 2017-01-23 DIAGNOSIS — T481X1A Poisoning by skeletal muscle relaxants [neuromuscular blocking agents], accidental (unintentional), initial encounter: Secondary | ICD-10-CM | POA: Diagnosis not present

## 2017-01-23 LAB — COMPREHENSIVE METABOLIC PANEL
ALBUMIN: 3.6 g/dL (ref 3.5–5.0)
ALK PHOS: 63 U/L (ref 38–126)
ALT: 11 U/L — ABNORMAL LOW (ref 17–63)
AST: 13 U/L — AB (ref 15–41)
Anion gap: 14 (ref 5–15)
BILIRUBIN TOTAL: 0.6 mg/dL (ref 0.3–1.2)
BUN: 14 mg/dL (ref 6–20)
CALCIUM: 9.1 mg/dL (ref 8.9–10.3)
CO2: 24 mmol/L (ref 22–32)
Chloride: 99 mmol/L — ABNORMAL LOW (ref 101–111)
Creatinine, Ser: 0.8 mg/dL (ref 0.61–1.24)
GFR calc Af Amer: >60 mL/min (ref >60)
GFR calc non Af Amer: >60 mL/min (ref >60)
GLUCOSE: 101 mg/dL — AB (ref 65–99)
POTASSIUM: 3.5 mmol/L (ref 3.5–5.1)
SODIUM: 137 mmol/L (ref 135–145)
TOTAL PROTEIN: 8.7 g/dL — AB (ref 6.5–8.1)

## 2017-01-23 LAB — LIPASE, BLOOD: Lipase: 21 U/L (ref 11–51)

## 2017-01-23 LAB — CBC
HCT: 42.7 % (ref 39.0–52.0)
Hemoglobin: 14.4 g/dL (ref 13.0–17.0)
MCH: 30.6 pg (ref 26.0–34.0)
MCHC: 33.7 g/dL (ref 30.0–36.0)
MCV: 90.9 fL (ref 78.0–100.0)
PLATELETS: 257 10*3/uL (ref 150–400)
RBC: 4.7 MIL/uL (ref 4.22–5.81)
RDW: 14.4 % (ref 11.5–15.5)
WBC: 18 10*3/uL — ABNORMAL HIGH (ref 4.0–10.5)

## 2017-01-23 LAB — ACETAMINOPHEN LEVEL: Acetaminophen (Tylenol), Serum: <10 ug/mL — ABNORMAL LOW (ref 10–30)

## 2017-01-23 LAB — SALICYLATE LEVEL: Salicylate Lvl: <7 mg/dL (ref 2.8–30.0)

## 2017-01-23 LAB — VALPROIC ACID LEVEL: Valproic Acid Lvl: <10 ug/mL — ABNORMAL LOW (ref 50.0–100.0)

## 2017-01-23 LAB — MAGNESIUM: MAGNESIUM: 1.7 mg/dL (ref 1.7–2.4)

## 2017-01-23 MED ORDER — SODIUM CHLORIDE 0.9 % IV BOLUS (SEPSIS)
1000.0000 mL | Freq: Once | INTRAVENOUS | Status: AC
  Administered 2017-01-23: 1000 mL via INTRAVENOUS

## 2017-01-23 NOTE — ED Notes (Signed)
Delay in lab draw,  Pt in CT. 

## 2017-01-23 NOTE — ED Triage Notes (Signed)
Care giver stated, he was given Flexeril and was given 20 , he has taken 14 within 24 hours.  He has said they didn't do anything for him.

## 2017-01-23 NOTE — ED Triage Notes (Signed)
Pt. Stated, Im having back pain upper and Im having left side pain all started 3 days ago. My care giver says I took an over dose, but I took extra and it didn't help any. It was the muscle relaxant.

## 2017-01-23 NOTE — ED Provider Notes (Signed)
[facility] DEPT Provider Note   CSN: [id number] Arrival date & time: 01/23/17  1711  History   Chief Complaint Chief Complaint  Patient presents with  . Back Pain  . Abdominal Pain   HPI [name] is a 61 y.o. male.  The patient is a 61 year old male with a past medical history significant for diabetes and epilepsy who presents to the ED complaining of back pain.  He was evaluated on 01/21/17 for pain to the left upper back.  He was diagnosed with a trapezius strain/sprain and given a prescription for Flexeril at that time.  The patient's aide arrived at the patient's home today; he noticed that his speech seemed slurred, and he seemed extremely tired.  The patient had taken 14 of his Flexeril in a 24-hour period, however exact timing of ingestion unknown.  The patient reports that he continued to take the Flexeril, hoping more medication would help relieve his pain, however without relief.  No history of malignancy or weight loss.  No numbness, tingling, weakness, or urinary symptoms.  The patient denies IVDA (ever).   The history is provided by the patient, medical records and a caregiver. No language interpreter was used.   Past Medical History:  Diagnosis Date  . Blindness of right eye   . Diabetes mellitus   . Epilepsy ([place])    As reported by patient  . Seizures [facility])    since age 48    Patient Active Problem List   Diagnosis Date Noted  . Sciatica of right side 04/30/2016  . Fever 04/30/2016  . Localization-related symptomatic epilepsy and epileptic syndromes with complex partial seizures, not intractable, without status epilepticus ([place]) 01/29/2015  . Blindness of right eye   . LUNG ABSCESS 03/14/2008   History reviewed. No pertinent surgical history.   Home Medications    Prior to Admission medications   Medication Sig Start Date End Date Taking? Authorizing Provider  acetaminophen (TYLENOL) 325 MG tablet Take 2 tablets (650 mg total) by mouth every 6 (six)  hours as needed for mild pain. 05/02/16  Yes Arrien, Jimmy Picket, MD  cyclobenzaprine (FLEXERIL) 5 MG tablet Take 1 tablet (5 mg total) by mouth 3 (three) times daily as needed for muscle spasms. 01/21/17  Yes Valere Dross, Alyssa B, PA-C  divalproex (DEPAKOTE ER) 500 MG 24 hr tablet Take 4 tablets (2,000 mg total) by mouth 2 (two) times daily. 02/05/16  Yes Jaffe, Adam R, DO  levETIRAcetam (KEPPRA) 750 MG tablet TAKE 1 TABLET BY MOUTH TWICE A DAY Patient taking differently: TAKE 750 mg  TABLET BY MOUTH TWICE A DAY 12/16/16  Yes Tomi Likens, Adam R, DO  metFORMIN (GLUCOPHAGE) 500 MG tablet Take 500 mg by mouth daily with breakfast.  01/27/16  Yes [provider]  phenytoin (DILANTIN) 100 MG ER capsule Take 2 capsules (200 mg total) by mouth every morning. 08/21/16  Yes Pieter Partridge, DO  phenytoin (DILANTIN) 50 MG tablet Chew 1 tablet (50 mg total) by mouth every evening. 09/22/16  Yes Jaffe, Adam R, DO  naproxen sodium (ANAPROX) 275 MG tablet Take 1 tablet (275 mg total) by mouth 2 (two) times daily as needed for moderate pain. Patient not taking: Reported on 10/14/2016 05/02/16   Arrien, Jimmy Picket, MD    Family History Family History  Problem Relation Age of Onset  . Diabetes Mother   . Hypertension Mother     Social History Social History  Substance Use Topics  . Smoking status: Current Every Day  Smoker    Packs/day: 0.25    Types: Cigarettes  . Smokeless tobacco: Never Used  . Alcohol use Yes     Comment: one 40 oz beer each day    Allergies   Patient has no known allergies.  Review of Systems Review of Systems  Unable to perform ROS: Mental status change  Respiratory: Negative for cough and shortness of breath.   Genitourinary: Negative for decreased urine volume, difficulty urinating and urgency.  Musculoskeletal: Positive for back pain.  Neurological:       Slurred speech  Psychiatric/Behavioral:       Somnolence   Physical Exam Updated Vital Signs BP (!) 176/103    Pulse 93   Temp 99.8 F (37.7 C) (Oral)   Resp 17   Ht 5\' 5"  (1.651 m)   Wt 63 kg (139 lb)   SpO2 96%   BMI 23.13 kg/m   Physical Exam  Constitutional: He appears well-developed and well-nourished. No distress.  HENT:  Head: Normocephalic and atraumatic.  Mouth/Throat: Oropharynx is clear and moist.  Eyes: Conjunctivae and EOM are normal.  Blindness of right eye; cornea opaque; left pupil responsive to light  Neck: Neck supple.  Cardiovascular: Regular rhythm, normal heart sounds and intact distal pulses.   No murmur heard. tachycardic  Pulmonary/Chest: Effort normal and breath sounds normal. No stridor. No respiratory distress.  Abdominal: Soft. He exhibits no distension and no mass. There is no tenderness. There is no guarding.  Musculoskeletal: He exhibits no edema.  Neurological: No cranial nerve deficit or sensory deficit. He exhibits normal muscle tone.  Somnolent but A&O x4 upon arousal; gait deferred  Skin: Skin is warm and dry.  Psychiatric: He has a normal mood and affect. His behavior is normal. Judgment and thought content normal.  Nursing note and vitals reviewed.  ED Treatments / Results  Labs (all labs ordered are listed, but only abnormal results are displayed) Labs Reviewed  COMPREHENSIVE METABOLIC PANEL - Abnormal; Notable for the following:       Result Value   Chloride 99 (*)    Glucose, Bld 101 (*)    Total Protein 8.7 (*)    AST 13 (*)    ALT 11 (*)    All other components within normal limits  CBC - Abnormal; Notable for the following:    WBC 18.0 (*)    All other components within normal limits  URINALYSIS, ROUTINE W REFLEX MICROSCOPIC - Abnormal; Notable for the following:    Specific Gravity, Urine >1.030 (*)    Bilirubin Urine MODERATE (*)    Ketones, ur 15 (*)    All other components within normal limits  VALPROIC ACID LEVEL - Abnormal; Notable for the following:    Valproic Acid Lvl <10 (*)    All other components within normal limits    ACETAMINOPHEN LEVEL - Abnormal; Notable for the following:    Acetaminophen (Tylenol), Serum <10 (*)    All other components within normal limits  LIPASE, BLOOD  MAGNESIUM  SALICYLATE LEVEL  RAPID URINE DRUG SCREEN, HOSP PERFORMED   EKG  EKG Interpretation None      EKG: LVH noted - Rate: tachycardia - Rhythm: normal sinus - Axis: no axis deviation - Intervals: normal PR, narrow QRS complex, and normal QTc - ST/T waves: no T wave or ST changes suggestive of ischemia or infarct - Comparison to prior: new tachycardia when compared to 01/07/16  Radiology Ct Head Wo Contrast  Result Date: 01/23/2017 CLINICAL DATA:  Altered mental status and speech difficulty. Left-sided pain starting 3 days ago. Patient report 14 Flexoril in 24 hours. EXAM: CT HEAD WITHOUT CONTRAST TECHNIQUE: Contiguous axial images were obtained from the base of the skull through the vertex without intravenous contrast. COMPARISON:  01/17/2010 FINDINGS: Brain: No evidence of acute infarction, hemorrhage, hydrocephalus, extra-axial collection or mass lesion/mass effect. Chronic mild cerebellar atrophy. Mild chronic small vessel ischemic disease. Vascular: Mild bilateral atherosclerosis at the carotid siphons. No hyperdense vessels. Skull: Negative for fracture or focal lesions. Sinuses/Orbits: Phthisis bulbi of the right globe. No acute sinus disease. Other: Clear mastoids. IMPRESSION: 1. Chronic stable appearance of the brain with cerebellar atrophy and mild chronic small vessel ischemia. 2. No acute intracranial abnormality. 3. Phthisis bulbi of the right globe. Electronically Signed   By: Ashley Royalty M.D.   On: 01/23/2017 22:00   Dg Chest Portable 1 View  Result Date: 01/23/2017 CLINICAL DATA:  Back and left chest pain. EXAM: PORTABLE CHEST 1 VIEW COMPARISON:  05/01/2016. FINDINGS: Decreased inspiration. Grossly stable normal sized heart and tortuous aorta. Interval mild linear density at both lung bases. Unremarkable  bones. IMPRESSION: Poor inspiration with mild bibasilar atelectasis. Electronically Signed   By: Claudie Revering M.D.   On: 01/23/2017 20:53    Procedures Procedures (including critical care time)  Medications Ordered in ED Medications  sodium chloride 0.9 % bolus 1,000 mL (1,000 mLs Intravenous New Bag/Given 01/23/17 2204)  sodium chloride 0.9 % bolus 1,000 mL (1,000 mLs Intravenous New Bag/Given 01/23/17 2356)  divalproex (DEPAKOTE ER) 24 hr tablet 2,000 mg (2,000 mg Oral Given 01/24/17 0025)  ibuprofen (ADVIL,MOTRIN) tablet 400 mg (400 mg Oral Given 01/24/17 0025)    Initial Impression / Assessment and Plan / ED Course  I have reviewed the triage vital signs and the nursing notes.  Pertinent labs & imaging results that were available during my care of the patient were reviewed by me and considered in my medical decision making (see chart for details).    Initial differential diagnosis included musculoskeletal strain/sprain, fracture, metabolic derangement, UTI, drug overdose, intoxication, and CVA.    I spoke with the [facility] regarding the patient's Flexeril ingestion.  Poison Control recommended a minimum 6-hour observation period and/or return to normal vitals.  They also recommended evaluation for hallucinations, somnolence, hypotension, and arrhythmias, in addition to evaluating the patient's Depakote level (if increased, the patient will require recheck after 3-4 hours to ensure the level is declining).  Pertinent labs included CBC with leukocytosis.  CMP without significant electrolyte abnormalities; normal AG without AKI or transaminitis.  Magnesium level normal.  UA with ketones and moderate bilirubin, however no evidence of infection.  UDS positive for cocaine.  EKG notable for sinus tachycardia; no QTc prolongation.  Imaging studies included CXR with mild atelectasis (poor inspiration).  Head CT without acute intracranial abnormality.  The patient was given  IVF for tachycardia and suspected overdose.  Upon reassessment, he continued to complain of back pain and was given Motrin.  Based on the above findings, I suspect the patient's somnolence is likely secondary to his Flexeril ingestion.  I agree that his back pain is likely musculoskeletal in nature.  After several hours of observation in the ED, the patient remained somnolent and was unable to ambulate.  The patient was admitted to the Hospitalist service for further evaluation and treatment.  The patient was in stable condition at the time of admission.  Final Clinical Impressions(s) / ED Diagnoses  Final diagnoses:  Drug overdose, accidental or unintentional, initial encounter  Other acute back pain   New Prescriptions New Prescriptions   No medications on file     Charisse March, MD 01/24/17 2767    Elnora Morrison, MD 01/29/17 [phone]

## 2017-01-23 NOTE — ED Notes (Signed)
Patient transported to CT 

## 2017-01-24 ENCOUNTER — Observation Stay (HOSPITAL_COMMUNITY): Payer: Medicare Other

## 2017-01-24 ENCOUNTER — Encounter (HOSPITAL_COMMUNITY): Payer: Self-pay | Admitting: Internal Medicine

## 2017-01-24 DIAGNOSIS — R000 Tachycardia, unspecified: Secondary | ICD-10-CM | POA: Diagnosis not present

## 2017-01-24 DIAGNOSIS — M549 Dorsalgia, unspecified: Secondary | ICD-10-CM

## 2017-01-24 DIAGNOSIS — D72829 Elevated white blood cell count, unspecified: Secondary | ICD-10-CM | POA: Diagnosis present

## 2017-01-24 DIAGNOSIS — T481X1A Poisoning by skeletal muscle relaxants [neuromuscular blocking agents], accidental (unintentional), initial encounter: Secondary | ICD-10-CM | POA: Diagnosis not present

## 2017-01-24 DIAGNOSIS — T50901D Poisoning by unspecified drugs, medicaments and biological substances, accidental (unintentional), subsequent encounter: Secondary | ICD-10-CM

## 2017-01-24 DIAGNOSIS — I10 Essential (primary) hypertension: Secondary | ICD-10-CM | POA: Diagnosis not present

## 2017-01-24 DIAGNOSIS — T50901A Poisoning by unspecified drugs, medicaments and biological substances, accidental (unintentional), initial encounter: Secondary | ICD-10-CM

## 2017-01-24 DIAGNOSIS — M546 Pain in thoracic spine: Secondary | ICD-10-CM | POA: Diagnosis not present

## 2017-01-24 HISTORY — DX: Essential (primary) hypertension: I10

## 2017-01-24 HISTORY — DX: Poisoning by unspecified drugs, medicaments and biological substances, accidental (unintentional), initial encounter: T50.901A

## 2017-01-24 LAB — RAPID URINE DRUG SCREEN, HOSP PERFORMED
AMPHETAMINES: NOT DETECTED
BARBITURATES: NOT DETECTED
Benzodiazepines: NOT DETECTED
Cocaine: POSITIVE — AB
OPIATES: NOT DETECTED
TETRAHYDROCANNABINOL: NOT DETECTED

## 2017-01-24 LAB — URINALYSIS, ROUTINE W REFLEX MICROSCOPIC
GLUCOSE, UA: NEGATIVE mg/dL
HGB URINE DIPSTICK: NEGATIVE
Ketones, ur: 15 mg/dL — AB
Leukocytes, UA: NEGATIVE
Nitrite: NEGATIVE
PROTEIN: NEGATIVE mg/dL
pH: 6 (ref 5.0–8.0)

## 2017-01-24 LAB — GLUCOSE, CAPILLARY
GLUCOSE-CAPILLARY: 99 mg/dL (ref 65–99)
Glucose-Capillary: 86 mg/dL (ref 65–99)
Glucose-Capillary: 142 mg/dL — ABNORMAL HIGH (ref 65–99)
Glucose-Capillary: 86 mg/dL (ref 65–99)

## 2017-01-24 LAB — HIV ANTIBODY (ROUTINE TESTING W REFLEX): HIV SCREEN 4TH GENERATION: NONREACTIVE

## 2017-01-24 LAB — TROPONIN I

## 2017-01-24 LAB — COMPREHENSIVE METABOLIC PANEL
ALBUMIN: 2.9 g/dL — AB (ref 3.5–5.0)
ALT: 11 U/L — ABNORMAL LOW (ref 17–63)
ANION GAP: 9 (ref 5–15)
AST: 12 U/L — AB (ref 15–41)
Alkaline Phosphatase: 48 U/L (ref 38–126)
BILIRUBIN TOTAL: 0.6 mg/dL (ref 0.3–1.2)
BUN: 13 mg/dL (ref 6–20)
CHLORIDE: 104 mmol/L (ref 101–111)
CO2: 24 mmol/L (ref 22–32)
Calcium: 8.3 mg/dL — ABNORMAL LOW (ref 8.9–10.3)
Creatinine, Ser: 0.69 mg/dL (ref 0.61–1.24)
GFR calc Af Amer: >60 mL/min (ref >60)
GFR calc non Af Amer: >60 mL/min (ref >60)
GLUCOSE: 97 mg/dL (ref 65–99)
POTASSIUM: 3.2 mmol/L — AB (ref 3.5–5.1)
SODIUM: 137 mmol/L (ref 135–145)
TOTAL PROTEIN: 6.3 g/dL — AB (ref 6.5–8.1)

## 2017-01-24 LAB — CBC
HCT: 36.8 % — ABNORMAL LOW (ref 39.0–52.0)
HEMOGLOBIN: 12.1 g/dL — AB (ref 13.0–17.0)
MCH: 29.7 pg (ref 26.0–34.0)
MCHC: 32.9 g/dL (ref 30.0–36.0)
MCV: 90.2 fL (ref 78.0–100.0)
PLATELETS: 204 10*3/uL (ref 150–400)
RBC: 4.08 MIL/uL — ABNORMAL LOW (ref 4.22–5.81)
RDW: 14.3 % (ref 11.5–15.5)
WBC: 14.5 10*3/uL — ABNORMAL HIGH (ref 4.0–10.5)

## 2017-01-24 LAB — HEMOGLOBIN A1C
Hgb A1c MFr Bld: 5.1 % (ref 4.8–5.6)
MEAN PLASMA GLUCOSE: 99.67 mg/dL

## 2017-01-24 MED ORDER — ACETAMINOPHEN 325 MG PO TABS: 650.0000 mg | ORAL_TABLET | Freq: Four times a day (QID) | ORAL | Status: DC | PRN

## 2017-01-24 MED ORDER — PHENYTOIN 50 MG PO CHEW
50.0000 mg | CHEWABLE_TABLET | Freq: Every evening | ORAL | Status: DC
  Administered 2017-01-24: 50 mg via ORAL
  Filled 2017-01-24 (×2): qty 1

## 2017-01-24 MED ORDER — POTASSIUM CHLORIDE CRYS ER 20 MEQ PO TBCR
40.0000 meq | EXTENDED_RELEASE_TABLET | Freq: Two times a day (BID) | ORAL | Status: AC
  Administered 2017-01-24 (×2): 40 meq via ORAL
  Filled 2017-01-24 (×2): qty 2

## 2017-01-24 MED ORDER — LORAZEPAM 2 MG/ML IJ SOLN: 2.0000 mg | Freq: Once | INTRAMUSCULAR | Status: DC

## 2017-01-24 MED ORDER — LEVETIRACETAM 750 MG PO TABS
750.0000 mg | ORAL_TABLET | Freq: Two times a day (BID) | ORAL | Status: DC
  Administered 2017-01-24 – 2017-01-25 (×3): 750 mg via ORAL
  Filled 2017-01-24 (×3): qty 1

## 2017-01-24 MED ORDER — SODIUM CHLORIDE 0.9 % IV SOLN
INTRAVENOUS | Status: AC
  Administered 2017-01-24: 04:00:00 via INTRAVENOUS

## 2017-01-24 MED ORDER — INSULIN ASPART 100 UNIT/ML ~~LOC~~ SOLN: 0.0000 [IU] | Freq: Every day | SUBCUTANEOUS | Status: DC

## 2017-01-24 MED ORDER — OXYCODONE HCL 5 MG PO TABS
5.0000 mg | ORAL_TABLET | Freq: Four times a day (QID) | ORAL | Status: DC | PRN
  Administered 2017-01-24 – 2017-01-25 (×3): 5 mg via ORAL
  Filled 2017-01-24 (×3): qty 1

## 2017-01-24 MED ORDER — LORAZEPAM 2 MG/ML IJ SOLN
1.0000 mg | Freq: Once | INTRAMUSCULAR | Status: AC
  Administered 2017-01-24: 1 mg via INTRAVENOUS

## 2017-01-24 MED ORDER — LORAZEPAM 2 MG/ML IJ SOLN
INTRAMUSCULAR | Status: AC
  Filled 2017-01-24: qty 1

## 2017-01-24 MED ORDER — ENOXAPARIN SODIUM 40 MG/0.4ML ~~LOC~~ SOLN
40.0000 mg | Freq: Every day | SUBCUTANEOUS | Status: DC
  Administered 2017-01-24 – 2017-01-25 (×2): 40 mg via SUBCUTANEOUS
  Filled 2017-01-24 (×2): qty 0.4

## 2017-01-24 MED ORDER — ACETAMINOPHEN 650 MG RE SUPP: 650.0000 mg | Freq: Four times a day (QID) | RECTAL | Status: DC | PRN

## 2017-01-24 MED ORDER — METFORMIN HCL 500 MG PO TABS
500.0000 mg | ORAL_TABLET | Freq: Every day | ORAL | Status: DC
  Administered 2017-01-24 – 2017-01-25 (×2): 500 mg via ORAL
  Filled 2017-01-24 (×2): qty 1

## 2017-01-24 MED ORDER — INSULIN ASPART 100 UNIT/ML ~~LOC~~ SOLN
0.0000 [IU] | Freq: Three times a day (TID) | SUBCUTANEOUS | Status: DC
  Administered 2017-01-24: 1 [IU] via SUBCUTANEOUS

## 2017-01-24 MED ORDER — DIVALPROEX SODIUM ER 500 MG PO TB24
2000.0000 mg | ORAL_TABLET | Freq: Once | ORAL | Status: AC
  Administered 2017-01-24: 2000 mg via ORAL
  Filled 2017-01-24: qty 4

## 2017-01-24 MED ORDER — HYDRALAZINE HCL 20 MG/ML IJ SOLN: 10.0000 mg | Freq: Four times a day (QID) | INTRAMUSCULAR | Status: DC | PRN

## 2017-01-24 MED ORDER — PHENYTOIN SODIUM EXTENDED 100 MG PO CAPS
200.0000 mg | ORAL_CAPSULE | Freq: Every morning | ORAL | Status: DC
  Administered 2017-01-24 – 2017-01-25 (×2): 200 mg via ORAL
  Filled 2017-01-24 (×2): qty 2

## 2017-01-24 MED ORDER — IBUPROFEN 400 MG PO TABS
400.0000 mg | ORAL_TABLET | Freq: Once | ORAL | Status: AC
  Administered 2017-01-24: 400 mg via ORAL
  Filled 2017-01-24: qty 1

## 2017-01-24 NOTE — Progress Notes (Signed)
MRI called and said patient requested something to calm him down for his procedure. Administered 1 mg lorazepam IV in MRI. No scanner was available. Remaining amount wasted in sink with Marc Morgans, Therapist, sports.

## 2017-01-24 NOTE — Progress Notes (Signed)
[name]  is a 61 y.o. male,  with prior h/o  seizure , dm2, comes in for overdose of flexiril to get help from the back pain.Marland Kitchen He was admitted for evaluation of back. Pain.  MRI OF the thoracic spine ordered, does not show any disc herniation or spinal stenosis.  Added pain meds.  PT evaluation.    Hosie Poisson, MD  [phone]

## 2017-01-24 NOTE — H&P (Addendum)
TRH H&P   Patient Demographics:    [name], is a 61 y.o. male  MRN: [id number]   DOB - 1955/11/17  Admit Date - 01/23/2017  Outpatient Primary MD for the patient is Burman Riis, NP  Referring MD/NP/PA: Charisse March  Outpatient Specialists:   Patient coming from: home  Chief Complaint  Patient presents with  . Back Pain  . Abdominal Pain      HPI:    [name]  is a 60 y.o. male,  w seizure do, dm2, apparently c/o thoracic spine pain.  No hx of fall.  Apparently pt took 10+ flexeril.  Pt is axox3.  Pt denies radiation of pain, denies fever, incontinence.   In ED, pt was evaluated. Wbc 18.0,  Bun/creat 14/0.80  CT brain negative , CXR negative. Pt will be admitted for observation for overdose on flexeril.      Review of systems:    In addition to the HPI above, No Fever-chills, No Headache, No changes with Vision or hearing, No problems swallowing food or Liquids, No Chest pain, Cough or Shortness of Breath, No Abdominal pain, No Nausea or Vommitting, Bowel movements are regular, No Blood in stool or Urine, No dysuria, No new skin rashes or bruises, No new joints pains-aches,  No new weakness, tingling, numbness in any extremity, No recent weight gain or loss, No polyuria, polydypsia or polyphagia, No significant Mental Stressors.  A full 10 point Review of Systems was done, except as stated above, all other Review of Systems were negative.   With Past History of the following :    Past Medical History:  Diagnosis Date  . Blindness of right eye   . Diabetes mellitus   . Epilepsy ([place])    As reported by patient  . Seizures ([place])    since age 30      History reviewed. No pertinent surgical history.    Social History:     Social History  Substance Use Topics  . Smoking status: Current Every Day Smoker    Packs/day: 0.25    Types:  Cigarettes  . Smokeless tobacco: Never Used  . Alcohol use Yes     Comment: one 40 oz beer each day     Lives - at home  Mobility - walks by self   Family History :     Family History  Problem Relation Age of Onset  . Diabetes Mother   . Hypertension Mother       Home Medications:   Prior to Admission medications   Medication Sig Start Date End Date Taking? Authorizing Provider  acetaminophen (TYLENOL) 325 MG tablet Take 2 tablets (650 mg total) by mouth every 6 (six) hours as needed for mild pain. 05/02/16  Yes Arrien, Jimmy Picket, MD  cyclobenzaprine (FLEXERIL) 5 MG tablet Take 1 tablet (5 mg total) by mouth 3 (three) times  daily as needed for muscle spasms. 01/21/17  Yes Valere Dross, Alyssa B, PA-C  divalproex (DEPAKOTE ER) 500 MG 24 hr tablet Take 4 tablets (2,000 mg total) by mouth 2 (two) times daily. 02/05/16  Yes Jaffe, Adam R, DO  levETIRAcetam (KEPPRA) 750 MG tablet TAKE 1 TABLET BY MOUTH TWICE A DAY Patient taking differently: TAKE 750 mg  TABLET BY MOUTH TWICE A DAY 12/16/16  Yes Tomi Likens, Adam R, DO  metFORMIN (GLUCOPHAGE) 500 MG tablet Take 500 mg by mouth daily with breakfast.  01/27/16  Yes [provider]  phenytoin (DILANTIN) 100 MG ER capsule Take 2 capsules (200 mg total) by mouth every morning. 08/21/16  Yes Pieter Partridge, DO  phenytoin (DILANTIN) 50 MG tablet Chew 1 tablet (50 mg total) by mouth every evening. 09/22/16  Yes Jaffe, Adam R, DO  naproxen sodium (ANAPROX) 275 MG tablet Take 1 tablet (275 mg total) by mouth 2 (two) times daily as needed for moderate pain. Patient not taking: Reported on 10/14/2016 05/02/16   Arrien, Jimmy Picket, MD     Allergies:    No Known Allergies   Physical Exam:   Vitals  Blood pressure (!) 173/97, pulse 93, temperature 99.8 F (37.7 C), temperature source Oral, resp. rate (!) 21, height 5\' 5"  (1.651 m), weight 63 kg (139 lb), SpO2 95 %.   1. General  lying in bed in NAD,    2. Normal affect and insight, Not  Suicidal or Homicidal, Awake Alert, Oriented X 3.  3. No F.N deficits, ALL C.Nerves Intact, Strength 5/5 all 4 extremities, Sensation intact all 4 extremities, Plantars down going.  4. Ears and Eyes appear Normal, Conjunctivae clear, PERRLA. Moist Oral Mucosa.  5. Supple Neck, No JVD, No cervical lymphadenopathy appriciated, No Carotid Bruits.  6. Symmetrical Chest wall movement, Good air movement bilaterally, CTAB.  7. RRR, No Gallops, Rubs or Murmurs, No Parasternal Heave.  8. Positive Bowel Sounds, Abdomen Soft, No tenderness, No organomegaly appriciated,No rebound -guarding or rigidity.  9.  No Cyanosis, Normal Skin Turgor, No Skin Rash or Bruise.  10. Good muscle tone,  joints appear normal , no effusions, Normal ROM.  11. No Palpable Lymph Nodes in Neck or Axillae     Data Review:    CBC  Recent Labs Lab 01/23/17 1725  WBC 18.0*  HGB 14.4  HCT 42.7  PLT 257  MCV 90.9  MCH 30.6  MCHC 33.7  RDW 14.4   ------------------------------------------------------------------------------------------------------------------  Chemistries   Recent Labs Lab 01/23/17 1725 01/23/17 2156  NA 137  --   K 3.5  --   CL 99*  --   CO2 24  --   GLUCOSE 101*  --   BUN 14  --   CREATININE 0.80  --   CALCIUM 9.1  --   MG  --  1.7  AST 13*  --   ALT 11*  --   ALKPHOS 63  --   BILITOT 0.6  --    ------------------------------------------------------------------------------------------------------------------ estimated creatinine clearance is 84.3 mL/min (by C-G formula based on SCr of 0.8 mg/dL). ------------------------------------------------------------------------------------------------------------------ No results for input(s): TSH, T4TOTAL, T3FREE, THYROIDAB in the last 72 hours.  Invalid input(s): FREET3  Coagulation profile No results for input(s): INR, PROTIME in the last 168  hours. ------------------------------------------------------------------------------------------------------------------- No results for input(s): DDIMER in the last 72 hours. -------------------------------------------------------------------------------------------------------------------  Cardiac Enzymes No results for input(s): CKMB, TROPONINI, MYOGLOBIN in the last 168 hours.  Invalid input(s): CK ------------------------------------------------------------------------------------------------------------------ No results found for: BNP   ---------------------------------------------------------------------------------------------------------------  Urinalysis    Component Value Date/Time   COLORURINE YELLOW 01/23/2017 2345   APPEARANCEUR CLEAR 01/23/2017 2345   LABSPEC >1.030 (H) 01/23/2017 2345   PHURINE 6.0 01/23/2017 2345   GLUCOSEU NEGATIVE 01/23/2017 2345   HGBUR NEGATIVE 01/23/2017 2345   BILIRUBINUR MODERATE (A) 01/23/2017 2345   KETONESUR 15 (A) 01/23/2017 2345   PROTEINUR NEGATIVE 01/23/2017 2345   UROBILINOGEN 0.2 11/03/2013 1116   NITRITE NEGATIVE 01/23/2017 2345   LEUKOCYTESUR NEGATIVE 01/23/2017 2345    ----------------------------------------------------------------------------------------------------------------   Imaging Results:    Ct Head Wo Contrast  Result Date: 01/23/2017 CLINICAL DATA:  Altered mental status and speech difficulty. Left-sided pain starting 3 days ago. Patient report 14 Flexoril in 24 hours. EXAM: CT HEAD WITHOUT CONTRAST TECHNIQUE: Contiguous axial images were obtained from the base of the skull through the vertex without intravenous contrast. COMPARISON:  01/17/2010 FINDINGS: Brain: No evidence of acute infarction, hemorrhage, hydrocephalus, extra-axial collection or mass lesion/mass effect. Chronic mild cerebellar atrophy. Mild chronic small vessel ischemic disease. Vascular: Mild bilateral atherosclerosis at the carotid siphons. No  hyperdense vessels. Skull: Negative for fracture or focal lesions. Sinuses/Orbits: Phthisis bulbi of the right globe. No acute sinus disease. Other: Clear mastoids. IMPRESSION: 1. Chronic stable appearance of the brain with cerebellar atrophy and mild chronic small vessel ischemia. 2. No acute intracranial abnormality. 3. Phthisis bulbi of the right globe. Electronically Signed   By: Ashley Royalty M.D.   On: 01/23/2017 22:00   Dg Chest Portable 1 View  Result Date: 01/23/2017 CLINICAL DATA:  Back and left chest pain. EXAM: PORTABLE CHEST 1 VIEW COMPARISON:  05/01/2016. FINDINGS: Decreased inspiration. Grossly stable normal sized heart and tortuous aorta. Interval mild linear density at both lung bases. Unremarkable bones. IMPRESSION: Poor inspiration with mild bibasilar atelectasis. Electronically Signed   By: Claudie Revering M.D.   On: 01/23/2017 20:53     Assessment & Plan:    Principal Problem:   Overdose Active Problems:   Leukocytosis   Hypertension   Tachycardia   Back pain    Overdose Tele Pt axox3 Will observe, if doing well can be discharge home tomorrow  Leukocytosis Unclear ? Pain Repeat cbc in am  Tachycardia Tele Trop I q6h x3  Hypertension Hydralazine 10mg  iv q6h prn   Back pain MRI T spine  Dm2 Cont metformin fsbs ac and qhs, ISS  Seizure do Cont depakote, cont keppra   DVT Prophylaxis lovenox SCDs  AM Labs Ordered, also please review Full Orders  Family Communication: Admission, patients condition and plan of care including tests being ordered have been discussed with the patient  who indicate understanding and agree with the plan and Code Status.  Code Status FULL CODE  Likely DC to  home  Condition GUARDED    Consults called: none  Admission status: observation  Time spent in minutes : 45   Jani Gravel M.D on 01/24/2017 at 1:55 AM  Between 7am to 7pm - Pager - [phone]. After 7pm go to [email or web address] - password [email or web address]  [facility]  - Office  [phone]

## 2017-01-24 NOTE — Progress Notes (Signed)
Pt arrived to unit via transport at 0300 and was safely assisted in bed. Able to make needs known. Safety maintained. Skin intact. # 20 to rfa and patent. Daily wt = 69.6 kg.via bed scale. Pleasant and cooperative. Fluids infusing per order. Denies any pain at this time. Room and callbell system orientation completed and educated on IVF and ordered diet. Safety maintained. callbell within reach. Will continue to monitor.

## 2017-01-25 DIAGNOSIS — I10 Essential (primary) hypertension: Secondary | ICD-10-CM | POA: Diagnosis not present

## 2017-01-25 DIAGNOSIS — T481X1A Poisoning by skeletal muscle relaxants [neuromuscular blocking agents], accidental (unintentional), initial encounter: Secondary | ICD-10-CM | POA: Diagnosis not present

## 2017-01-25 DIAGNOSIS — T50901A Poisoning by unspecified drugs, medicaments and biological substances, accidental (unintentional), initial encounter: Secondary | ICD-10-CM | POA: Diagnosis not present

## 2017-01-25 LAB — GLUCOSE, CAPILLARY
Glucose-Capillary: 81 mg/dL (ref 65–99)
Glucose-Capillary: 100 mg/dL — ABNORMAL HIGH (ref 65–99)

## 2017-01-25 LAB — CBC
HCT: 38 % — ABNORMAL LOW (ref 39.0–52.0)
Hemoglobin: 12.5 g/dL — ABNORMAL LOW (ref 13.0–17.0)
MCH: 29.8 pg (ref 26.0–34.0)
MCHC: 32.9 g/dL (ref 30.0–36.0)
MCV: 90.7 fL (ref 78.0–100.0)
PLATELETS: 263 10*3/uL (ref 150–400)
RBC: 4.19 MIL/uL — AB (ref 4.22–5.81)
RDW: 14.4 % (ref 11.5–15.5)
WBC: 13.8 10*3/uL — AB (ref 4.0–10.5)

## 2017-01-25 LAB — BASIC METABOLIC PANEL
ANION GAP: 8 (ref 5–15)
BUN: 8 mg/dL (ref 6–20)
CO2: 25 mmol/L (ref 22–32)
Calcium: 9 mg/dL (ref 8.9–10.3)
Chloride: 106 mmol/L (ref 101–111)
Creatinine, Ser: 0.69 mg/dL (ref 0.61–1.24)
GFR calc Af Amer: >60 mL/min (ref >60)
Glucose, Bld: 83 mg/dL (ref 65–99)
POTASSIUM: 3.6 mmol/L (ref 3.5–5.1)
SODIUM: 139 mmol/L (ref 135–145)

## 2017-01-25 NOTE — Care Management Obs Status (Signed)
[place] NOTIFICATION   Patient Details  Name: [name] MRN: [id number] Date of Birth: [birth year]   Medicare Observation Status Notification Given:  Yes    Zenon Mayo, RN 01/25/2017, 12:40 PM

## 2017-01-25 NOTE — Progress Notes (Signed)
Gelene Mink to be D/C'd Home per MD order.  Discussed with the patient and all questions fully answered.  VSS, Skin clean, dry and intact without evidence of skin break down, no evidence of skin tears noted. IV catheter discontinued intact. Site without signs and symptoms of complications. Dressing and pressure applied.  An After Visit Summary was printed and given to the patient. Patient received prescription.  D/c education completed with patient/family including follow up instructions, medication list, d/c activities limitations if indicated, with other d/c instructions as indicated by MD - patient able to verbalize understanding, all questions fully answered.   Patient instructed to return to ED, call 911, or call MD for any changes in condition.   Patient escorted via [facility], and D/C home via private auto.  Dorris Carnes 01/25/2017 3:59 PM

## 2017-01-25 NOTE — Evaluation (Signed)
Physical Therapy Evaluation Patient Details Name: [name] MRN: [id number] DOB: [birth year] Today's Date: 01/25/2017   History of Present Illness  Patient is a 61 y/o male who presents with thoracic back pain. MRI of spine- unremarkable. PMH includes seizures, sciatica, blindness.  Clinical Impression  Patient presents with back pain and impaired balance s/p above. Reports he woke up with this pain and after many questions stated he may have had seizure in the middle of the night. Has some tense musculature on left paraspinals and cervical spine most likely related to muscle strain. Education re: stretches, mobility and heat packs. Pt tolerated gait training with MinA-min guard assist for balance/safety. Pt lives alone but has an aide 7 days/week. Will follow acutely to maximize independence and mobility prior to return home.     Follow Up Recommendations No PT follow up;Supervision - Intermittent    Equipment Recommendations  None recommended by PT    Recommendations for Other Services       Precautions / Restrictions Precautions Precautions: None Restrictions Weight Bearing Restrictions: No      Mobility  Bed Mobility Overal bed mobility: Modified Independent             General bed mobility comments: No assist needed. use of rail.   Transfers Overall transfer level: Needs assistance Equipment used: None Transfers: Sit to/from Stand Sit to Stand: Min guard         General transfer comment: Min guard for safety. Stood from [employer].   Ambulation/Gait Ambulation/Gait assistance: Min assist Ambulation Distance (Feet): 120 Feet Assistive device: None Gait Pattern/deviations: Step-through pattern;Decreased stride length;Narrow base of support;Scissoring Gait velocity: decreased Gait velocity interpretation: Below normal speed for age/gender General Gait Details: Slow, unsteady gait reaching to hold onto rail for support to mimic as SPC. a few standing rest  breaks. HR in early 100s.   Stairs            Wheelchair Mobility    Modified Rankin (Stroke Patients Only)       Balance Overall balance assessment: Needs assistance Sitting-balance support: Feet supported;No upper extremity supported Sitting balance-Leahy Scale: Fair     Standing balance support: During functional activity Standing balance-Leahy Scale: Fair Standing balance comment: Able to stand statically without UE support but needs UE support at times.                              Pertinent Vitals/Pain Pain Assessment: Faces Faces Pain Scale: Hurts even more Pain Location: left back  Pain Descriptors / Indicators: Sore;Tender Pain Intervention(s): Monitored during session;Repositioned;Limited activity within patient's tolerance    Home Living Family/patient expects to be discharged to:: Private residence Living Arrangements: Alone Available Help at Discharge: Personal care attendant Type of Home: Apartment Home Access: Stairs to enter Entrance Stairs-Rails: Right Entrance Stairs-Number of Steps: 2 Home Layout: One level Home Equipment: Cane - single point      Prior Function Level of Independence: Independent         Comments: Has an aide that comes 7 days/week to assist with IADLs, driving. Uses SPC as needed.      Hand Dominance   Dominant Hand: Right    Extremity/Trunk Assessment   Upper Extremity Assessment Upper Extremity Assessment: Defer to OT evaluation    Lower Extremity Assessment Lower Extremity Assessment: Generalized weakness       Communication   Communication: No difficulties  Cognition Arousal/Alertness: Awake/alert Behavior During Therapy:  WFL for tasks assessed/performed Overall Cognitive Status: Within Functional Limits for tasks assessed                                 General Comments: for basic mobility tasks.       General Comments General comments (skin integrity, edema, etc.): VSS  throughout. Pt with palpable tightness along paraspinals on left side of thoracic spine, Provided some STM to loosen and encouraged heat.     Exercises     Assessment/Plan    PT Assessment Patient needs continued PT services  PT Problem List Decreased strength;Decreased mobility;Pain;Decreased balance;Decreased activity tolerance       PT Treatment Interventions Therapeutic activities;Gait training;Patient/family education;Therapeutic exercise;Balance training;Functional mobility training;Stair training;Manual techniques    PT Goals (Current goals can be found in the Care Plan section)  Acute Rehab PT Goals Patient Stated Goal: to get out of this bed and be able to walk PT Goal Formulation: With patient Time For Goal Achievement: 02/08/17 Potential to Achieve Goals: Fair    Frequency Min 3X/week   Barriers to discharge        Co-evaluation               AM-PAC PT "6 Clicks" Daily Activity  Outcome Measure Difficulty turning over in bed (including adjusting bedclothes, sheets and blankets)?: None Difficulty moving from lying on back to sitting on the side of the bed? : None Difficulty sitting down on and standing up from a chair with arms (e.g., wheelchair, bedside commode, etc,.)?: None Help needed moving to and from a bed to chair (including a wheelchair)?: None Help needed walking in hospital room?: A Little Help needed climbing 3-5 steps with a railing? : A Little 6 Click Score: 22    End of Session Equipment Utilized During Treatment: Gait belt Activity Tolerance: Patient tolerated treatment well Patient left: in bed;with call bell/phone within reach;with bed alarm set Nurse Communication: Mobility status PT Visit Diagnosis: Unsteadiness on feet (R26.81);Muscle weakness (generalized) (M62.81);Difficulty in walking, not elsewhere classified (R26.2)    Time: 5208-0223 PT Time Calculation (min) (ACUTE ONLY): 23 min   Charges:   PT Evaluation $PT Eval Low  Complexity: 1 Low PT Treatments $Gait Training: 8-22 mins   PT G Codes:   PT G-Codes **NOT FOR INPATIENT CLASS** Functional Limitation: Mobility: Walking and moving around Mobility: Walking and Moving Around Current Status (V6122): At least 20 percent but less than 40 percent impaired, limited or restricted Mobility: Walking and Moving Around Goal Status [phone]): At least 1 percent but less than 20 percent impaired, limited or restricted    [place], [place], [place] 01/25/2017, 12:24 PM

## 2017-01-25 NOTE — Care Management Note (Signed)
Case Management Note  Patient Details  Name: LONDEN BOK MRN: [id number] Date of Birth: 1956-02-25  Subjective/Objective:   From home alone, presents with back pain, he states he has an aide that is with him 7 days a week from 12:30 to 2 pm.  He goes to [facility] for follow up.  Per pt eval , he needs no pt f/u.  He states his aide gets off work at 5 pm and he will be transporting him home when discharged.                   Action/Plan: NCM will follow for dc needs.   Expected Discharge Date:  01/25/17                 Expected Discharge Plan:  Home/Self Care  In-House Referral:     Discharge planning Services  CM Consult  Post Acute Care Choice:    Choice offered to:     DME Arranged:    DME Agency:     HH Arranged:    HH Agency:     Status of Service:  Completed, signed off  If discussed at [employer] of Stay Meetings, dates discussed:    Additional Comments:  Zenon Mayo, RN 01/25/2017, 12:36 PM

## 2017-01-26 NOTE — Discharge Summary (Signed)
Physician Discharge Summary  [name]:[id number] DOB: 21-Jun-1955 DOA: 01/23/2017  PCP: Burman Riis, NP  Admit date: 01/23/2017 Discharge date: 01/25/2017  Admitted From: Home.  Disposition:  Home   Recommendations for Outpatient Follow-up:  1. Follow up with PCP in 1-2 weeks 2. Please obtain BMP/CBC in one week   Discharge Condition:stable CODE STATUS: full code.  Diet recommendation: Heart Healthy  Brief/Interim Summary: EricWarrenis a 61 y.o.male,with prior h/o  seizure , dm2, comes in for overdose of flexiril to get help from the back pain.Marland Kitchen He was admitted for evaluation of back. Pain.  MRI OF the thoracic spine ordered, does not show any disc herniation or spinal stenosis. PT evaluation ordered.   Discharge Diagnoses:  Principal Problem:   Overdose Active Problems:   Leukocytosis   Hypertension   Tachycardia   Back pain  Hypertension:  Controlled.    Low back pain;  MRI of the thoracic spine, does not show any disc herniation or spinal stenosis.     Diabetes mellitus:  CBG (last 3)   Recent Labs  01/24/17 2128 01/25/17 0803 01/25/17 1215  GLUCAP 99 81 100*    Resume home medications.   flexiril overdose:  Unintentional.  No signs of overdose.    Discharge Instructions  Discharge Instructions    Diet - low sodium heart healthy    Complete by:  As directed    Discharge instructions    Complete by:  As directed    Please follow up with PCP in one week.     Allergies as of 01/25/2017   No Known Allergies     Medication List    STOP taking these medications   cyclobenzaprine 5 MG tablet Commonly known as:  FLEXERIL   naproxen sodium 275 MG tablet Commonly known as:  ANAPROX     TAKE these medications   acetaminophen 325 MG tablet Commonly known as:  TYLENOL Take 2 tablets (650 mg total) by mouth every 6 (six) hours as needed for mild pain.   divalproex 500 MG 24 hr tablet Commonly known as:  DEPAKOTE  ER Take 4 tablets (2,000 mg total) by mouth 2 (two) times daily.   levETIRAcetam 750 MG tablet Commonly known as:  KEPPRA TAKE 1 TABLET BY MOUTH TWICE A DAY What changed:  See the new instructions.   metFORMIN 500 MG tablet Commonly known as:  GLUCOPHAGE Take 500 mg by mouth daily with breakfast.   phenytoin 100 MG ER capsule Commonly known as:  DILANTIN Take 2 capsules (200 mg total) by mouth every morning.   phenytoin 50 MG tablet Commonly known as:  DILANTIN Chew 1 tablet (50 mg total) by mouth every evening.      Follow-up Information    Blunt-Evans, Lachelle A, NP. Schedule an appointment as soon as possible for a visit in 1 week(s).   Specialty:  Family Medicine Why:  follow up appt , post hospitalization visit.  Contact information: [address] [place] [address] [phone]          No Known Allergies  Consultations:  None.    Procedures/Studies: Dg Cervical Spine 2-3 Views  Result Date: 01/21/2017 CLINICAL DATA:  Back pain EXAM: CERVICAL SPINE - 2-3 VIEW COMPARISON:  None. FINDINGS: Dens is partially obscured. Lateral masses are within normal limits. Cervical alignment within normal limits. Vertebral body heights are normal. Moderate degenerative changes at C4-C5, C5-C6 and C6-C7. IMPRESSION: Moderate degenerative changes. No radiographic evidence for acute osseous abnormality. Electronically Signed  By: Donavan Foil M.D.   On: 01/21/2017 14:13   Dg Thoracic Spine 2 View  Result Date: 01/21/2017 CLINICAL DATA:  Two days of thoracic back pain. History of seizure activity EXAM: THORACIC SPINE 2 VIEWS COMPARISON:  Chest x-ray of May 01, 2016 FINDINGS: There is curvature of the thoracolumbar spine convex toward the right. This appears stable. The thoracic pedicles are intact. There are no abnormal paravertebral soft tissue densities. The intervertebral disc space heights are reasonably well-maintained. IMPRESSION: Chronic curvature of the  thoracolumbar spine. No compression fracture or significant disc space narrowing of the thoracic spine. Electronically Signed   By: David  Martinique M.D.   On: 01/21/2017 14:07   Ct Head Wo Contrast  Result Date: 01/23/2017 CLINICAL DATA:  Altered mental status and speech difficulty. Left-sided pain starting 3 days ago. Patient report 14 Flexoril in 24 hours. EXAM: CT HEAD WITHOUT CONTRAST TECHNIQUE: Contiguous axial images were obtained from the base of the skull through the vertex without intravenous contrast. COMPARISON:  01/17/2010 FINDINGS: Brain: No evidence of acute infarction, hemorrhage, hydrocephalus, extra-axial collection or mass lesion/mass effect. Chronic mild cerebellar atrophy. Mild chronic small vessel ischemic disease. Vascular: Mild bilateral atherosclerosis at the carotid siphons. No hyperdense vessels. Skull: Negative for fracture or focal lesions. Sinuses/Orbits: Phthisis bulbi of the right globe. No acute sinus disease. Other: Clear mastoids. IMPRESSION: 1. Chronic stable appearance of the brain with cerebellar atrophy and mild chronic small vessel ischemia. 2. No acute intracranial abnormality. 3. Phthisis bulbi of the right globe. Electronically Signed   By: Ashley Royalty M.D.   On: 01/23/2017 22:00   Mr Thoracic Spine Wo Contrast  Result Date: 01/24/2017 CLINICAL DATA:  Thoracic spine pain. EXAM: MRI THORACIC SPINE WITHOUT CONTRAST TECHNIQUE: Multiplanar, multisequence MR imaging of the thoracic spine was performed. No intravenous contrast was administered. COMPARISON:  Thoracic spine radiographs 01/21/2017. Chest CT 02/23/2008. FINDINGS: Alignment: Slight left convex curvature of the thoracic spine. No listhesis. Vertebrae: No fracture, suspicious osseous lesion, or significant marrow edema. Minimal degenerative endplate changes at O96-29. Small hemangiomas in the upper thoracic spine. Cord:  Normal signal morphology. Paraspinal and other soft tissues: Trace left pleural effusion. Disc  levels: Mild facet arthrosis bilaterally from T9-10 to T11-12. No disc herniation. Widely patent spinal canal and neural foramina. IMPRESSION: Mild lower thoracic facet arthrosis. No disc herniation or stenosis. Electronically Signed   By: Logan Bores M.D.   On: 01/24/2017 11:35   Dg Chest Portable 1 View  Result Date: 01/23/2017 CLINICAL DATA:  Back and left chest pain. EXAM: PORTABLE CHEST 1 VIEW COMPARISON:  05/01/2016. FINDINGS: Decreased inspiration. Grossly stable normal sized heart and tortuous aorta. Interval mild linear density at both lung bases. Unremarkable bones. IMPRESSION: Poor inspiration with mild bibasilar atelectasis. Electronically Signed   By: Claudie Revering M.D.   On: 01/23/2017 20:53       Subjective: Pain is much better.   Discharge Exam: Vitals:   01/25/17 0547 01/25/17 1448  BP: (!) 156/98 (!) 154/86  Pulse: 95 100  Resp: 17 16  Temp: 99.5 F (37.5 C) 98.9 F (37.2 C)  SpO2: 96% 97%   Vitals:   01/24/17 2118 01/25/17 0305 01/25/17 0547 01/25/17 1448  BP: (!) 152/96  (!) 156/98 (!) 154/86  Pulse: 94  95 100  Resp: 17  17 16   Temp: 100 F (37.8 C)  99.5 F (37.5 C) 98.9 F (37.2 C)  TempSrc: Oral  Oral Oral  SpO2: 99%  96% 97%  Weight:  64.9 kg (143 lb 1.3 oz)    Height:        General: Pt is alert, awake, not in acute distress Cardiovascular: RRR, S1/S2 +, no rubs, no gallops Respiratory: CTA bilaterally, no wheezing, no rhonchi Abdominal: Soft, NT, ND, bowel sounds + Extremities: no edema, no cyanosis    The results of significant diagnostics from this hospitalization (including imaging, microbiology, ancillary and laboratory) are listed below for reference.     Microbiology: No results found for this or any previous visit (from the past 240 hour(s)).   Labs: BNP (last 3 results) No results for input(s): BNP in the last 8760 hours. Basic Metabolic Panel:  Recent Labs Lab 01/23/17 1725 01/23/17 2156 01/24/17 0340 01/25/17 0757   NA 137  --  137 139  K 3.5  --  3.2* 3.6  CL 99*  --  104 106  CO2 24  --  24 25  GLUCOSE 101*  --  97 83  BUN 14  --  13 8  CREATININE 0.80  --  0.69 0.69  CALCIUM 9.1  --  8.3* 9.0  MG  --  1.7  --   --    Liver Function Tests:  Recent Labs Lab 01/23/17 1725 01/24/17 0340  AST 13* 12*  ALT 11* 11*  ALKPHOS 63 48  BILITOT 0.6 0.6  PROT 8.7* 6.3*  ALBUMIN 3.6 2.9*    Recent Labs Lab 01/23/17 1725  LIPASE 21   No results for input(s): AMMONIA in the last 168 hours. CBC:  Recent Labs Lab 01/23/17 1725 01/24/17 0340 01/25/17 0757  WBC 18.0* 14.5* 13.8*  HGB 14.4 12.1* 12.5*  HCT 42.7 36.8* 38.0*  MCV 90.9 90.2 90.7  PLT 257 204 263   Cardiac Enzymes:  Recent Labs Lab 01/24/17 0340  TROPONINI <0.03   BNP: Invalid input(s): POCBNP CBG:  Recent Labs Lab 01/24/17 1200 01/24/17 1643 01/24/17 2128 01/25/17 0803 01/25/17 1215  GLUCAP 142* 86 99 81 100*   D-Dimer No results for input(s): DDIMER in the last 72 hours. Hgb A1c  Recent Labs  01/24/17 0340  HGBA1C 5.1   Lipid Profile No results for input(s): CHOL, HDL, LDLCALC, TRIG, CHOLHDL, LDLDIRECT in the last 72 hours. Thyroid function studies No results for input(s): TSH, T4TOTAL, T3FREE, THYROIDAB in the last 72 hours.  Invalid input(s): FREET3 Anemia work up No results for input(s): VITAMINB12, FOLATE, FERRITIN, TIBC, IRON, RETICCTPCT in the last 72 hours. Urinalysis    Component Value Date/Time   COLORURINE YELLOW 01/23/2017 2345   APPEARANCEUR CLEAR 01/23/2017 2345   LABSPEC >1.030 (H) 01/23/2017 2345   PHURINE 6.0 01/23/2017 2345   GLUCOSEU NEGATIVE 01/23/2017 2345   HGBUR NEGATIVE 01/23/2017 2345   BILIRUBINUR MODERATE (A) 01/23/2017 2345   KETONESUR 15 (A) 01/23/2017 2345   PROTEINUR NEGATIVE 01/23/2017 2345   UROBILINOGEN 0.2 11/03/2013 1116   NITRITE NEGATIVE 01/23/2017 2345   LEUKOCYTESUR NEGATIVE 01/23/2017 2345   Sepsis Labs Invalid input(s): PROCALCITONIN,  WBC,   LACTICIDVEN Microbiology No results found for this or any previous visit (from the past 240 hour(s)).   Time coordinating discharge: Over 30 minutes  SIGNED:   Hosie Poisson, MD  [facility] 01/26/2017, 12:32 PM Pager   If 7PM-7AM, please contact night-coverage [email or web address] Password [email or web address]

## 2017-02-06 ENCOUNTER — Emergency Department (HOSPITAL_COMMUNITY)
Admission: EM | Admit: 2017-02-06 | Discharge: 2017-02-06 | Disposition: A | Discharge status: Home / Self Care | Payer: Medicare Other | Attending: Emergency Medicine | Admitting: Emergency Medicine

## 2017-02-06 ENCOUNTER — Encounter (HOSPITAL_COMMUNITY): Payer: Self-pay | Admitting: Emergency Medicine

## 2017-02-06 DIAGNOSIS — Z7984 Long term (current) use of oral hypoglycemic drugs: Secondary | ICD-10-CM | POA: Diagnosis not present

## 2017-02-06 DIAGNOSIS — Z79899 Other long term (current) drug therapy: Secondary | ICD-10-CM | POA: Insufficient documentation

## 2017-02-06 DIAGNOSIS — E119 Type 2 diabetes mellitus without complications: Secondary | ICD-10-CM | POA: Diagnosis not present

## 2017-02-06 DIAGNOSIS — F17210 Nicotine dependence, cigarettes, uncomplicated: Secondary | ICD-10-CM | POA: Diagnosis not present

## 2017-02-06 DIAGNOSIS — I10 Essential (primary) hypertension: Secondary | ICD-10-CM | POA: Diagnosis not present

## 2017-02-06 DIAGNOSIS — Z760 Encounter for issue of repeat prescription: Secondary | ICD-10-CM | POA: Insufficient documentation

## 2017-02-06 MED ORDER — DIVALPROEX SODIUM ER 500 MG PO TB24: 2000.0000 mg | ORAL_TABLET | Freq: Two times a day (BID) | ORAL | 0 refills | Status: DC

## 2017-02-06 MED ORDER — PHENYTOIN SODIUM EXTENDED 100 MG PO CAPS: 200.0000 mg | ORAL_CAPSULE | ORAL | 0 refills | Status: DC

## 2017-02-06 NOTE — Discharge Instructions (Signed)
I have written you prescriptions for your Dilantin and Depakote medications today. These have been printed to that you can take them to any pharmacy.  Please return to the emergency department if you have any new or concerning symptoms.

## 2017-02-06 NOTE — ED Provider Notes (Signed)
[facility] DEPT Provider Note   CSN: [id number] Arrival date & time: 02/06/17  1223     History   Chief Complaint Chief Complaint  Patient presents with  . Medication Refill    HPI [name] is a 61 y.o. male.  HPI   [name] is a 61yo male with a history of seizure disorder, right eye bilndness, overdose, HTN, back pain who presents to the Emergency Department stating "I need a paper prescription for my seizure medicines because the pharmacy is out of power." Patient states that he ran out of his Depakote and morning dose of Dilantin today. He states that he took his last dose this morning and does not have anti-epileptic medication for tonight or tomorrow morning. He has no complaints. No recent seizure activity, no dizziness, confusion, headache. States that he has been prescribed this medication for several months now and that it works well for him.     Past Medical History:  Diagnosis Date  . Blindness of right eye   . Diabetes mellitus   . Epilepsy ([place])    As reported by patient  . Seizures [facility])    since age 21    Patient Active Problem List   Diagnosis Date Noted  . Leukocytosis 01/24/2017  . Overdose 01/24/2017  . Hypertension 01/24/2017  . Tachycardia 01/24/2017  . Back pain 01/24/2017  . Sciatica of right side 04/30/2016  . Fever 04/30/2016  . Localization-related symptomatic epilepsy and epileptic syndromes with complex partial seizures, not intractable, without status epilepticus ([place]) 01/29/2015  . Blindness of right eye   . LUNG ABSCESS 03/14/2008    History reviewed. No pertinent surgical history.     Home Medications    Prior to Admission medications   Medication Sig Start Date End Date Taking? Authorizing Provider  acetaminophen (TYLENOL) 325 MG tablet Take 2 tablets (650 mg total) by mouth every 6 (six) hours as needed for mild pain. 05/02/16   Arrien, Jimmy Picket, MD  divalproex (DEPAKOTE ER) 500 MG 24 hr tablet Take 4 tablets  (2,000 mg total) by mouth 2 (two) times daily. 02/05/16   Pieter Partridge, DO  divalproex (DEPAKOTE ER) 500 MG 24 hr tablet Take 4 tablets (2,000 mg total) by mouth 2 (two) times daily. 02/06/17   Glyn Ade, PA-C  levETIRAcetam (KEPPRA) 750 MG tablet TAKE 1 TABLET BY MOUTH TWICE A DAY Patient taking differently: TAKE 750 mg  TABLET BY MOUTH TWICE A DAY 12/16/16   Tomi Likens, Adam R, DO  metFORMIN (GLUCOPHAGE) 500 MG tablet Take 500 mg by mouth daily with breakfast.  01/27/16   [provider]  phenytoin (DILANTIN) 100 MG ER capsule Take 2 capsules (200 mg total) by mouth every morning. 08/21/16   Pieter Partridge, DO  phenytoin (DILANTIN) 100 MG ER capsule Take 2 capsules (200 mg total) by mouth every morning. 02/06/17   Glyn Ade, PA-C  phenytoin (DILANTIN) 50 MG tablet Chew 1 tablet (50 mg total) by mouth every evening. 09/22/16   Pieter Partridge, DO    Family History Family History  Problem Relation Age of Onset  . Diabetes Mother   . Hypertension Mother     Social History Social History  Substance Use Topics  . Smoking status: Current Every Day Smoker    Packs/day: 0.25    Types: Cigarettes  . Smokeless tobacco: Never Used  . Alcohol use Yes     Comment: one 40 oz beer each day  Allergies   Patient has no known allergies.   Review of Systems Review of Systems  Constitutional: Negative for chills, fatigue and fever.  Neurological: Negative for dizziness, seizures, weakness, numbness and headaches.     Physical Exam Updated Vital Signs BP (!) 133/91 (BP Location: Left Arm)   Pulse 79   Temp 98.1 F (36.7 C) (Oral)   Resp 17   Ht 5\' 5"  (1.651 m)   Wt 64.9 kg (143 lb)   SpO2 99%   BMI 23.80 kg/m   Physical Exam  Constitutional: He is oriented to person, place, and time. He appears well-developed and well-nourished. No distress.  Calm, answers questions appropriately  HENT:  Head: Normocephalic and atraumatic.  Eyes: Pupils are equal, round, and  reactive to light. EOM are normal. Right eye exhibits no discharge. Left eye exhibits no discharge.  Cardiovascular: Normal rate and regular rhythm.  Exam reveals no friction rub.   No murmur heard. Pulmonary/Chest: Effort normal and breath sounds normal. No respiratory distress. He has no wheezes. He has no rales.  Neurological: He is alert and oriented to person, place, and time. Coordination normal.  Skin: He is not diaphoretic.  Psychiatric: He has a normal mood and affect. His behavior is normal.  Nursing note and vitals reviewed.    ED Treatments / Results  Labs (all labs ordered are listed, but only abnormal results are displayed) Labs Reviewed - No data to display  EKG  EKG Interpretation None       Radiology No results found.  Procedures Procedures (including critical care time)  Medications Ordered in ED Medications - No data to display   Initial Impression / Assessment and Plan / ED Course  I have reviewed the triage vital signs and the nursing notes.  Pertinent labs & imaging results that were available during my care of the patient were reviewed by me and considered in my medical decision making (see chart for details).    Patient with history of seizure disorder on several anti-epileptics who presents requesting medication refill given pharmacy is out of power and cannot access EMR. No recent seizure activity. He ran out of his medicine today and otherwise taking it as prescribed. No neurological deficits on exam. Will fill the prescription and have discussed return precautions. Patient agrees and voices understanding to the above plan.   Final Clinical Impressions(s) / ED Diagnoses   Final diagnoses:  Encounter for medication refill    New Prescriptions Discharge Medication List as of 02/06/2017  4:22 PM    START taking these medications   Details  !! divalproex (DEPAKOTE ER) 500 MG 24 hr tablet Take 4 tablets (2,000 mg total) by mouth 2 (two) times  daily., Starting Sat 02/06/2017, Print    !! phenytoin (DILANTIN) 100 MG ER capsule Take 2 capsules (200 mg total) by mouth every morning., Starting Sat 02/06/2017, Print     !! - Potential duplicate medications found. Please discuss with provider.       Glyn Ade, PA-C 02/06/17 1827    Daleen Bo, MD 02/07/17 [phone]

## 2017-02-06 NOTE — ED Notes (Signed)
Declined W/C at D/C and was escorted to lobby by RN. 

## 2017-02-06 NOTE — ED Notes (Signed)
Pt reports that he took his last medication this morning but that he has no more refills stating that his pharmacy is out of power.

## 2017-02-06 NOTE — ED Triage Notes (Signed)
Pt. Stated, Im fine I just need 2 medications filled.

## 2017-02-06 NOTE — ED Notes (Signed)
Pt refused vitals 

## 2017-02-18 ENCOUNTER — Other Ambulatory Visit: Payer: Self-pay | Admitting: Neurology

## 2017-03-08 ENCOUNTER — Telehealth: Payer: Self-pay | Admitting: Neurology

## 2017-03-08 ENCOUNTER — Other Ambulatory Visit: Payer: Self-pay | Admitting: *Deleted

## 2017-03-08 MED ORDER — DIVALPROEX SODIUM ER 500 MG PO TB24
2000.0000 mg | ORAL_TABLET | Freq: Two times a day (BID) | ORAL | 0 refills | Status: DC
Start: 2017-03-08 — End: 2017-03-17

## 2017-03-08 NOTE — Telephone Encounter (Signed)
Pt called and needs a prescription for his Depakote called in, he said he is out and needs it done today

## 2017-03-08 NOTE — Telephone Encounter (Signed)
Patient called and needs to have a refill on his Depakote medication. The pharmacy # is [phone]. Thanks

## 2017-03-08 NOTE — Telephone Encounter (Signed)
Patient given a month supply of Depakote to get him through until his next appointment.

## 2017-03-09 ENCOUNTER — Other Ambulatory Visit: Payer: Self-pay | Admitting: *Deleted

## 2017-03-09 NOTE — Telephone Encounter (Signed)
Rx was sent in yesterday.  Patient needs an appointment.

## 2017-03-17 ENCOUNTER — Other Ambulatory Visit: Payer: Medicare Other

## 2017-03-17 ENCOUNTER — Ambulatory Visit (INDEPENDENT_AMBULATORY_CARE_PROVIDER_SITE_OTHER): Payer: Medicare Other | Admitting: Neurology

## 2017-03-17 ENCOUNTER — Encounter: Payer: Self-pay | Admitting: Neurology

## 2017-03-17 VITALS — BP 134/72 | HR 72 | Ht 65.0 in | Wt 144.4 lb

## 2017-03-17 DIAGNOSIS — G40209 Localization-related (focal) (partial) symptomatic epilepsy and epileptic syndromes with complex partial seizures, not intractable, without status epilepticus: Secondary | ICD-10-CM

## 2017-03-17 MED ORDER — LEVETIRACETAM 750 MG PO TABS: 750.0000 mg | ORAL_TABLET | Freq: Two times a day (BID) | ORAL | 11 refills | Status: DC

## 2017-03-17 MED ORDER — DIVALPROEX SODIUM ER 500 MG PO TB24: 2000.0000 mg | ORAL_TABLET | Freq: Two times a day (BID) | ORAL | 11 refills | Status: DC

## 2017-03-17 MED ORDER — PHENYTOIN 50 MG PO CHEW: 50.0000 mg | CHEWABLE_TABLET | Freq: Every evening | ORAL | 11 refills | Status: DC

## 2017-03-17 MED ORDER — PHENYTOIN SODIUM EXTENDED 100 MG PO CAPS: 200.0000 mg | ORAL_CAPSULE | ORAL | 11 refills | Status: DC

## 2017-03-17 NOTE — Patient Instructions (Addendum)
1.  Continue and refilled:   Phenytoin (Dilantin) 200mg  in morning and 50mg  at night     Depakote ER 2000mg  twice daily     levetiracetam 750mg  twice daily 2.  Take calcium 500mg  twice daily and vitamin D 600 IU twice daily 3.  Check valproic acid level and phenytoin level 4.  Follow up in one year

## 2017-03-17 NOTE — Progress Notes (Signed)
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     UPDATE: Medications:  Depakote ER 2000mg  twice daily, Keppra 750mg  twice daily, Dilantin 200mg  AM and 50mg  PM. He has not had a seizure since last visit.  He says he is feeling well.  LABS: 01/24/17: hepatic panel with t bili 0.6, ALP 48, AST 12 and ALT 11. 01/25/17: CBC with WBC 13.8, HGB 12.5, HCT 38, PLT 263; BMP with Na 139, K 3.6, Cl 106, CO2 25, glucose 83, BUN 8 and Cr 0.69.    HISTORY: He began having seizures at age 34.  It may have been precipitated by a head injury after falling out of his crib.     Semiology:  Aura of colored lights in right visual field.  It may last several minutes.  Usually the warning of the aura gives him time to lay down and call the ambulance.  Sometimes it resolves, while other times it progresses to a full-blown seizure, described as loss of consciousness, generalized jerking and tongue biting.  He averages approximately 1-2 seizures per year.  But he says last seizure was around 1 to 1.5 years ago.  He takes Depakote ER 2000mg  twice daily, phenytoin 200mg  in AM and 50mg  at night, Keppra 750mg  twice daily.  He is taking calcium and vitamin D.   Prior medication:  phenobarbital.  Higher doses of Dilantin caused ataxia.   09/20/09 MRI Brain w/o:  mild generalized atrophy with prominence to the cerebellum.  Post-operative changes of right globe.  PAST MEDICAL HISTORY: Past Medical History:  Diagnosis Date  . Blindness of right eye   . Diabetes mellitus   . Epilepsy ([place])    As reported by patient  . Seizures ([place])    since age 65    MEDICATIONS: Current Outpatient Medications on File Prior to Visit  Medication Sig Dispense Refill  . acetaminophen (TYLENOL) 325 MG tablet Take 2 tablets (650  mg total) by mouth every 6 (six) hours as needed for mild pain. 30 tablet 0  . metFORMIN (GLUCOPHAGE) 500 MG tablet Take 500 mg by mouth daily with breakfast.      No current facility-administered medications on file prior to visit.     ALLERGIES: No Known Allergies  FAMILY HISTORY: Family History  Problem Relation Age of Onset  . Diabetes Mother   . Hypertension Mother     SOCIAL HISTORY: Social History   Socioeconomic History  . Marital status: Single    Spouse name: Not on file  . Number of children: Not on file  . Years of education: Not on file  . Highest education level: Not on file  Social Needs  . Financial resource strain: Not on file  . Food insecurity - worry: Not on file  . Food insecurity - inability: Not on file  . Transportation needs - medical: Not on file  . Transportation needs - non-medical: Not on file  Occupational History  . Not on file  Tobacco Use  . Smoking status: Current Every Day Smoker    Packs/day: 0.25    Types: Cigarettes  . Smokeless tobacco: Never Used  Substance and Sexual Activity  . Alcohol use: Yes    Comment: one 40 oz beer each day  . Drug use: Yes    Types: Cocaine  Comment: cocaine--last used 2weeks ago  . Sexual activity: No  Other Topics Concern  . Not on file  Social History Narrative  . Not on file    REVIEW OF SYSTEMS: Constitutional: No fevers, chills, or sweats, no generalized fatigue, change in appetite Eyes: No visual changes, double vision, eye pain Ear, nose and throat: No hearing loss, ear pain, nasal congestion, sore throat Cardiovascular: No chest pain, palpitations Respiratory:  No shortness of breath at rest or with exertion, wheezes GastrointestinaI: No nausea, vomiting, diarrhea, abdominal pain, fecal incontinence Genitourinary:  No dysuria, urinary retention or frequency Musculoskeletal:  No neck pain, back pain Integumentary: No rash, pruritus, skin lesions Neurological: as above Psychiatric:  No depression, insomnia, anxiety Endocrine: No palpitations, fatigue, diaphoresis, mood swings, change in appetite, change in weight, increased thirst Hematologic/Lymphatic:  No purpura, petechiae. Allergic/Immunologic: no itchy/runny eyes, nasal congestion, recent allergic reactions, rashes  PHYSICAL EXAM: Vitals:   03/17/17 1046  BP: 134/72  Pulse: 72  SpO2: 94%   General: No acute distress.  Patient appears well-groomed.  Head:  Normocephalic/atraumatic Eyes:  Fundi examined but not visualized Neurological Exam: alert and oriented to person, place, and time. Attention span and concentration intact, recent and remote memory intact, fund of knowledge intact.  Speech fluent and not dysarthric, language intact.  CN II-XII intact. Bulk and tone normal, muscle strength 5/5 throughout.  Sensation to light touch, temperature and vibration intact.  Deep tendon reflexes 2+ throughout, toes downgoing.  Finger to nose and heel to shin testing intact.  Gait normal, Romberg negative.  IMPRESSION: Complex partial seizures with secondary generalization.    PLAN: 1.  Continue Dilantin 200mg  in AM and 50mg  at night, Depakote ER 2000mg  twice daily and Keppra 750mg  twice daily 2.  While on Dilantin, he should take calcium 500mg  twice daily and vitamin D 600 IU twice daily 3.  Check trough valproic acid and phenytoin levels 4.  Follow up in one year or as needed.  25 minutes spent face to face with patient, over 50% spent discussing management.  Metta Clines, DO  CC:  Collins Scotland, NP

## 2017-03-18 LAB — VALPROIC ACID LEVEL: VALPROIC ACID LVL: 107.5 mg/L — AB (ref 50.0–100.0)

## 2017-03-18 LAB — PHENYTOIN LEVEL, TOTAL: PHENYTOIN, TOTAL: 10.5 mg/L (ref 10.0–20.0)

## 2017-03-24 ENCOUNTER — Telehealth: Payer: Self-pay

## 2017-03-24 NOTE — Telephone Encounter (Signed)
Called and spoke with Pt, advsd him of lab results.

## 2017-03-24 NOTE — Telephone Encounter (Signed)
-----   Message from Pieter Partridge, DO sent at 03/23/2017  7:04 AM EST ----- Labs are okay.

## 2017-03-26 ENCOUNTER — Encounter: Payer: Self-pay | Admitting: Gastroenterology

## 2017-05-19 ENCOUNTER — Other Ambulatory Visit: Payer: Self-pay

## 2017-05-19 ENCOUNTER — Ambulatory Visit (AMBULATORY_SURGERY_CENTER): Payer: Self-pay | Admitting: *Deleted

## 2017-05-19 VITALS — Ht 65.0 in | Wt 145.0 lb

## 2017-05-19 DIAGNOSIS — Z1211 Encounter for screening for malignant neoplasm of colon: Secondary | ICD-10-CM

## 2017-05-19 MED ORDER — NA SULFATE-K SULFATE-MG SULF 17.5-3.13-1.6 GM/177ML PO SOLN
ORAL | 0 refills | Status: DC
Start: 2017-05-19 — End: 2017-05-26

## 2017-05-19 NOTE — Progress Notes (Signed)
Patient denies any allergies to eggs or soy. Patient denies any problems with anesthesia/sedation. Patient denies any oxygen use at home. Patient denies taking any diet/weight loss medications or blood thinners. EMMI education declined, no email per pt. Patient was instructed not to drink or do any drugs while prepping for the exam.

## 2017-05-26 ENCOUNTER — Encounter: Payer: Self-pay | Admitting: Gastroenterology

## 2017-05-26 ENCOUNTER — Ambulatory Visit (AMBULATORY_SURGERY_CENTER): Payer: Medicare Other | Admitting: Gastroenterology

## 2017-05-26 VITALS — BP 112/79 | HR 65 | Temp 97.5°F | Resp 10 | Ht 65.0 in | Wt 145.0 lb

## 2017-05-26 DIAGNOSIS — Z1211 Encounter for screening for malignant neoplasm of colon: Secondary | ICD-10-CM

## 2017-05-26 DIAGNOSIS — Z1212 Encounter for screening for malignant neoplasm of rectum: Secondary | ICD-10-CM

## 2017-05-26 MED ORDER — SODIUM CHLORIDE 0.9 % IV SOLN: 500.0000 mL | Freq: Once | INTRAVENOUS | Status: DC

## 2017-05-26 NOTE — Op Note (Signed)
[facility] Patient Name: [name] Procedure Date: 05/26/2017 10:47 AM MRN: [id number] Endoscopist: Mallie Mussel L. Loletha Carrow , MD Age: 62 Referring MD:  Date of Birth: 02-02-1956 Gender: Male Account #: [id number] Procedure:                Colonoscopy Indications:              Screening for colorectal malignant neoplasm, This                            is the patient's first colonoscopy Medicines:                Monitored Anesthesia Care Procedure:                Pre-Anesthesia Assessment:                           - Prior to the procedure, a History and Physical                            was performed, and patient medications and                            allergies were reviewed. The patient's tolerance of                            previous anesthesia was also reviewed. The risks                            and benefits of the procedure and the sedation                            options and risks were discussed with the patient.                            All questions were answered, and informed consent                            was obtained. Prior Anticoagulants: The patient has                            taken no previous anticoagulant or antiplatelet                            agents. ASA Grade Assessment: III - A patient with                            severe systemic disease. After reviewing the risks                            and benefits, the patient was deemed in                            satisfactory condition to undergo the procedure.  After obtaining informed consent, the colonoscope                            was passed under direct vision. Throughout the                            procedure, the patient's blood pressure, pulse, and                            oxygen saturations were monitored continuously. The                            Colonoscope was introduced through the anus and                            advanced to the the cecum,  identified by                            appendiceal orifice and ileocecal valve. The                            colonoscopy was performed without difficulty. The                            patient tolerated the procedure well. The quality                            of the bowel preparation was good. The ileocecal                            valve, appendiceal orifice, and rectum were                            photographed. The quality of the bowel preparation                            was evaluated using the BBPS [facility] Bowel                            Preparation Scale) with scores of: Right Colon = 2,                            Transverse Colon = 2 and Left Colon = 2. The total                            BBPS score equals 6 After lavage. The bowel                            preparation used was SUPREP. Scope In: 10:53:55 AM Scope Out: 09:98:33 AM Scope Withdrawal Time: 0 hours 10 minutes 39 seconds  Total Procedure Duration: 0 hours 12 minutes 51 seconds  Findings:                 The perianal exam  findings include a skin tag.                           Multiple small-mouthed diverticula were found in                            the left colon and right colon.                           The exam was otherwise without abnormality on                            direct and retroflexion views. Complications:            No immediate complications. Estimated Blood Loss:     Estimated blood loss: none. Impression:               - Perianal skin tag found on perianal exam.                           - Diverticulosis in the left colon and in the right                            colon.                           - The examination was otherwise normal on direct                            and retroflexion views.                           - No specimens collected. Recommendation:           - Patient has a contact number available for                            emergencies. The signs and symptoms of  potential                            delayed complications were discussed with the                            patient. Return to normal activities tomorrow.                            Written discharge instructions were provided to the                            patient.                           - Resume previous diet.                           - Continue present medications.                           -  Repeat colonoscopy in 10 years for screening                            purposes. Sharol Croghan L. Loletha Carrow, MD 05/26/2017 11:10:44 AM This report has been signed electronically.

## 2017-05-26 NOTE — Progress Notes (Signed)
Pt's states no medical or surgical changes since previsit or office visit. 

## 2017-05-26 NOTE — Patient Instructions (Signed)
Information on diverticulosis given to you today.  YOU HAD AN ENDOSCOPIC PROCEDURE TODAY AT THE [facility]:   Refer to the procedure report that was given to you for any specific questions about what was found during the examination.  If the procedure report does not answer your questions, please call your gastroenterologist to clarify.  If you requested that your care partner not be given the details of your procedure findings, then the procedure report has been included in a sealed envelope for you to review at your convenience later.  YOU SHOULD EXPECT: Some feelings of bloating in the abdomen. Passage of more gas than usual.  Walking can help get rid of the air that was put into your GI tract during the procedure and reduce the bloating. If you had a lower endoscopy (such as a colonoscopy or flexible sigmoidoscopy) you may notice spotting of blood in your stool or on the toilet paper. If you underwent a bowel prep for your procedure, you may not have a normal bowel movement for a few days.  Please Note:  You might notice some irritation and congestion in your nose or some drainage.  This is from the oxygen used during your procedure.  There is no need for concern and it should clear up in a day or so.  SYMPTOMS TO REPORT IMMEDIATELY:   Following lower endoscopy (colonoscopy or flexible sigmoidoscopy):  Excessive amounts of blood in the stool  Significant tenderness or worsening of abdominal pains  Swelling of the abdomen that is new, acute  Fever of 100F or higher   For urgent or emergent issues, a gastroenterologist can be reached at any hour by calling [phone].   DIET:  We do recommend a small meal at first, but then you may proceed to your regular diet.  Drink plenty of fluids but you should avoid alcoholic beverages for 24 hours.  ACTIVITY:  You should plan to take it easy for the rest of today and you should NOT DRIVE or use heavy machinery until tomorrow  (because of the sedation medicines used during the test).    FOLLOW UP: Our staff will call the number listed on your records the next business day following your procedure to check on you and address any questions or concerns that you may have regarding the information given to you following your procedure. If we do not reach you, we will leave a message.  However, if you are feeling well and you are not experiencing any problems, there is no need to return our call.  We will assume that you have returned to your regular daily activities without incident.  If any biopsies were taken you will be contacted by phone or by letter within the next 1-3 weeks.  Please call us at [phone] if you have not heard about the biopsies in 3 weeks.    SIGNATURES/CONFIDENTIALITY: You and/or your care partner have signed paperwork which will be entered into your electronic medical record.  These signatures attest to the fact that that the information above on your After Visit Summary has been reviewed and is understood.  Full responsibility of the confidentiality of this discharge information lies with you and/or your care-partner. 

## 2017-05-26 NOTE — Progress Notes (Signed)
A and O x3. Report to RN. Tolerated MAC anesthesia well.

## 2017-05-27 ENCOUNTER — Telehealth: Payer: Self-pay | Admitting: *Deleted

## 2017-05-27 NOTE — Telephone Encounter (Signed)
  Follow up Call-  Call back number 05/26/2017  Post procedure Call Back phone  # [phone]  Permission to leave phone message Yes  Some recent data might be hidden     Patient questions:  Do you have a fever, pain , or abdominal swelling? No. Pain Score  0 *  Have you tolerated food without any problems? Yes.    Have you been able to return to your normal activities? Yes.    Do you have any questions about your discharge instructions: Diet   No. Medications  No. Follow up visit  No.  Do you have questions or concerns about your Care? No.  Actions: * If pain score is 4 or above: No action needed, pain <4.

## 2017-07-03 ENCOUNTER — Encounter (HOSPITAL_COMMUNITY): Payer: Self-pay | Admitting: Emergency Medicine

## 2017-07-03 ENCOUNTER — Emergency Department (HOSPITAL_COMMUNITY)
Admission: EM | Admit: 2017-07-03 | Discharge: 2017-07-04 | Disposition: A | Discharge status: Home / Self Care | Payer: Medicare Other | Attending: Emergency Medicine | Admitting: Emergency Medicine

## 2017-07-03 DIAGNOSIS — I10 Essential (primary) hypertension: Secondary | ICD-10-CM | POA: Diagnosis not present

## 2017-07-03 DIAGNOSIS — Z7984 Long term (current) use of oral hypoglycemic drugs: Secondary | ICD-10-CM | POA: Diagnosis not present

## 2017-07-03 DIAGNOSIS — Z79899 Other long term (current) drug therapy: Secondary | ICD-10-CM | POA: Diagnosis not present

## 2017-07-03 DIAGNOSIS — R569 Unspecified convulsions: Secondary | ICD-10-CM

## 2017-07-03 DIAGNOSIS — E119 Type 2 diabetes mellitus without complications: Secondary | ICD-10-CM | POA: Diagnosis not present

## 2017-07-03 DIAGNOSIS — F17210 Nicotine dependence, cigarettes, uncomplicated: Secondary | ICD-10-CM | POA: Insufficient documentation

## 2017-07-03 DIAGNOSIS — G40209 Localization-related (focal) (partial) symptomatic epilepsy and epileptic syndromes with complex partial seizures, not intractable, without status epilepticus: Secondary | ICD-10-CM | POA: Insufficient documentation

## 2017-07-03 LAB — BASIC METABOLIC PANEL
ANION GAP: 10 (ref 5–15)
Anion gap: 17 — ABNORMAL HIGH (ref 5–15)
BUN: 9 mg/dL (ref 6–20)
BUN: 11 mg/dL (ref 6–20)
CHLORIDE: 98 mmol/L — AB (ref 101–111)
CO2: 21 mmol/L — ABNORMAL LOW (ref 22–32)
CO2: 24 mmol/L (ref 22–32)
Calcium: 8.8 mg/dL — ABNORMAL LOW (ref 8.9–10.3)
Calcium: 8.5 mg/dL — ABNORMAL LOW (ref 8.9–10.3)
Chloride: 105 mmol/L (ref 101–111)
Creatinine, Ser: 0.8 mg/dL (ref 0.61–1.24)
Creatinine, Ser: 0.83 mg/dL (ref 0.61–1.24)
GFR calc Af Amer: >60 mL/min (ref >60)
GLUCOSE: 73 mg/dL (ref 65–99)
Glucose, Bld: 86 mg/dL (ref 65–99)
POTASSIUM: 3.6 mmol/L (ref 3.5–5.1)
POTASSIUM: 3.8 mmol/L (ref 3.5–5.1)
SODIUM: 136 mmol/L (ref 135–145)
Sodium: 139 mmol/L (ref 135–145)

## 2017-07-03 LAB — CBC WITH DIFFERENTIAL/PLATELET
BASOS ABS: 0 10*3/uL (ref 0.0–0.1)
Basophils Relative: 0 %
EOS ABS: 0.2 10*3/uL (ref 0.0–0.7)
Eosinophils Relative: 3 %
HCT: 41.9 % (ref 39.0–52.0)
HEMOGLOBIN: 14.4 g/dL (ref 13.0–17.0)
LYMPHS ABS: 1.9 10*3/uL (ref 0.7–4.0)
LYMPHS PCT: 27 %
MCH: 30.8 pg (ref 26.0–34.0)
MCHC: 34.4 g/dL (ref 30.0–36.0)
MCV: 89.7 fL (ref 78.0–100.0)
Monocytes Absolute: 0.6 10*3/uL (ref 0.1–1.0)
Monocytes Relative: 9 %
NEUTROS PCT: 61 %
Neutro Abs: 4.1 10*3/uL (ref 1.7–7.7)
Platelets: 240 10*3/uL (ref 150–400)
RBC: 4.67 MIL/uL (ref 4.22–5.81)
RDW: 14.9 % (ref 11.5–15.5)
WBC: 6.8 10*3/uL (ref 4.0–10.5)

## 2017-07-03 LAB — PHENYTOIN LEVEL, TOTAL: PHENYTOIN LVL: 7.3 ug/mL — AB (ref 10.0–20.0)

## 2017-07-03 LAB — VALPROIC ACID LEVEL: Valproic Acid Lvl: <10 ug/mL — ABNORMAL LOW (ref 50.0–100.0)

## 2017-07-03 LAB — ETHANOL: Alcohol, Ethyl (B): 14 mg/dL — ABNORMAL HIGH (ref <10)

## 2017-07-03 MED ORDER — LEVETIRACETAM IN NACL 500 MG/100ML IV SOLN
500.0000 mg | Freq: Once | INTRAVENOUS | Status: AC
  Administered 2017-07-03: 500 mg via INTRAVENOUS
  Filled 2017-07-03: qty 100

## 2017-07-03 MED ORDER — VALPROATE SODIUM 500 MG/5ML IV SOLN
1000.0000 mg | Freq: Once | INTRAVENOUS | Status: AC
  Administered 2017-07-03: 1000 mg via INTRAVENOUS
  Filled 2017-07-03: qty 10

## 2017-07-03 MED ORDER — LORAZEPAM 2 MG/ML IJ SOLN
INTRAMUSCULAR | Status: AC
  Administered 2017-07-03: 18:00:00
  Filled 2017-07-03: qty 1

## 2017-07-03 MED ORDER — SODIUM CHLORIDE 0.9 % IV BOLUS (SEPSIS)
1000.0000 mL | Freq: Once | INTRAVENOUS | Status: AC
  Administered 2017-07-03: 1000 mL via INTRAVENOUS

## 2017-07-03 NOTE — ED Provider Notes (Signed)
[facility] EMERGENCY DEPARTMENT Provider Note   CSN: [id number] Arrival date & time: 07/03/17  1733     History   Chief Complaint Chief Complaint  Patient presents with  . Seizures    HPI [name] is a 62 y.o. male.  HPI 62 year old African-American male past medical history significant for seizure disorder currently on medication, diabetes, epilepsy, blindness of right eye that presents to the emergency department today for evaluation of seizure.  The patient called EMS earlier this afternoon because he was developing an aura with some lights flashing that is typical of his prodromal seizure activity.  Patient called EMS and was transported to the ED for evaluation.  While on the EMS stretcher in the ED patient did have a tonic-clonic seizure that was witnessed by staff.  Patient was given 2 mg of Ativan as discussed with attending.  On my assessment patient was sedated from the Ativan.  Level 5 caveat due to mental status secondary to ativan and postictal state.  Patient states he has been drinking alcohol today with history of chronic alcohol abuse.  Patient states that when he drinks it does lower his seizure threshold.  Patient reports it has been a while since his last seizure.  States that he has been taking his medications as prescribed including Depakote, Keppra and Dilantin. Past Medical History:  Diagnosis Date  . Blindness of right eye   . Diabetes mellitus   . Epilepsy ([place])    As reported by patient  . Seizures ([place])    since age 31;last seizure 3-4 years ago per pt  . Stroke [facility]) 2 years ago    Patient Active Problem List   Diagnosis Date Noted  . Leukocytosis 01/24/2017  . Overdose 01/24/2017  . Hypertension 01/24/2017  . Tachycardia 01/24/2017  . Back pain 01/24/2017  . Sciatica of right side 04/30/2016  . Fever 04/30/2016  . Localization-related symptomatic epilepsy and epileptic syndromes with complex partial seizures, not  intractable, without status epilepticus ([place]) 01/29/2015  . Blindness of right eye   . LUNG ABSCESS 03/14/2008    Past Surgical History:  Procedure Laterality Date  . NO PAST SURGERIES         Home Medications    Prior to Admission medications   Medication Sig Start Date End Date Taking? Authorizing Provider  acetaminophen (TYLENOL) 325 MG tablet Take 2 tablets (650 mg total) by mouth every 6 (six) hours as needed for mild pain. 05/02/16   Arrien, Jimmy Picket, MD  divalproex (DEPAKOTE ER) 500 MG 24 hr tablet Take 4 tablets (2,000 mg total) by mouth 2 (two) times daily. 03/17/17   Pieter Partridge, DO  levETIRAcetam (KEPPRA) 750 MG tablet Take 1 tablet (750 mg total) by mouth 2 (two) times daily. 03/17/17   Pieter Partridge, DO  metFORMIN (GLUCOPHAGE) 500 MG tablet Take 500 mg by mouth 2 (two) times daily with a meal.  01/27/16   [provider]  phenytoin (DILANTIN) 100 MG ER capsule Take 2 capsules (200 mg total) by mouth every morning. Patient taking differently: Take 100 mg by mouth every morning.  03/17/17   Pieter Partridge, DO  phenytoin (DILANTIN) 50 MG tablet Chew 1 tablet (50 mg total) by mouth every evening. 03/17/17   Pieter Partridge, DO    Family History Family History  Problem Relation Age of Onset  . Diabetes Mother   . Hypertension Mother   . Colon cancer Neg Hx  Social History Social History   Tobacco Use  . Smoking status: Current Every Day Smoker    Packs/day: 0.25    Types: Cigarettes  . Smokeless tobacco: Never Used  Substance Use Topics  . Alcohol use: Yes    Comment: one 40 oz beer each day  . Drug use: Yes    Types: Cocaine    Comment: cocaine--weekly per pt; last time couple days ago     Allergies   Patient has no known allergies.   Review of Systems Review of Systems  Unable to perform ROS: Mental status change (ativan and postical )     Physical Exam Updated Vital Signs BP (!) 138/93   Pulse 86   Temp 99.2 F (37.3 C)  (Oral)   Resp 18   SpO2 97%   Physical Exam  Constitutional: He is oriented to person, place, and time. He appears well-developed and well-nourished. No distress.  HENT:  Head: Normocephalic and atraumatic.  Eyes: Conjunctivae are normal. Pupils are equal, round, and reactive to light. Right eye exhibits no discharge. Left eye exhibits no discharge. No scleral icterus.  Patient does have some crusting to the right eyelid with out discharge, does not complain of pain or discharge.  Neck: Normal range of motion.  Cardiovascular: Normal rate, regular rhythm, normal heart sounds and intact distal pulses. Exam reveals no gallop and no friction rub.  No murmur heard. Pulmonary/Chest: Effort normal and breath sounds normal. No stridor. No respiratory distress. He has no wheezes. He has no rales. He exhibits no tenderness.  Abdominal: Soft. Bowel sounds are normal. He exhibits no distension and no mass. There is no tenderness. There is no rebound and no guarding.  Musculoskeletal: Normal range of motion.  Neurological: He is alert and oriented to person, place, and time.  Sleeping on my examination however he does respond to verbal stimuli.  Follows commands appropriately.  Alert and oriented x3.  Skin: Skin is warm and dry. Capillary refill takes less than 2 seconds. No pallor.  Psychiatric: His behavior is normal. Judgment and thought content normal.  Nursing note and vitals reviewed.    ED Treatments / Results  Labs (all labs ordered are listed, but only abnormal results are displayed) Labs Reviewed  BASIC METABOLIC PANEL - Abnormal; Notable for the following components:      Result Value   Chloride 98 (*)    CO2 21 (*)    Calcium 8.8 (*)    Anion gap 17 (*)    All other components within normal limits  PHENYTOIN LEVEL, TOTAL - Abnormal; Notable for the following components:   Phenytoin Lvl 7.3 (*)    All other components within normal limits  VALPROIC ACID LEVEL - Abnormal; Notable  for the following components:   Valproic Acid Lvl <10 (*)    All other components within normal limits  ETHANOL - Abnormal; Notable for the following components:   Alcohol, Ethyl (B) 14 (*)    All other components within normal limits  BASIC METABOLIC PANEL - Abnormal; Notable for the following components:   Calcium 8.5 (*)    All other components within normal limits  CBC WITH DIFFERENTIAL/PLATELET  LEVETIRACETAM LEVEL    EKG  EKG Interpretation None       Radiology No results found.  Procedures Procedures (including critical care time)  Medications Ordered in ED Medications  valproate (DEPACON) 1,000 mg in dextrose 5 % 50 mL IVPB (1,000 mg Intravenous New Bag/Given 07/03/17 2304)  LORazepam (  ATIVAN) 2 MG/ML injection (  Given 07/03/17 1745)  levETIRAcetam (KEPPRA) IVPB 500 mg/100 mL premix (0 mg Intravenous Stopped 07/03/17 2106)  sodium chloride 0.9 % bolus 1,000 mL (0 mLs Intravenous Stopped 07/03/17 2106)     Initial Impression / Assessment and Plan / ED Course  I have reviewed the triage vital signs and the nursing notes.  Pertinent labs & imaging results that were available during my care of the patient were reviewed by me and considered in my medical decision making (see chart for details).     Patient presents to the ED by EMS for patient said was the or he gets before a seizure.  While in the ED patient did have a tonic-clonic seizure that was witnessed by staff and attending who ordered Ativan for patient.  On my assessment patient was postictal and sedated from Ativan.  On repeat assessment patient is much more awake.  Alert oriented x3.  Responds to verbal stimuli.  Vital signs are reassuring.  Patient is nonseptic or toxic appearing.  Vital signs are reassuring.  Patient is afebrile.  No hypotension or tachycardia noted.  Somewhat several hours in the ED without any signs of further seizure-like activity following Ativan earlier in triage.  Following the seizure  patient had a slight anion gap of 17.  Patient given fluids.  Repeat BMP was unremarkable.  CBC was unremarkable for any acute findings including normal white count and hemoglobin.  Ethanol was 14.  Patient's Depakote level is less than 10 and phenytoin level is 7.3.  Keppra level is pending at this time.  Patient states he is taking the medication as prescribed however given that the lab work have suspicion that he is not taking it appropriately.  Patient given Depakote and Keppra load in the ED.  Encouraged follow-up PCP and neurology.  Patient was reassessed several hours afterwards and is ambulating with normal gait.  Alert and oriented.  States he is ready to leave.  Has no complaints at this time.  Will follow with primary care and neurology.  Patient tolerating p.o. fluids appropriately.  Pt is hemodynamically stable, in NAD, & able to ambulate in the ED. Evaluation does not show pathology that would require ongoing emergent intervention or inpatient treatment. I explained the diagnosis to the patient. Pain has been managed & has no complaints prior to dc. Pt is comfortable with above plan and is stable for discharge at this time. All questions were answered prior to disposition. Strict return precautions for f/u to the ED were discussed. Encouraged follow up with PCP.  Pt seen and eval by my attending who is agreeable with the above plan.   Final Clinical Impressions(s) / ED Diagnoses   Final diagnoses:  Seizure [facility])       Doristine Devoid, PA-C 07/04/17 0017    Davonna Belling, MD 07/04/17 [phone]

## 2017-07-03 NOTE — ED Triage Notes (Signed)
Pt here with c/o seizure , pt had seizure on ems stretcher in the hallway , pt was given 2 mg ativan and placed on NRB

## 2017-07-04 MED ORDER — ERYTHROMYCIN 5 MG/GM OP OINT: TOPICAL_OINTMENT | OPHTHALMIC | 0 refills | Status: DC

## 2017-07-04 NOTE — ED Notes (Signed)
Patient provided with food/fluids and tolerated well. Patient walked to bathroom unassisted.

## 2017-07-04 NOTE — Discharge Instructions (Signed)
Please make you follow-up with your primary care doctor and your neurologist.  Return the ED with any worsening symptoms.

## 2017-07-05 LAB — LEVETIRACETAM LEVEL: Levetiracetam Lvl: 1.5 ug/mL — ABNORMAL LOW (ref 10.0–40.0)

## 2017-09-13 ENCOUNTER — Telehealth: Payer: Self-pay

## 2017-09-13 NOTE — Telephone Encounter (Signed)
Incoming referral from [facility]. Referral for rectal abscess. Per Dr. Loletha Carrow review of records, pt needs to see a surgeon. [facility] has been notified and aware. She will inform Dr Eula Fried.

## 2018-01-21 ENCOUNTER — Other Ambulatory Visit: Payer: Self-pay | Admitting: Neurology

## 2018-02-16 ENCOUNTER — Other Ambulatory Visit: Payer: Self-pay | Admitting: Neurology

## 2018-03-07 ENCOUNTER — Other Ambulatory Visit: Payer: Self-pay | Admitting: Neurology

## 2018-03-08 NOTE — Progress Notes (Signed)
NEUROLOGY FOLLOW UP OFFICE NOTE  [name] [id number]  HISTORY OF PRESENT ILLNESS: [name] is a 62 year old right-handed male with diabetes mellitus and right eye blindness who follows up for localization-related epilepsy.  UPDATE: Medications: Depakote ER 2000 mg twice daily, Keppra 750 mg twice daily, Dilantin 200 mg in a.m. and 50 mg in p.m.  He presented to the ED at [facility] on 07/03/2017 because he began experiencing his habitual aura prior to his seizure, specifically flashing lights.  He called EMS and was transported to the hospital.  While on the stretcher, he did have a tonic-clonic seizure.  He was given 2 mg of Ativan.  He reportedly had been drinking alcohol.  He reported medication compliance.  However, valproic acid level was undetectable and Keppra level was low at 1.5.  Dilantin level was 7.3.  EtOH level was 14.  Basic metabolic panel showed a slight anion gap of 17.  CBC was unremarkable.  He was discharged once stable.  No other spells since then.  He maintains medication compliance.  He drinks 7 cans of beer a day.    HISTORY: He began having seizures at age 30. It may have been precipitated by a head injury after falling out of his crib.   Semiology: Aura of colored lights in right visual field. It may last several minutes. Usually the warning of the aura gives him time to lay down and call the ambulance. Sometimes it resolves, while other times it progresses to a full-blown seizure, described as loss of consciousness, generalized jerking and tongue biting. He averages approximately 1-2 seizures per year. But he says last seizure was around 1 to 1.5 years ago.  He takes Depakote ER 2000mg  twice daily, phenytoin 200mg  in AM and 50mg  at night, Keppra 750mg  twice daily.  He is taking calcium and vitamin D.  Prior medication: phenobarbital. Higher doses of Dilantin caused ataxia.  09/20/09 MRI Brain w/o: mild generalized atrophy with  prominence to the cerebellum. Post-operative changes of right globe.  PAST MEDICAL HISTORY: Past Medical History:  Diagnosis Date  . Blindness of right eye   . Diabetes mellitus   . Epilepsy ([place])    As reported by patient  . Seizures ([place])    since age 51;last seizure 3-4 years ago per pt  . Stroke [facility]) 2 years ago    MEDICATIONS: Current Outpatient Medications on File Prior to Visit  Medication Sig Dispense Refill  . acetaminophen (TYLENOL) 325 MG tablet Take 2 tablets (650 mg total) by mouth every 6 (six) hours as needed for mild pain. 30 tablet 0  . divalproex (DEPAKOTE ER) 500 MG 24 hr tablet TAKE 4 TABLETS (2,000 MG TOTAL) BY MOUTH 2 (TWO) TIMES DAILY. 240 tablet 0  . levETIRAcetam (KEPPRA) 750 MG tablet TAKE 1 TABLET (750 MG TOTAL) BY MOUTH 2 (TWO) TIMES DAILY. 60 tablet 3  . metFORMIN (GLUCOPHAGE) 500 MG tablet Take 500 mg by mouth 2 (two) times daily with a meal.     . phenytoin (DILANTIN) 100 MG ER capsule TAKE 2 (TWO) CAPSULES BY MOUTH IN THE MORNING 60 capsule 0  . PHENYTOIN INFATABS 50 MG tablet CHEW 1 TABLET (50 MG TOTAL) BY MOUTH EVERY EVENING. 30 tablet 0   Current Facility-Administered Medications on File Prior to Visit  Medication Dose Route Frequency Provider Last Rate Last Dose  . 0.9 %  sodium chloride infusion  500 mL Intravenous Once Doran Stabler, MD  ALLERGIES: No Known Allergies  FAMILY HISTORY: Family History  Problem Relation Age of Onset  . Diabetes Mother   . Hypertension Mother   . Colon cancer Neg Hx    SOCIAL HISTORY: Social History   Socioeconomic History  . Marital status: Single    Spouse name: Not on file  . Number of children: Not on file  . Years of education: Not on file  . Highest education level: Not on file  Occupational History  . Not on file  Social Needs  . Financial resource strain: Not on file  . Food insecurity:    Worry: Not on file    Inability: Not on file  . Transportation needs:    Medical: Not  on file    Non-medical: Not on file  Tobacco Use  . Smoking status: Current Every Day Smoker    Packs/day: 0.25    Types: Cigarettes  . Smokeless tobacco: Never Used  Substance and Sexual Activity  . Alcohol use: Yes    Comment: one 40 oz beer each day  . Drug use: Yes    Types: Cocaine    Comment: cocaine--weekly per pt; last time couple days ago  . Sexual activity: Never  Lifestyle  . Physical activity:    Days per week: Not on file    Minutes per session: Not on file  . Stress: Not on file  Relationships  . Social connections:    Talks on phone: Not on file    Gets together: Not on file    Attends religious service: Not on file    Active member of club or organization: Not on file    Attends meetings of clubs or organizations: Not on file    Relationship status: Not on file  . Intimate partner violence:    Fear of current or ex partner: Not on file    Emotionally abused: Not on file    Physically abused: Not on file    Forced sexual activity: Not on file  Other Topics Concern  . Not on file  Social History Narrative  . Not on file    REVIEW OF SYSTEMS: Constitutional: No fevers, chills, or sweats, no generalized fatigue, change in appetite Eyes: No visual changes, double vision, eye pain Ear, nose and throat: No hearing loss, ear pain, nasal congestion, sore throat Cardiovascular: No chest pain, palpitations Respiratory:  No shortness of breath at rest or with exertion, wheezes GastrointestinaI: No nausea, vomiting, diarrhea, abdominal pain, fecal incontinence Genitourinary:  No dysuria, urinary retention or frequency Musculoskeletal:  No neck pain, back pain Integumentary: No rash, pruritus, skin lesions Neurological: as above Psychiatric: No depression, insomnia, anxiety Endocrine: No palpitations, fatigue, diaphoresis, mood swings, change in appetite, change in weight, increased thirst Hematologic/Lymphatic:  No purpura, petechiae. Allergic/Immunologic: no  itchy/runny eyes, nasal congestion, recent allergic reactions, rashes  PHYSICAL EXAM: Blood pressure 122/74, pulse 74, height 5\' 5"  (1.651 m), weight 142 lb (64.4 kg), SpO2 98 %. General: No acute distress.  Patient appears well-groomed.  Head:  Normocephalic/atraumatic Eyes:  Fundi examined but not visualized Neck: supple, no paraspinal tenderness, full range of motion Heart:  Regular rate and rhythm Lungs:  Clear to auscultation bilaterally Back: No paraspinal tenderness Neurological Exam: alert and oriented to person, place, and time. Attention span and concentration intact, recent and remote memory intact, fund of knowledge intact.  Speech fluent and not dysarthric, language intact.  Right eye blindness.  Otherwise, CN II-XII intact. Bulk and tone normal, muscle strength  5/5 throughout.  Sensation to light touch intact.  Deep tendon reflexes 2+ throughout, toes downgoing.  Finger to nose and heel to shin testing intact.  Gait normal, Romberg negative.  IMPRESSION: 1.  Localization-related epilepsy with secondary generalization.  He had a seizure back in March which was due to subtherapeutic levels of his antiepileptic medications due to medication noncompliance.  Alcohol use was also a factor. 2.  Medication noncompliance 3.  Alcohol abuse  PLAN: 1.  Continue Depakote ER 2000 mg twice daily, Keppra 750 mg twice daily, and Dilantin 200 mg in a.m. and 50 mg in p.m.   2.  Check CBC, CMP, valproic acid level, levetiracetam level, and phenytoin level today. 3.  Discussed extensively the importance of medication compliance and the dangers of recurrent seizures. 4.  Advised to reduce drinking alcohol 5.  Tobacco cessation counseling (CPT 99406):  Tobacco use with no history of CAD, stroke, or cancer  - Currently smoking 0.25 packs/day   - Patient was informed of the dangers of tobacco abuse including stroke, cancer, and MI, as well as benefits of tobacco cessation. - Patient is not willing  to quit at this time. - Approximately 4 mins were spent counseling patient cessation techniques. We discussed various methods to help quit smoking, including deciding on a date to quit, joining a support group, pharmacological agents- nicotine gum/patch/lozenges, chantix.  - I will reassess his progress at the next follow-up visit 6.  Follow up in one year.  15 minutes spent face to face with patient, over 50% spent discussing management.  Metta Clines, DO  CC: Collins Scotland, NP

## 2018-03-09 ENCOUNTER — Encounter: Payer: Self-pay | Admitting: Neurology

## 2018-03-09 ENCOUNTER — Other Ambulatory Visit (INDEPENDENT_AMBULATORY_CARE_PROVIDER_SITE_OTHER): Payer: Medicare HMO

## 2018-03-09 ENCOUNTER — Ambulatory Visit (INDEPENDENT_AMBULATORY_CARE_PROVIDER_SITE_OTHER): Payer: Medicare HMO | Admitting: Neurology

## 2018-03-09 VITALS — BP 122/74 | HR 74 | Ht 65.0 in | Wt 142.0 lb

## 2018-03-09 DIAGNOSIS — F17200 Nicotine dependence, unspecified, uncomplicated: Secondary | ICD-10-CM

## 2018-03-09 DIAGNOSIS — F1010 Alcohol abuse, uncomplicated: Secondary | ICD-10-CM | POA: Diagnosis not present

## 2018-03-09 DIAGNOSIS — Z79899 Other long term (current) drug therapy: Secondary | ICD-10-CM

## 2018-03-09 DIAGNOSIS — F17210 Nicotine dependence, cigarettes, uncomplicated: Secondary | ICD-10-CM

## 2018-03-09 DIAGNOSIS — G40209 Localization-related (focal) (partial) symptomatic epilepsy and epileptic syndromes with complex partial seizures, not intractable, without status epilepticus: Secondary | ICD-10-CM

## 2018-03-09 LAB — CBC
HEMATOCRIT: 43.8 % (ref 39.0–52.0)
HEMOGLOBIN: 14.7 g/dL (ref 13.0–17.0)
MCHC: 33.6 g/dL (ref 30.0–36.0)
MCV: 92.3 fl (ref 78.0–100.0)
Platelets: 224 10*3/uL (ref 150.0–400.0)
RBC: 4.75 Mil/uL (ref 4.22–5.81)
RDW: 14.4 % (ref 11.5–15.5)
WBC: 7.9 10*3/uL (ref 4.0–10.5)

## 2018-03-09 LAB — BASIC METABOLIC PANEL
BUN: 23 mg/dL (ref 6–23)
CALCIUM: 9.2 mg/dL (ref 8.4–10.5)
CO2: 30 mEq/L (ref 19–32)
CREATININE: 0.89 mg/dL (ref 0.40–1.50)
Chloride: 105 mEq/L (ref 96–112)
GFR: 111.22 mL/min (ref >60.00)
Glucose, Bld: 89 mg/dL (ref 70–99)
Potassium: 3.9 mEq/L (ref 3.5–5.1)
Sodium: 142 mEq/L (ref 135–145)

## 2018-03-09 NOTE — Patient Instructions (Addendum)
1.  Continue Depakote ER 2000mg  twice daily, levetiracetam 750mg  twice daily and phenytoin 200mg  in morning and 50mg  at night. 2.  We will check some blood work 3.  Try to quit smoking and decreasing drinking 4.  Follow up in one year

## 2018-03-10 LAB — VALPROIC ACID LEVEL: VALPROIC ACID LVL: 86.3 mg/L (ref 50.0–100.0)

## 2018-03-12 LAB — PHENYTOIN LEVEL, TOTAL: PHENYTOIN, TOTAL: 9.6 mg/L — AB (ref 10.0–20.0)

## 2018-03-12 LAB — LEVETIRACETAM LEVEL: KEPPRA (LEVETIRACETAM): 30.1 ug/mL (ref 12.0–46.0)

## 2018-05-02 ENCOUNTER — Other Ambulatory Visit: Payer: Self-pay

## 2018-05-02 ENCOUNTER — Other Ambulatory Visit: Payer: Self-pay | Admitting: Neurology

## 2018-05-02 MED ORDER — PHENYTOIN 50 MG PO CHEW: 50.0000 mg | CHEWABLE_TABLET | Freq: Every day | ORAL | 6 refills | Status: DC

## 2018-05-02 NOTE — Telephone Encounter (Signed)
Pt called about seizure medications. He stated the pharmacy had called and we had not yet refilled his medications. I advised him we had just received the request within the last 15 minutes and I will refill all his seizure medications.

## 2018-06-07 ENCOUNTER — Telehealth: Payer: Self-pay | Admitting: Neurology

## 2018-06-07 ENCOUNTER — Other Ambulatory Visit: Payer: Self-pay

## 2018-06-07 DIAGNOSIS — G40209 Localization-related (focal) (partial) symptomatic epilepsy and epileptic syndromes with complex partial seizures, not intractable, without status epilepticus: Secondary | ICD-10-CM

## 2018-06-07 MED ORDER — DIVALPROEX SODIUM ER 500 MG PO TB24: 2000.0000 mg | ORAL_TABLET | Freq: Every day | ORAL | 6 refills | Status: DC

## 2018-06-07 MED ORDER — DIVALPROEX SODIUM ER 500 MG PO TB24: 2000.0000 mg | ORAL_TABLET | Freq: Two times a day (BID) | ORAL | 6 refills | Status: DC

## 2018-06-07 MED ORDER — PHENYTOIN SODIUM EXTENDED 100 MG PO CAPS: 200.0000 mg | ORAL_CAPSULE | ORAL | 6 refills | Status: DC

## 2018-06-07 NOTE — Telephone Encounter (Signed)
1) Medication(s) Requested (by name): Dilantin 100 mg & Depakote  2) Pharmacy of Choice: [facility].  3) Special Requests:  Pt states he has no phone for [place] to call him.   Approved medications will be sent to the pharmacy, we will reach out if there is an issue.  Requests made after 3pm may not be addressed until the following business day!  If a patient is unsure of the name of the medication(s) please note and ask patient to call back when they are able to provide all info, do not send to responsible party until all information is available!

## 2018-08-05 ENCOUNTER — Other Ambulatory Visit: Payer: Self-pay | Admitting: Neurology

## 2018-10-04 ENCOUNTER — Other Ambulatory Visit: Payer: Self-pay | Admitting: Pediatric Intensive Care

## 2018-10-04 DIAGNOSIS — Z20822 Contact with and (suspected) exposure to covid-19: Secondary | ICD-10-CM

## 2018-10-04 NOTE — Progress Notes (Signed)
lab

## 2018-10-06 LAB — NOVEL CORONAVIRUS, NAA: SARS-CoV-2, NAA: NOT DETECTED

## 2018-10-13 ENCOUNTER — Telehealth: Payer: Self-pay | Admitting: Family Medicine

## 2018-10-13 NOTE — Telephone Encounter (Signed)
Pt called to obtain his covid 19 results. Not negative was given to pt

## 2018-12-02 ENCOUNTER — Other Ambulatory Visit: Payer: Self-pay | Admitting: Neurology

## 2018-12-02 DIAGNOSIS — G40209 Localization-related (focal) (partial) symptomatic epilepsy and epileptic syndromes with complex partial seizures, not intractable, without status epilepticus: Secondary | ICD-10-CM

## 2019-02-17 ENCOUNTER — Other Ambulatory Visit: Payer: Self-pay

## 2019-02-17 DIAGNOSIS — Z20822 Contact with and (suspected) exposure to covid-19: Secondary | ICD-10-CM

## 2019-02-19 LAB — NOVEL CORONAVIRUS, NAA: SARS-CoV-2, NAA: NOT DETECTED

## 2019-03-13 NOTE — Progress Notes (Deleted)
Virtual Visit via Telephone Note The purpose of this virtual visit is to provide medical care while limiting exposure to the novel coronavirus.    Consent was obtained for phone visit:  Yes.   Answered questions that patient had about telehealth interaction:  Yes.   I discussed the limitations, risks, security and privacy concerns of performing an evaluation and management service by telephone. I also discussed with the patient that there may be a patient responsible charge related to this service. The patient expressed understanding and agreed to proceed.  Pt location: Home Physician Location: Home Name of referring provider:  [facility], Dustin A* I connected with .[name] at patients initiation/request on 03/15/2019 at 10:50 AM EST by telephone and verified that I am speaking with the correct person using two identifiers.  Pt MRN:  [name] Pt DOB:  06-22-1955   History of Present Illness:  [name] is a 63 year old right-handed male with diabetes mellitus and right eye blindness who follows up for localization-related epilepsy.  UPDATE: Medications: Depakote ER 2000 mg twice daily, Keppra 750 mg twice daily, Dilantin 200 mg in a.m. and 50 mg in p.m.  ***   HISTORY: He began having seizures at age 48.It may have been precipitated by a head injury after falling out of his crib.  Semiology:Aura of colored lights in right visual field.It may last several minutes.Usually the warning of the aura gives him time to lay down and call the ambulance.Sometimes it resolves, while other times it progresses to a full-blown seizure, described as loss of consciousness, generalized jerking and tongue biting.He averages approximately 1-2 seizures per year.But he says last seizure was around 1 to 1.5 years ago.  He presented to the ED at [facility] on 07/03/2017 because he began experiencing his habitual aura prior to his seizure, specifically flashing lights.   He called EMS and was transported to the hospital.  While on the stretcher, he did have a tonic-clonic seizure.  He was given 2 mg of Ativan.  He reportedly had been drinking alcohol.  He reported medication compliance.  However, valproic acid level was undetectable and Keppra level was low at 1.5.  Dilantin level was 7.3.  EtOH level was 14.  Basic metabolic panel showed a slight anion gap of 17.  CBC was unremarkable.  He was discharged once stable  He takes Depakote ER 2000mg  twice daily, phenytoin 200mg  in AM and 50mg  at night, Keppra 750mg  twice daily. He is taking calcium and vitamin D.  Prior medication:phenobarbital.Higher doses of Dilantin caused ataxia.  09/20/09 MRI Brain w/o:mild generalized atrophy with prominence to the cerebellum.Post-operative changes of right globe.    Observations/Objective:  ***   Assessment and Plan:   1.  Localization-related epilepsy with secondary generalization. 2.  Alcohol abuse  1.  Depakote ER 2000 mg twice daily, Keppra 750 mg twice daily, Dilantin 200 mg in a.m. and 50 mg in p.m. 2.  Check CBC, CMP, valproic acid level, levetiracetam level, and phenytoin level. 3.  Follow-up in 1 year or as needed.   Follow Up Instructions:    -I discussed the assessment and treatment plan with the patient. The patient was provided an opportunity to ask questions and all were answered. The patient agreed with the plan and demonstrated an understanding of the instructions.   The patient was advised to call back or seek an in-person evaluation if the symptoms worsen or if the condition fails to improve as anticipated.    Total  Time spent in visit with the patient was:  ***  Dudley Major, DO

## 2019-03-15 ENCOUNTER — Telehealth: Payer: Medicare HMO | Admitting: Neurology

## 2019-03-15 ENCOUNTER — Other Ambulatory Visit: Payer: Self-pay

## 2019-03-16 ENCOUNTER — Telehealth: Payer: Self-pay | Admitting: Neurology

## 2019-03-16 DIAGNOSIS — G40209 Localization-related (focal) (partial) symptomatic epilepsy and epileptic syndromes with complex partial seizures, not intractable, without status epilepticus: Secondary | ICD-10-CM

## 2019-03-16 MED ORDER — DIVALPROEX SODIUM ER 500 MG PO TB24: ORAL_TABLET | ORAL | 6 refills | Status: DC

## 2019-03-16 MED ORDER — PHENYTOIN 50 MG PO CHEW: 50.0000 mg | CHEWABLE_TABLET | Freq: Every day | ORAL | 6 refills | Status: DC

## 2019-03-16 MED ORDER — LEVETIRACETAM 750 MG PO TABS: 750.0000 mg | ORAL_TABLET | Freq: Two times a day (BID) | ORAL | 3 refills | Status: DC

## 2019-03-16 NOTE — Telephone Encounter (Signed)
Requested Prescriptions   Pending Prescriptions Disp Refills  . levETIRAcetam (KEPPRA) 750 MG tablet 60 tablet 3    Sig: Take 1 tablet (750 mg total) by mouth 2 (two) times daily.  . divalproex (DEPAKOTE ER) 500 MG 24 hr tablet 240 tablet 6    Sig: TAKE 4 TABLETS BY MOUTH 2 (TWO) TIMES DAILY.  Marland Kitchen phenytoin (DILANTIN) 50 MG tablet 30 tablet 6    Sig: Chew 1 tablet (50 mg total) by mouth at bedtime.   Rx last filled: 12/02/18 #240 6 refill 12/02/18 #60 3 refills 02/17/18 #30 0 refills   Pt last seen:03/09/18  Follow up appt scheduled:03/28/19

## 2019-03-16 NOTE — Telephone Encounter (Signed)
Rx sent 

## 2019-03-16 NOTE — Telephone Encounter (Signed)
Patient came into the office today to confirm his next telephone visit on 04/17/19 with Dr. Tomi Likens.   He also said he needs refills on: Depakote ER 500 MG, Keppra 750 MG, Dilantin 100 MG ER, and Dilantin 50 MG. He said his phone is not working today so that's why he came in to request the refills.  [facility]

## 2019-04-14 ENCOUNTER — Encounter: Payer: Self-pay | Admitting: Neurology

## 2019-04-16 NOTE — Progress Notes (Signed)
Virtual Visit via Telephone Note The purpose of this virtual visit is to provide medical care while limiting exposure to the novel coronavirus.    Consent was obtained for phone visit:  Yes Answered questions that patient had about telehealth interaction:  Yes I discussed the limitations, risks, security and privacy concerns of performing an evaluation and management service by telephone. I also discussed with the patient that there may be a patient responsible charge related to this service. The patient expressed understanding and agreed to proceed.  Pt location: Home Physician Location: office Name of referring provider:  Blunt-Evans, Lachelle A* I connected with .[name] at patients initiation/request on 04/17/2019 at  2:50 PM EST by telephone and verified that I am speaking with the correct person using two identifiers.  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  UPDATE: Medications: Depakote ER 2000 mg twice daily, Keppra 750 mg twice daily, Dilantin 200 mg in a.m. and 50 mg in p.m; calcium and vitamin D  Last seizure:  March 2019 (in setting of medication noncompliance).  He reports one 60 second "aura" that occurred last week.  He says he has been compliant with his medications.  He cut down on alcohol intake.  He says he now drinks 1 to 2 cans of beer daily.   HISTORY: He began having seizures at age 46.It may have been precipitated by a head injury after falling out of his crib.  Semiology:Aura of colored lights in right visual field.It may last several minutes.Usually the warning of the aura gives him time to lay down and call the ambulance.Sometimes it resolves, while other times it progresses to a full-blown seizure, described as loss of consciousness, generalized jerking and tongue  biting.He averages approximately 1-2 seizures per year.But he says last seizure was around 1 to 1.5 years ago.  He presented to the ED at [facility] on 07/03/2017 because he began experiencing his habitual aura prior to his seizure, specifically flashing lights.  He called EMS and was transported to the hospital.  While on the stretcher, he did have a tonic-clonic seizure.  He was given 2 mg of Ativan.  He reportedly had been drinking alcohol.  He reported medication compliance.  However, valproic acid level was undetectable and Keppra level was low at 1.5.  Dilantin level was 7.3.  EtOH level was 14.  Basic metabolic panel showed a slight anion gap of 17.  CBC was unremarkable.  He was discharged once stable.  He takes Depakote ER 2000mg  twice daily, phenytoin 200mg  in AM and 50mg  at night, Keppra 750mg  twice daily.   Prior medication:phenobarbital.Higher doses of Dilantin caused ataxia.  09/20/09 MRI Brain w/o:mild generalized atrophy with prominence to the cerebellum.Post-operative changes of right globe.    Observations/Objective:   No acute distress.  Alert and oriented.  Speech fluent and not dysarthric.  Language intact.   Assessment and Plan:   1.  Localization-related epilepsy with secondary generalization.    1.  Depakote ER 2000mg  twice daily; Keppra 750mg  twice daily; Dilantin 200mg  in AM and 50mg  in PM. 2.  Check CBC, CMP, phenytoin level, valproic acid level and levetiracetam level 3.  Take calcium and vitamin D 4.  Follow up in one year  Follow Up Instructions:    -I discussed the assessment and treatment plan with  the patient. The patient was provided an opportunity to ask questions and all were answered. The patient agreed with the plan and demonstrated an understanding of the instructions.   The patient was advised to call back or seek an in-person evaluation if the symptoms worsen or if the condition fails to improve as anticipated.    Total  Time spent in visit with the patient was:  12 minutes   Dudley Major, DO

## 2019-04-17 ENCOUNTER — Other Ambulatory Visit: Payer: Self-pay

## 2019-04-17 ENCOUNTER — Telehealth (INDEPENDENT_AMBULATORY_CARE_PROVIDER_SITE_OTHER): Payer: Medicare HMO | Admitting: Neurology

## 2019-04-17 ENCOUNTER — Encounter: Payer: Self-pay | Admitting: Neurology

## 2019-04-17 VITALS — Ht 65.0 in | Wt 130.0 lb

## 2019-04-17 DIAGNOSIS — G40209 Localization-related (focal) (partial) symptomatic epilepsy and epileptic syndromes with complex partial seizures, not intractable, without status epilepticus: Secondary | ICD-10-CM | POA: Diagnosis not present

## 2019-04-18 ENCOUNTER — Other Ambulatory Visit: Payer: Self-pay

## 2019-04-18 DIAGNOSIS — G40209 Localization-related (focal) (partial) symptomatic epilepsy and epileptic syndromes with complex partial seizures, not intractable, without status epilepticus: Secondary | ICD-10-CM

## 2019-06-09 ENCOUNTER — Other Ambulatory Visit: Payer: Self-pay

## 2019-06-09 DIAGNOSIS — Z20822 Contact with and (suspected) exposure to covid-19: Secondary | ICD-10-CM

## 2019-06-10 LAB — NOVEL CORONAVIRUS, NAA: SARS-CoV-2, NAA: NOT DETECTED

## 2019-06-19 ENCOUNTER — Other Ambulatory Visit: Payer: Self-pay | Admitting: Neurology

## 2019-06-19 DIAGNOSIS — G40209 Localization-related (focal) (partial) symptomatic epilepsy and epileptic syndromes with complex partial seizures, not intractable, without status epilepticus: Secondary | ICD-10-CM

## 2019-07-17 ENCOUNTER — Other Ambulatory Visit: Payer: Self-pay | Admitting: Neurology

## 2019-07-24 ENCOUNTER — Ambulatory Visit: Payer: Medicare HMO | Attending: Internal Medicine

## 2019-07-24 ENCOUNTER — Ambulatory Visit: Payer: Medicare HMO

## 2019-07-24 DIAGNOSIS — Z23 Encounter for immunization: Secondary | ICD-10-CM

## 2019-07-24 NOTE — Progress Notes (Signed)
   Covid-19 Vaccination Clinic  Name:  [name]    MRN: [id number] DOB: 26-Mar-1956  07/24/2019  Mr. [name] was observed post Covid-19 immunization for 15 minutes without incident. He was provided with Vaccine Information Sheet and instruction to access the V-Safe system.   Mr. [name] was instructed to call 911 with any severe reactions post vaccine: Marland Kitchen Difficulty breathing  . Swelling of face and throat  . A fast heartbeat  . A bad rash all over body  . Dizziness and weakness   Immunizations Administered    Name Date Dose VIS Date Route   Pfizer COVID-19 Vaccine 07/24/2019 11:01 AM 0.3 mL 04/07/2019 Intramuscular   Manufacturer: [place]   Lot: [id number]   [place]: [id number]

## 2019-08-15 ENCOUNTER — Ambulatory Visit: Payer: Medicare HMO

## 2019-08-22 ENCOUNTER — Ambulatory Visit: Payer: Medicare HMO | Attending: Internal Medicine

## 2019-08-22 DIAGNOSIS — Z23 Encounter for immunization: Secondary | ICD-10-CM

## 2019-08-22 NOTE — Progress Notes (Signed)
   Covid-19 Vaccination Clinic  Name:  [name]    MRN: [id number] DOB: [birth year]  08/22/2019  [name] was observed post Covid-19 immunization for 12 minutes without incident. Pt unable to wait for entire 15 minutes. He was provided with Vaccine Information Sheet and instruction to access the V-Safe system.   [name] was instructed to call 911 with any severe reactions post vaccine: Marland Kitchen Difficulty breathing  . Swelling of face and throat  . A fast heartbeat  . A bad rash all over body  . Dizziness and weakness   Immunizations Administered    Name Date Dose VIS Date Route   Pfizer COVID-19 Vaccine 08/22/2019 12:31 PM 0.3 mL 06/21/2018 Intramuscular   Manufacturer: [place]   Lot: [id number]   [place]: [id number]

## 2019-09-20 ENCOUNTER — Emergency Department (HOSPITAL_COMMUNITY)
Admission: EM | Admit: 2019-09-20 | Discharge: 2019-09-20 | Disposition: A | Discharge status: Home / Self Care | Payer: Medicare HMO | Attending: Emergency Medicine | Admitting: Emergency Medicine

## 2019-09-20 ENCOUNTER — Other Ambulatory Visit: Payer: Self-pay

## 2019-09-20 ENCOUNTER — Encounter (HOSPITAL_COMMUNITY): Payer: Self-pay

## 2019-09-20 ENCOUNTER — Emergency Department (HOSPITAL_COMMUNITY): Payer: Medicare HMO

## 2019-09-20 DIAGNOSIS — F17210 Nicotine dependence, cigarettes, uncomplicated: Secondary | ICD-10-CM | POA: Diagnosis not present

## 2019-09-20 DIAGNOSIS — Z79899 Other long term (current) drug therapy: Secondary | ICD-10-CM | POA: Insufficient documentation

## 2019-09-20 DIAGNOSIS — I10 Essential (primary) hypertension: Secondary | ICD-10-CM | POA: Insufficient documentation

## 2019-09-20 DIAGNOSIS — R109 Unspecified abdominal pain: Secondary | ICD-10-CM | POA: Diagnosis present

## 2019-09-20 LAB — CBC
HCT: 46.1 % (ref 39.0–52.0)
Hemoglobin: 15.1 g/dL (ref 13.0–17.0)
MCH: 30.2 pg (ref 26.0–34.0)
MCHC: 32.8 g/dL (ref 30.0–36.0)
MCV: 92.2 fL (ref 80.0–100.0)
Platelets: 239 10*3/uL (ref 150–400)
RBC: 5 MIL/uL (ref 4.22–5.81)
RDW: 14.7 % (ref 11.5–15.5)
WBC: 9 10*3/uL (ref 4.0–10.5)
nRBC: 0 % (ref 0.0–0.2)

## 2019-09-20 LAB — COMPREHENSIVE METABOLIC PANEL
ALT: 29 U/L (ref 0–44)
AST: 29 U/L (ref 15–41)
Albumin: 4.1 g/dL (ref 3.5–5.0)
Alkaline Phosphatase: 58 U/L (ref 38–126)
Anion gap: 14 (ref 5–15)
BUN: 11 mg/dL (ref 8–23)
CO2: 21 mmol/L — ABNORMAL LOW (ref 22–32)
Calcium: 9.1 mg/dL (ref 8.9–10.3)
Chloride: 105 mmol/L (ref 98–111)
Creatinine, Ser: 0.67 mg/dL (ref 0.61–1.24)
GFR calc Af Amer: >60 mL/min (ref >60)
GFR calc non Af Amer: >60 mL/min (ref >60)
Glucose, Bld: 78 mg/dL (ref 70–99)
Potassium: 3.7 mmol/L (ref 3.5–5.1)
Sodium: 140 mmol/L (ref 135–145)
Total Bilirubin: 0.2 mg/dL — ABNORMAL LOW (ref 0.3–1.2)
Total Protein: 7.4 g/dL (ref 6.5–8.1)

## 2019-09-20 LAB — URINALYSIS, ROUTINE W REFLEX MICROSCOPIC
Bilirubin Urine: NEGATIVE
Glucose, UA: NEGATIVE mg/dL
Hgb urine dipstick: NEGATIVE
Ketones, ur: 5 mg/dL — AB
Leukocytes,Ua: NEGATIVE
Nitrite: NEGATIVE
Protein, ur: NEGATIVE mg/dL
Specific Gravity, Urine: 1.014 (ref 1.005–1.030)
pH: 5 (ref 5.0–8.0)

## 2019-09-20 LAB — LIPASE, BLOOD: Lipase: 22 U/L (ref 11–51)

## 2019-09-20 LAB — CBG MONITORING, ED: Glucose-Capillary: 89 mg/dL (ref 70–99)

## 2019-09-20 MED ORDER — CEPHALEXIN 500 MG PO CAPS: 500.0000 mg | ORAL_CAPSULE | Freq: Four times a day (QID) | ORAL | 0 refills | Status: DC

## 2019-09-20 MED ORDER — HYDROCODONE-ACETAMINOPHEN 5-325 MG PO TABS: 1.0000 | ORAL_TABLET | ORAL | 0 refills | Status: DC | PRN

## 2019-09-20 NOTE — ED Triage Notes (Signed)
Pt from home with ems for right sided flank pain. +ETOH last night. No injury or fall.

## 2019-09-20 NOTE — ED Provider Notes (Signed)
[place] EMERGENCY DEPARTMENT Provider Note   CSN: [id number] Arrival date & time: 09/20/19  0809     History Chief Complaint  Patient presents with  . Flank Pain    [name] is a 64 y.o. male.  HPI He presents for evaluation of right flank pain, present for for 5 days without trauma.  He denies dysuria, urinary frequency, nausea or vomiting.  He has tried over-the-counter medicine without relief.  He denies fever, chills, vomiting or dizziness.  There are no other known modifying factors.    Past Medical History:  Diagnosis Date  . Blindness of right eye   . Diabetes mellitus   . Epilepsy ([place])    As reported by patient  . Seizures ([place])    since age 50;last seizure 3-4 years ago per pt  . Stroke [facility]) 2 years ago    Patient Active Problem List   Diagnosis Date Noted  . Leukocytosis 01/24/2017  . Overdose 01/24/2017  . Hypertension 01/24/2017  . Tachycardia 01/24/2017  . Back pain 01/24/2017  . Sciatica of right side 04/30/2016  . Fever 04/30/2016  . Localization-related symptomatic epilepsy and epileptic syndromes with complex partial seizures, not intractable, without status epilepticus ([place]) 01/29/2015  . Blindness of right eye   . LUNG ABSCESS 03/14/2008    Past Surgical History:  Procedure Laterality Date  . NO PAST SURGERIES         Family History  Problem Relation Age of Onset  . Diabetes Mother   . Hypertension Mother   . Colon cancer Neg Hx     Social History   Tobacco Use  . Smoking status: Current Every Day Smoker    Packs/day: 0.25    Types: Cigarettes  . Smokeless tobacco: Never Used  Substance Use Topics  . Alcohol use: Yes    Comment: one 40 oz beer each day  . Drug use: Yes    Types: Cocaine    Comment: cocaine--weekly per pt; last time couple days ago    Home Medications Prior to Admission medications   Medication Sig Start Date End Date Taking? Authorizing Provider  acetaminophen (TYLENOL) 325 MG  tablet Take 2 tablets (650 mg total) by mouth every 6 (six) hours as needed for mild pain. 05/02/16  Yes Arrien, Jimmy Picket, MD  divalproex (DEPAKOTE ER) 500 MG 24 hr tablet TAKE 4 TABLETS BY MOUTH 2 (TWO) TIMES DAILY. Patient taking differently: Take 2,000 mg by mouth in the morning and at bedtime. TAKE 4 TABLETS BY MOUTH 2 (TWO) TIMES DAILY. 03/16/19  Yes Jaffe, Adam R, DO  levETIRAcetam (KEPPRA) 750 MG tablet TAKE 1 TABLET (750 MG TOTAL) BY MOUTH 2 (TWO) TIMES DAILY. 07/17/19  Yes Jaffe, Adam R, DO  phenytoin (DILANTIN) 100 MG ER capsule TAKE 2 CAPSULES (200 MG TOTAL) BY MOUTH EVERY MORNING. Patient taking differently: Take 200 mg by mouth daily. morning 06/19/19  Yes Jaffe, Adam R, DO  phenytoin (DILANTIN) 50 MG tablet Chew 1 tablet (50 mg total) by mouth at bedtime. 03/16/19  Yes Jaffe, Adam R, DO  cephALEXin (KEFLEX) 500 MG capsule Take 1 capsule (500 mg total) by mouth 4 (four) times daily. 09/20/19   Daleen Bo, MD  HYDROcodone-acetaminophen (NORCO/VICODIN) 5-325 MG tablet Take 1 tablet by mouth every 4 (four) hours as needed for moderate pain. 09/20/19   Daleen Bo, MD    Allergies    Patient has no known allergies.  Review of Systems   Review of Systems  All other systems reviewed and are negative.   Physical Exam Updated Vital Signs BP (!) 153/83   Pulse 77   Temp 98.4 F (36.9 C) (Oral)   Resp 19   Ht 5\' 5"  (1.651 m)   Wt 59 kg   SpO2 97%   BMI 21.63 kg/m   Physical Exam Vitals and nursing note reviewed.  Constitutional:      General: He is not in acute distress.    Appearance: He is well-developed. He is not ill-appearing, toxic-appearing or diaphoretic.  HENT:     Head: Normocephalic and atraumatic.     Right Ear: External ear normal.     Left Ear: External ear normal.  Eyes:     Comments: Mild dry drainage, right eye, patient tends to the lids closed.  Unable to open the lids in the globe is intact however the anterior globe is cloudy.  Neck:      Trachea: Phonation normal.  Cardiovascular:     Rate and Rhythm: Normal rate and regular rhythm.     Heart sounds: Normal heart sounds.  Pulmonary:     Effort: Pulmonary effort is normal.     Breath sounds: Normal breath sounds.  Abdominal:     Palpations: Abdomen is soft.     Tenderness: There is no abdominal tenderness.  Musculoskeletal:        General: Normal range of motion.     Cervical back: Normal range of motion and neck supple.  Skin:    General: Skin is warm and dry.  Neurological:     Mental Status: He is alert and oriented to person, place, and time.     Cranial Nerves: No cranial nerve deficit.     Sensory: No sensory deficit.     Motor: No abnormal muscle tone.     Coordination: Coordination normal.  Psychiatric:        Mood and Affect: Mood normal.        Behavior: Behavior normal.        Thought Content: Thought content normal.        Judgment: Judgment normal.     ED Results / Procedures / Treatments   Labs (all labs ordered are listed, but only abnormal results are displayed) Labs Reviewed  COMPREHENSIVE METABOLIC PANEL - Abnormal; Notable for the following components:      Result Value   CO2 21 (*)    Total Bilirubin 0.2 (*)    All other components within normal limits  URINALYSIS, ROUTINE W REFLEX MICROSCOPIC - Abnormal; Notable for the following components:   Ketones, ur 5 (*)    All other components within normal limits  URINE CULTURE  LIPASE, BLOOD  CBC  CBG MONITORING, ED    EKG None  Radiology CT Renal Stone Study  Result Date: 09/20/2019 CLINICAL DATA:  Right flank pain EXAM: CT ABDOMEN AND PELVIS WITHOUT CONTRAST TECHNIQUE: Multidetector CT imaging of the abdomen and pelvis was performed following the standard protocol without IV contrast. COMPARISON:  None. FINDINGS: Lower chest: No acute abnormality. Hepatobiliary: Liver is partially imaged. There is a too small to characterize hypoattenuating lesion of the inferior right lobe.  Cholelithiasis. Pancreas: Unremarkable. Spleen: Unremarkable Adrenals/Urinary Tract: Adrenals are unremarkable. There is bilateral perinephric stranding. No renal calculi. Bladder is unremarkable. Stomach/Bowel: Stomach is within normal limits. Bowel is normal in caliber. Normal appendix. Vascular/Lymphatic: Aortic atherosclerosis. No enlarged abdominal or pelvic lymph nodes. Reproductive: Unremarkable. Other: No ascites.  Abdominal wall is unremarkable. Musculoskeletal: Lumbar degenerative  changes are present particularly at L4-L5. No acute osseous abnormality. IMPRESSION: No urinary tract calculi or hydronephrosis. Nonspecific perinephric stranding, which could be chronic; a process such as pyelonephritis is not well evaluated on this noncontrast study. Cholelithiasis. Electronically Signed   By: Macy Mis M.D.   On: 09/20/2019 14:55    Procedures Procedures (including critical care time)  Medications Ordered in ED Medications - No data to display  ED Course  I have reviewed the triage vital signs and the nursing notes.  Pertinent labs & imaging results that were available during my care of the patient were reviewed by me and considered in my medical decision making (see chart for details).  Clinical Course as of Sep 22 922  Wed Sep 20, 2019  1604 Normal  Lipase, blood [EW]  1604 Normal  CBC [EW]  1605 Normal  Comprehensive metabolic panel(!) [EW]  Fri Sep 22, 2019  0920 Normal  Urinalysis, Routine w reflex microscopic(!) [EW]  0920 CT  Hemoglobin: 15.1 [EW]    Clinical Course User Index [EW] Daleen Bo, MD   MDM Rules/Calculators/A&P                       Patient Vitals for the past 24 hrs:  BP Temp Temp src Pulse Resp SpO2 Height Weight  09/20/19 1400 (!) 153/83 -- -- 77 19 97 % -- --  09/20/19 1345 (!) 167/90 -- -- 77 14 97 % -- --  09/20/19 1156 (!) 152/102 -- -- 70 16 97 % -- --  09/20/19 0818 -- -- -- -- -- -- 5\' 5"  (1.651 m) 59 kg  09/20/19 0817 (!) 141/104  98.4 F (36.9 C) Oral 80 18 99 % -- --      Medical Decision Making:  This patient is presenting for evaluation of right flank pain, without trauma, which does require a range of treatment options, and is a complaint that involves a moderate risk of morbidity and mortality. The differential diagnoses include muscle strain, kidney stone, radicular pain. I decided to review old records, and in summary middle-aged male, with history of right-sided sciatica, hypertension and back pain..  I did not require additional historical information from anyone.  Clinical Laboratory Tests Ordered, included CBC, Metabolic panel and Urinalysis. Review indicates these tests are normal. Radiologic Tests Ordered, included CT abdomen pelvis.  I independently Visualized: CT images, which show perinephric stranding, bilaterally, cholelithiasis without cholecystitis  Cardiac Monitor Tracing which shows normal sinus rhythm     Critical Interventions-clinical evaluation, laboratory testing, CT imaging, observation and reassessment  After These Interventions, the Patient was reevaluated and was found stable for discharge. He has slight pain with somewhat abnormal urinary analysis. Urine culture ordered. Will cover with antibiotics pending return of urine culture. Patient has an early morning appointment with his cardiologist for follow-up care. He has frequent visits to the emergency department for chest pain. Patient is encouraged to go to his appointment this morning after ED  discharge  CRITICAL CARE-no Performed by: Daleen Bo  Nursing Notes Reviewed/ Care Coordinated Applicable Imaging Reviewed Interpretation of Laboratory Data incorporated into ED treatment  The patient appears reasonably screened and/or stabilized for discharge and I doubt any other medical condition or other [facility] requiring further screening, evaluation, or treatment in the ED at this time prior to discharge.  Plan: Home  Medications-continue current medications; Home Treatments-regular diet and activities; return here if the recommended treatment, does not improve the symptoms; Recommended follow up-PCP, as needed  Final Clinical Impression(s) / ED Diagnoses Final diagnoses:  Flank pain    Rx / DC Orders ED Discharge Orders         Ordered    cephALEXin (KEFLEX) 500 MG capsule  4 times daily     09/20/19 1619    HYDROcodone-acetaminophen (NORCO/VICODIN) 5-325 MG tablet  Every 4 hours PRN     09/20/19 1619           Daleen Bo, MD 09/22/19 [phone]

## 2019-09-20 NOTE — ED Notes (Signed)
Pt. Appeared agitated when asked again for urine. Told Tech that he would "call out when he was able to go."

## 2019-09-20 NOTE — ED Notes (Signed)
Pt. Aware urine is needed, states he is unable to provide any at this time. Urinal at bedside and pt. Instructed to use the call light when he has urine.

## 2019-09-20 NOTE — Discharge Instructions (Signed)
Make sure you are eating well and drinking a lot of fluids.  We are treating you for possible urinary tract infection.  Follow-up with your doctor next week for a checkup.

## 2019-09-20 NOTE — ED Notes (Signed)
Patient verbalizes understanding of discharge instructions. Opportunity for questioning and answers were provided. Armband removed by staff, pt discharged from ED ambulatory by self. Pt cursing at this RN during review of DC paperwork, stated that he should have never come here. Encouraged pt to contact pt experience if need be. Asked if we could do anything else, pt refused and left.

## 2019-09-21 LAB — URINE CULTURE: Culture: NO GROWTH

## 2019-11-04 ENCOUNTER — Other Ambulatory Visit: Payer: Self-pay | Admitting: Neurology

## 2019-12-29 ENCOUNTER — Other Ambulatory Visit: Payer: Self-pay | Admitting: Neurology

## 2019-12-29 DIAGNOSIS — G40209 Localization-related (focal) (partial) symptomatic epilepsy and epileptic syndromes with complex partial seizures, not intractable, without status epilepticus: Secondary | ICD-10-CM

## 2020-02-22 ENCOUNTER — Other Ambulatory Visit: Payer: Self-pay | Admitting: Neurology

## 2020-02-22 DIAGNOSIS — G40209 Localization-related (focal) (partial) symptomatic epilepsy and epileptic syndromes with complex partial seizures, not intractable, without status epilepticus: Secondary | ICD-10-CM

## 2020-02-29 ENCOUNTER — Emergency Department (HOSPITAL_COMMUNITY)
Admission: EM | Admit: 2020-02-29 | Discharge: 2020-02-29 | Disposition: A | Discharge status: Home / Self Care | Payer: Medicare HMO | Attending: Emergency Medicine | Admitting: Emergency Medicine

## 2020-02-29 ENCOUNTER — Other Ambulatory Visit: Payer: Self-pay

## 2020-02-29 ENCOUNTER — Emergency Department (HOSPITAL_COMMUNITY): Payer: Medicare HMO

## 2020-02-29 DIAGNOSIS — M546 Pain in thoracic spine: Secondary | ICD-10-CM

## 2020-02-29 DIAGNOSIS — I10 Essential (primary) hypertension: Secondary | ICD-10-CM | POA: Insufficient documentation

## 2020-02-29 DIAGNOSIS — F17210 Nicotine dependence, cigarettes, uncomplicated: Secondary | ICD-10-CM | POA: Diagnosis not present

## 2020-02-29 DIAGNOSIS — E119 Type 2 diabetes mellitus without complications: Secondary | ICD-10-CM | POA: Insufficient documentation

## 2020-02-29 MED ORDER — CYCLOBENZAPRINE HCL 10 MG PO TABS: 10.0000 mg | ORAL_TABLET | Freq: Two times a day (BID) | ORAL | 0 refills | Status: AC | PRN

## 2020-02-29 MED ORDER — CYCLOBENZAPRINE HCL 10 MG PO TABS
5.0000 mg | ORAL_TABLET | Freq: Once | ORAL | Status: AC
  Administered 2020-02-29: 5 mg via ORAL
  Filled 2020-02-29: qty 1

## 2020-02-29 MED ORDER — LIDOCAINE 5 % EX PTCH
1.0000 | MEDICATED_PATCH | CUTANEOUS | Status: DC
  Administered 2020-02-29: 1 via TRANSDERMAL
  Filled 2020-02-29: qty 1

## 2020-02-29 MED ORDER — KETOROLAC TROMETHAMINE 15 MG/ML IJ SOLN
15.0000 mg | Freq: Once | INTRAMUSCULAR | Status: AC
  Administered 2020-02-29: 15 mg via INTRAMUSCULAR
  Filled 2020-02-29: qty 1

## 2020-02-29 NOTE — ED Provider Notes (Signed)
[place] EMERGENCY DEPARTMENT Provider Note   CSN: [id number] Arrival date & time: 02/29/20  1008     History Chief Complaint  Patient presents with  . Back Pain    [name] is a 64 y.o. male.  The history is provided by the patient.  Shoulder Injury This is a new problem. The current episode started more than 2 days ago. The problem occurs constantly. The problem has been gradually worsening. Pertinent negatives include no chest pain, no abdominal pain and no shortness of breath. The symptoms are aggravated by bending, twisting and standing. The symptoms are relieved by rest. He has tried nothing for the symptoms.  Back Pain Location:  Thoracic spine Radiates to:  L shoulder Onset quality:  Sudden Duration:  2 days (awoke Tuesday morning with pain in upper back and L shoudler) Timing:  Constant Progression:  Worsening Chronicity:  New Context: not falling   Relieved by:  Being still Associated symptoms: no abdominal pain, no chest pain, no fever, no leg pain, no numbness, no paresthesias, no tingling and no weakness        Past Medical History:  Diagnosis Date  . Blindness of right eye   . Diabetes mellitus   . Epilepsy ([place])    As reported by patient  . Seizures ([place])    since age 43;last seizure 3-4 years ago per pt  . Stroke [facility]) 2 years ago    Patient Active Problem List   Diagnosis Date Noted  . Leukocytosis 01/24/2017  . Overdose 01/24/2017  . Hypertension 01/24/2017  . Tachycardia 01/24/2017  . Back pain 01/24/2017  . Sciatica of right side 04/30/2016  . Fever 04/30/2016  . Localization-related symptomatic epilepsy and epileptic syndromes with complex partial seizures, not intractable, without status epilepticus ([place]) 01/29/2015  . Blindness of right eye   . LUNG ABSCESS 03/14/2008    Past Surgical History:  Procedure Laterality Date  . NO PAST SURGERIES         Family History  Problem Relation Age of Onset  .  Diabetes Mother   . Hypertension Mother   . Colon cancer Neg Hx     Social History   Tobacco Use  . Smoking status: Current Every Day Smoker    Packs/day: 0.25    Types: Cigarettes  . Smokeless tobacco: Never Used  Vaping Use  . Vaping Use: Never used  Substance Use Topics  . Alcohol use: Yes    Comment: one 40 oz beer each day  . Drug use: Yes    Types: Cocaine    Comment: cocaine--weekly per pt; last time couple days ago    Home Medications Prior to Admission medications   Medication Sig Start Date End Date Taking? Authorizing Provider  acetaminophen (TYLENOL) 325 MG tablet Take 2 tablets (650 mg total) by mouth every 6 (six) hours as needed for mild pain. 05/02/16   Arrien, Jimmy Picket, MD  cephALEXin (KEFLEX) 500 MG capsule Take 1 capsule (500 mg total) by mouth 4 (four) times daily. 09/20/19   Daleen Bo, MD  cyclobenzaprine (FLEXERIL) 10 MG tablet Take 1 tablet (10 mg total) by mouth 2 (two) times daily as needed for up to 2 days for muscle spasms. 02/29/20 03/02/20  Roosevelt Locks, MD  divalproex (DEPAKOTE ER) 500 MG 24 hr tablet TAKE 4 TABLETS BY MOUTH 2 (TWO) TIMES DAILY. 02/22/20   Pieter Partridge, DO  HYDROcodone-acetaminophen (NORCO/VICODIN) 5-325 MG tablet Take 1 tablet by mouth every 4 (  four) hours as needed for moderate pain. 09/20/19   Daleen Bo, MD  levETIRAcetam (KEPPRA) 750 MG tablet TAKE 1 TABLET (750 MG TOTAL) BY MOUTH 2 (TWO) TIMES DAILY. 02/22/20   Tomi Likens, Adam R, DO  phenytoin (DILANTIN) 100 MG ER capsule TAKE 2 CAPSULES (200 MG TOTAL) BY MOUTH EVERY MORNING. 12/29/19   Jaffe, Adam R, DO  PHENYTOIN INFATABS 50 MG tablet CHEW 1 TABLET (50 MG TOTAL) BY MOUTH AT BEDTIME. 02/22/20   Pieter Partridge, DO    Allergies    Patient has no known allergies.  Review of Systems   Review of Systems  Constitutional: Negative for chills and fever.  HENT: Negative for congestion and sore throat.   Respiratory: Negative for cough and shortness of breath.     Cardiovascular: Negative for chest pain.  Gastrointestinal: Negative for abdominal pain, nausea and vomiting.  Genitourinary: Negative for difficulty urinating.  Musculoskeletal: Positive for back pain. Negative for neck pain.  Skin: Negative for wound.  Neurological: Negative for tingling, weakness, numbness and paresthesias.  All other systems reviewed and are negative.   Physical Exam Updated Vital Signs BP (!) 125/92   Pulse 78   Temp 98.7 F (37.1 C) (Oral)   Resp 16   Ht 5\' 5"  (1.651 m)   Wt 65.8 kg   SpO2 100%   BMI 24.13 kg/m   Physical Exam Vitals reviewed.  Constitutional:      Appearance: Normal appearance.  HENT:     Head: Normocephalic and atraumatic.     Nose: Nose normal.     Mouth/Throat:     Mouth: Mucous membranes are moist.     Pharynx: Oropharynx is clear.  Eyes:     General: No scleral icterus.    Comments: R eye lid partially close, pt blind in this eye  Cardiovascular:     Heart sounds: Normal heart sounds.  Pulmonary:     Effort: Pulmonary effort is normal.     Breath sounds: Normal breath sounds.  Abdominal:     General: Abdomen is flat.     Palpations: Abdomen is soft.     Tenderness: There is no abdominal tenderness.  Musculoskeletal:        General: No deformity.     Cervical back: Neck supple. No tenderness.     Right lower leg: No edema.     Left lower leg: No edema.     Comments: Tenderness in L mid back, non midline, extending across L scapula   Skin:    General: Skin is warm and dry.  Neurological:     Mental Status: He is alert.     Motor: No weakness.  Psychiatric:        Mood and Affect: Mood normal.        Behavior: Behavior normal.     ED Results / Procedures / Treatments   Labs (all labs ordered are listed, but only abnormal results are displayed) Labs Reviewed - No data to display  EKG None  Radiology DG Thoracic Spine 2 View  Result Date: 02/29/2020 CLINICAL DATA:  Thoracic back pain. EXAM: THORACIC  SPINE 2 VIEWS COMPARISON:  January 21, 2017. FINDINGS: Stable mild levoscoliosis of thoracic spine is noted. No fracture or spondylolisthesis is noted. Mild degenerative disc disease is seen involving lower thoracic disc space. Remaining disc spaces are unremarkable. IMPRESSION: Mild degenerative disc disease is seen involving lower thoracic disc space. No acute abnormality is noted. Electronically Signed   By: Sabino Dick  Jr M.D.   On: 02/29/2020 11:30   DG Shoulder Left  Result Date: 02/29/2020 CLINICAL DATA:  Left shoulder pain. EXAM: LEFT SHOULDER - 2+ VIEW COMPARISON:  None. FINDINGS: There is no evidence of fracture or dislocation. There is no evidence of arthropathy or other focal bone abnormality. Soft tissues are unremarkable. IMPRESSION: Negative. Electronically Signed   By: Marijo Conception M.D.   On: 02/29/2020 11:28    Procedures Procedures (including critical care time)  Medications Ordered in ED Medications  lidocaine (LIDODERM) 5 % 1 patch (1 patch Transdermal Patch Applied 02/29/20 1129)  cyclobenzaprine (FLEXERIL) tablet 5 mg (5 mg Oral Given 02/29/20 1129)  ketorolac (TORADOL) 15 MG/ML injection 15 mg (15 mg Intramuscular Given 02/29/20 1129)    ED Course  I have reviewed the triage vital signs and the nursing notes.  Pertinent labs & imaging results that were available during my care of the patient were reviewed by me and considered in my medical decision making (see chart for details).    MDM Rules/Calculators/A&P                           Medical Decision Making: AHMET SCHANK is a 64 y.o. male who presented to the ED today with upper back pain and shoulder pain.  Past medical history significant for DM, R eye blindness, epilepsy Reviewed and confirmed nursing documentation for past medical history, family history, social history.  On my initial exam, the pt was in NAD, resting comfortably in chair.   Pt with thoracic back pain. Reports he twisted weird on  Monday and felt a twinge, real pain started Tuesday morning upon awakening. Pt denies hx of cancer, pt is not on anticoagulation, no recent trauma or falls, pt denies red flag back pain sx (specifically denying bowel or bladder dysfunction, numbness, weakness, perianal numbness)    Xray shoulder and thoracic spine obtained, no acute traumatic injury. Mild degenerative disc disease.  Suspect muscular strain as cause of pt back and shoulder pain.  Pt given pain control with flexeril, lidocaine patch, and ketorolac.  Consults: none performed All radiology and laboratory studies reviewed independently and with my attending physician, agree with reading provided by radiologist unless otherwise noted.  Upon reassessing patient, patient reports pain is improved, pt able to move shoulder more easily and ambulate without difficulty.   Pt dc with short course flexeril, advised to take ibuprofen and tylenol as needed for pain.   Based on the above findings, I believe patient is hemodynamically stable for discharge  Patient/and family educated about specific return precautions for given chief complaint and symptoms.  Patient/and family educated about follow-up with PCP.  Patient/and family expressed understanding of return precautions and need for follow-up.  Patient discharged.  The above care was discussed with and agreed upon by my attending physician.   Emergency Department Medication Summary:  Medications  lidocaine (LIDODERM) 5 % 1 patch (1 patch Transdermal Patch Applied 02/29/20 1129)  cyclobenzaprine (FLEXERIL) tablet 5 mg (5 mg Oral Given 02/29/20 1129)  ketorolac (TORADOL) 15 MG/ML injection 15 mg (15 mg Intramuscular Given 02/29/20 1129)       Final Clinical Impression(s) / ED Diagnoses Final diagnoses:  Left-sided thoracic back pain, unspecified chronicity    Rx / DC Orders ED Discharge Orders         Ordered    cyclobenzaprine (FLEXERIL) 10 MG tablet  2 times daily PRN  02/29/20 [place], Avilene Marrin, MD 02/29/20 [place], [place], DO 02/29/20 1152

## 2020-02-29 NOTE — Discharge Instructions (Signed)
° °?  ric R Dancy:  Thank you for allowing [place] to take care of you today.  We hope you begin feeling better soon.  To-Do: Please follow-up with your primary doctor or call above to schedule an appointment with a new primary care doctor Take flexeril 10 mg two times daily as needed for pain. You may also take tylenol and ibuprofen as needed for pain.  Please return to the Emergency Department or call 911 if you experience chest pain, shortness of breath, severe pain, severe fever, altered mental status, or have any reason to think that you need emergency medical care.  Thank you again.  Hope you feel better soon.

## 2020-02-29 NOTE — ED Triage Notes (Signed)
Pt arrives via EMS for eval of mid back pain after turning the wrong way in a chair a few days ago. Pain was worse the next day and has continued. Pt ambulatory.

## 2020-03-14 ENCOUNTER — Ambulatory Visit: Payer: Medicare HMO

## 2020-04-18 ENCOUNTER — Ambulatory Visit: Payer: Medicare HMO | Admitting: Neurology

## 2020-04-29 NOTE — Progress Notes (Signed)
NEUROLOGY FOLLOW UP OFFICE NOTE  [facility]ONTARI[facility] JS:[id number]   Subjective:  [name] is a 65 year old right-handed male with diabetes mellitus and right eye blindness who follows up for localization-related epilepsy.Marland Kitchen  UPDATE: Medications:Depakote ER 2000 mg twice daily, Keppra 750 mg twice daily, Dilantin 200 mg in a.m. and 50 mg in p.m  Last seizure:  March 2019 (in setting of medication noncompliance).  He reports one 60 second "aura" that occurred last week.  He says he has been compliant with his medications.  He is doing well.  09/20/2019 LABS:  CBC with WBC 9, HGB 15.1, HCT 46.1, PLT 239; CMP with Na 140, K 3.7, Cl 105, CO2 21, glucose 78, BUN 11, Cr 0.67, t bili 0.2, ALP 58, AST 29, ALT 29.  HISTORY: He began having seizures at age 85.It may have been precipitated by a head injury after falling out of his crib.  Semiology:Aura of colored lights in right visual field.It may last several minutes.Usually the warning of the aura gives him time to lay down and call the ambulance.Sometimes it resolves, while other times it progresses to a full-blown seizure, described as loss of consciousness, generalized jerking and tongue biting.He averages approximately 1-2 seizures per year.But he says last seizure was around 1 to 1.5 years ago.  He presented to the ED at [facility] on 07/03/2017 because he began experiencing his habitual aura prior to his seizure, specifically flashing lights. He called EMS and was transported to the hospital. While on the stretcher, he did have a tonic-clonic seizure. He was given 2 mg of Ativan. He reportedly had been drinking alcohol. He reported medication compliance. However, valproic acid level was undetectable and Keppra level was low at 1.5. Dilantin level was 7.3. EtOH level was 14. Basic metabolic panel showed a slight anion gap of 17. CBC was unremarkable. He was discharged once stable.  He takes Depakote ER  2000mg  twice daily, phenytoin 200mg  in AM and 50mg  at night, Keppra 750mg  twice daily.   Prior medication:phenobarbital.Higher doses of Dilantin caused ataxia.  09/20/09 MRI Brain w/o:mild generalized atrophy with prominence to the cerebellum.Post-operative changes of right globe.  PAST MEDICAL HISTORY: Past Medical History:  Diagnosis Date  . Blindness of right eye   . Diabetes mellitus   . Epilepsy ([place])    As reported by patient  . Seizures ([place])    since age 87;last seizure 3-4 years ago per pt  . Stroke [facility]) 2 years ago    MEDICATIONS: Current Outpatient Medications on File Prior to Visit  Medication Sig Dispense Refill  . acetaminophen (TYLENOL) 325 MG tablet Take 2 tablets (650 mg total) by mouth every 6 (six) hours as needed for mild pain. 30 tablet 0  . cephALEXin (KEFLEX) 500 MG capsule Take 1 capsule (500 mg total) by mouth 4 (four) times daily. 28 capsule 0  . divalproex (DEPAKOTE ER) 500 MG 24 hr tablet TAKE 4 TABLETS BY MOUTH 2 (TWO) TIMES DAILY. 240 tablet 6  . HYDROcodone-acetaminophen (NORCO/VICODIN) 5-325 MG tablet Take 1 tablet by mouth every 4 (four) hours as needed for moderate pain. 20 tablet 0  . levETIRAcetam (KEPPRA) 750 MG tablet TAKE 1 TABLET (750 MG TOTAL) BY MOUTH 2 (TWO) TIMES DAILY. 60 tablet 3  . phenytoin (DILANTIN) 100 MG ER capsule TAKE 2 CAPSULES (200 MG TOTAL) BY MOUTH EVERY MORNING. 60 capsule 3  . PHENYTOIN INFATABS 50 MG tablet CHEW 1 TABLET (50 MG TOTAL) BY MOUTH AT BEDTIME. 30 tablet  6   Current Facility-Administered Medications on File Prior to Visit  Medication Dose Route Frequency Provider Last Rate Last Admin  . 0.9 %  sodium chloride infusion  500 mL Intravenous Once Danis, Andreas Blower, MD        ALLERGIES: No Known Allergies  FAMILY HISTORY: Family History  Problem Relation Age of Onset  . Diabetes Mother   . Hypertension Mother   . Colon cancer Neg Hx     SOCIAL HISTORY: Social History   Socioeconomic History   . Marital status: Single    Spouse name: Not on file  . Number of children: Not on file  . Years of education: Not on file  . Highest education level: Not on file  Occupational History  . Not on file  Tobacco Use  . Smoking status: Current Every Day Smoker    Packs/day: 0.25    Types: Cigarettes  . Smokeless tobacco: Never Used  Vaping Use  . Vaping Use: Never used  Substance and Sexual Activity  . Alcohol use: Yes    Comment: one 40 oz beer each day  . Drug use: Yes    Types: Cocaine    Comment: cocaine--weekly per pt; last time couple days ago  . Sexual activity: Never  Other Topics Concern  . Not on file  Social History Narrative  . Not on file   Social Determinants of Health   Financial Resource Strain: Not on file  Food Insecurity: Not on file  Transportation Needs: Not on file  Physical Activity: Not on file  Stress: Not on file  Social Connections: Not on file  Intimate Partner Violence: Not on file     Objective:  Blood pressure 132/85, pulse 78, resp. rate 18, height 5\' 5"  (1.651 m), weight 149 lb (67.6 kg), SpO2 95 %. General: No acute distress.  Patient appears well-groomed.   Head:  Normocephalic/atraumatic Eyes:  Fundi examined but not visualized Neck: supple, no paraspinal tenderness, full range of motion Heart:  Regular rate and rhythm Lungs:  Clear to auscultation bilaterally Back: No paraspinal tenderness Neurological Exam: alert and oriented to person, place, and time. Attention span and concentration intact, recent and remote memory intact, fund of knowledge intact.  Speech fluent and not dysarthric, language intact.  Right eye with ptosis, ophthalmoplegia and vision loss.  Otherwise, CN II-XII intact. Bulk and tone normal, muscle strength 5/5 throughout.  Sensation to light touch, temperature and vibration intact.  Deep tendon reflexes 2+ throughout, toes downgoing.  Finger to nose and heel to shin testing intact.  Gait normal, Romberg  negative.   Assessment/Plan:   Localization-related epilepsy with secondary generalization  1.  Depakote ER 2000mg  twice daily; Keppra 750mg  twice daily; Dilantin 200mg  in AM and 50mg  in PM. 2.  Calcium and vitamin D 3.  Check CBC and CMP 4.  Follow up one year  , DO  CC: , NP

## 2020-05-02 ENCOUNTER — Other Ambulatory Visit: Payer: Self-pay

## 2020-05-02 ENCOUNTER — Encounter: Payer: Self-pay | Admitting: Neurology

## 2020-05-02 ENCOUNTER — Ambulatory Visit (INDEPENDENT_AMBULATORY_CARE_PROVIDER_SITE_OTHER): Payer: Medicare HMO | Admitting: Neurology

## 2020-05-02 ENCOUNTER — Other Ambulatory Visit (INDEPENDENT_AMBULATORY_CARE_PROVIDER_SITE_OTHER): Payer: Medicare HMO

## 2020-05-02 VITALS — BP 132/85 | HR 78 | Resp 18 | Ht 65.0 in | Wt 149.0 lb

## 2020-05-02 DIAGNOSIS — G40209 Localization-related (focal) (partial) symptomatic epilepsy and epileptic syndromes with complex partial seizures, not intractable, without status epilepticus: Secondary | ICD-10-CM

## 2020-05-02 DIAGNOSIS — Z79899 Other long term (current) drug therapy: Secondary | ICD-10-CM

## 2020-05-02 LAB — COMPREHENSIVE METABOLIC PANEL
ALT: 34 U/L (ref 0–53)
AST: 25 U/L (ref 0–37)
Albumin: 4.5 g/dL (ref 3.5–5.2)
Alkaline Phosphatase: 58 U/L (ref 39–117)
BUN: 28 mg/dL — ABNORMAL HIGH (ref 6–23)
CO2: 30 mEq/L (ref 19–32)
Calcium: 9.5 mg/dL (ref 8.4–10.5)
Chloride: 108 mEq/L (ref 96–112)
Creatinine, Ser: 1.07 mg/dL (ref 0.40–1.50)
GFR: 73.28 mL/min (ref >60.00)
Glucose, Bld: 75 mg/dL (ref 70–99)
Potassium: 4.4 mEq/L (ref 3.5–5.1)
Sodium: 143 mEq/L (ref 135–145)
Total Bilirubin: 0.4 mg/dL (ref 0.2–1.2)
Total Protein: 7.6 g/dL (ref 6.0–8.3)

## 2020-05-02 LAB — CBC WITH DIFFERENTIAL/PLATELET
Basophils Absolute: 0.1 10*3/uL (ref 0.0–0.1)
Basophils Relative: 1 % (ref 0.0–3.0)
Eosinophils Absolute: 0.3 10*3/uL (ref 0.0–0.7)
Eosinophils Relative: 3.2 % (ref 0.0–5.0)
HCT: 45.6 % (ref 39.0–52.0)
Hemoglobin: 15.1 g/dL (ref 13.0–17.0)
Lymphocytes Relative: 57.3 % — ABNORMAL HIGH (ref 12.0–46.0)
Lymphs Abs: 5.3 10*3/uL — ABNORMAL HIGH (ref 0.7–4.0)
MCHC: 33.1 g/dL (ref 30.0–36.0)
MCV: 92.3 fl (ref 78.0–100.0)
Monocytes Absolute: 0.8 10*3/uL (ref 0.1–1.0)
Monocytes Relative: 8.4 % (ref 3.0–12.0)
Neutro Abs: 2.7 10*3/uL (ref 1.4–7.7)
Neutrophils Relative %: 29.4 % — ABNORMAL LOW (ref 43.0–77.0)
Platelets: 211 10*3/uL (ref 150.0–400.0)
RBC: 4.94 Mil/uL (ref 4.22–5.81)
RDW: 15 % (ref 11.5–15.5)
WBC: 9.1 10*3/uL (ref 4.0–10.5)

## 2020-05-02 MED ORDER — PHENYTOIN SODIUM EXTENDED 100 MG PO CAPS: 200.0000 mg | ORAL_CAPSULE | Freq: Every morning | ORAL | 5 refills | Status: DC

## 2020-05-02 MED ORDER — PHENYTOIN 50 MG PO CHEW: CHEWABLE_TABLET | ORAL | 5 refills | Status: DC

## 2020-05-02 MED ORDER — DIVALPROEX SODIUM ER 500 MG PO TB24: ORAL_TABLET | ORAL | 5 refills | Status: DC

## 2020-05-02 MED ORDER — LEVETIRACETAM 750 MG PO TABS: 750.0000 mg | ORAL_TABLET | Freq: Two times a day (BID) | ORAL | 5 refills | Status: DC

## 2020-05-02 NOTE — Patient Instructions (Signed)
1.  Refilled Depakote ER 2000mg  twice daily;  2.  Refilled Keppra 750mg  twice daily;  3.  Refilled Dilantin 200mg  in AM and 50mg  chewable in PM.  Also take Caltrate daily (calcium and vitamin D supplement) - you can pick it up over the counter at the pharmacy. 4.  Check CBC and CMP 5.  Follow up in one year

## 2020-05-16 ENCOUNTER — Ambulatory Visit: Payer: Medicare HMO | Admitting: Neurology

## 2020-08-04 ENCOUNTER — Emergency Department (HOSPITAL_COMMUNITY): Payer: 59

## 2020-08-04 ENCOUNTER — Encounter (HOSPITAL_COMMUNITY): Payer: Self-pay | Admitting: Radiology

## 2020-08-04 ENCOUNTER — Observation Stay (HOSPITAL_COMMUNITY)
Admission: EM | Admit: 2020-08-04 | Discharge: 2020-08-06 | Disposition: A | Discharge status: Home / Self Care | Payer: 59 | Attending: Family Medicine | Admitting: Family Medicine

## 2020-08-04 ENCOUNTER — Other Ambulatory Visit: Payer: Self-pay

## 2020-08-04 DIAGNOSIS — I10 Essential (primary) hypertension: Secondary | ICD-10-CM | POA: Diagnosis present

## 2020-08-04 DIAGNOSIS — G40209 Localization-related (focal) (partial) symptomatic epilepsy and epileptic syndromes with complex partial seizures, not intractable, without status epilepticus: Secondary | ICD-10-CM

## 2020-08-04 DIAGNOSIS — Z8673 Personal history of transient ischemic attack (TIA), and cerebral infarction without residual deficits: Secondary | ICD-10-CM | POA: Insufficient documentation

## 2020-08-04 DIAGNOSIS — F17210 Nicotine dependence, cigarettes, uncomplicated: Secondary | ICD-10-CM | POA: Diagnosis not present

## 2020-08-04 DIAGNOSIS — I517 Cardiomegaly: Secondary | ICD-10-CM | POA: Diagnosis present

## 2020-08-04 DIAGNOSIS — R079 Chest pain, unspecified: Secondary | ICD-10-CM

## 2020-08-04 DIAGNOSIS — K253 Acute gastric ulcer without hemorrhage or perforation: Secondary | ICD-10-CM

## 2020-08-04 DIAGNOSIS — R072 Precordial pain: Secondary | ICD-10-CM | POA: Diagnosis present

## 2020-08-04 DIAGNOSIS — Z79899 Other long term (current) drug therapy: Secondary | ICD-10-CM | POA: Insufficient documentation

## 2020-08-04 DIAGNOSIS — R1013 Epigastric pain: Secondary | ICD-10-CM | POA: Diagnosis not present

## 2020-08-04 DIAGNOSIS — Z20822 Contact with and (suspected) exposure to covid-19: Secondary | ICD-10-CM | POA: Diagnosis not present

## 2020-08-04 DIAGNOSIS — K8020 Calculus of gallbladder without cholecystitis without obstruction: Secondary | ICD-10-CM | POA: Diagnosis present

## 2020-08-04 DIAGNOSIS — F1410 Cocaine abuse, uncomplicated: Secondary | ICD-10-CM

## 2020-08-04 DIAGNOSIS — K2980 Duodenitis without bleeding: Secondary | ICD-10-CM | POA: Diagnosis not present

## 2020-08-04 DIAGNOSIS — I2510 Atherosclerotic heart disease of native coronary artery without angina pectoris: Secondary | ICD-10-CM | POA: Diagnosis not present

## 2020-08-04 DIAGNOSIS — K449 Diaphragmatic hernia without obstruction or gangrene: Secondary | ICD-10-CM | POA: Insufficient documentation

## 2020-08-04 DIAGNOSIS — F17200 Nicotine dependence, unspecified, uncomplicated: Secondary | ICD-10-CM | POA: Diagnosis present

## 2020-08-04 DIAGNOSIS — E119 Type 2 diabetes mellitus without complications: Secondary | ICD-10-CM | POA: Insufficient documentation

## 2020-08-04 DIAGNOSIS — I700 Atherosclerosis of aorta: Secondary | ICD-10-CM | POA: Diagnosis not present

## 2020-08-04 DIAGNOSIS — R1313 Dysphagia, pharyngeal phase: Secondary | ICD-10-CM | POA: Diagnosis present

## 2020-08-04 LAB — BASIC METABOLIC PANEL
Anion gap: 12 (ref 5–15)
BUN: 21 mg/dL (ref 8–23)
CO2: 20 mmol/L — ABNORMAL LOW (ref 22–32)
Calcium: 8.5 mg/dL — ABNORMAL LOW (ref 8.9–10.3)
Chloride: 107 mmol/L (ref 98–111)
Creatinine, Ser: 0.9 mg/dL (ref 0.61–1.24)
GFR, Estimated: >60 mL/min (ref >60)
Glucose, Bld: 88 mg/dL (ref 70–99)
Potassium: 5.1 mmol/L (ref 3.5–5.1)
Sodium: 139 mmol/L (ref 135–145)

## 2020-08-04 LAB — HEPATIC FUNCTION PANEL
ALT: 17 U/L (ref 0–44)
AST: 21 U/L (ref 15–41)
Albumin: 3.6 g/dL (ref 3.5–5.0)
Alkaline Phosphatase: 64 U/L (ref 38–126)
Bilirubin, Direct: 0.2 mg/dL (ref 0.0–0.2)
Indirect Bilirubin: 0.3 mg/dL (ref 0.3–0.9)
Total Bilirubin: 0.5 mg/dL (ref 0.3–1.2)
Total Protein: 7.1 g/dL (ref 6.5–8.1)

## 2020-08-04 LAB — CBC
HCT: 47.1 % (ref 39.0–52.0)
Hemoglobin: 15.1 g/dL (ref 13.0–17.0)
MCH: 30.9 pg (ref 26.0–34.0)
MCHC: 32.1 g/dL (ref 30.0–36.0)
MCV: 96.3 fL (ref 80.0–100.0)
Platelets: 234 10*3/uL (ref 150–400)
RBC: 4.89 MIL/uL (ref 4.22–5.81)
RDW: 14.4 % (ref 11.5–15.5)
WBC: 8.4 10*3/uL (ref 4.0–10.5)
nRBC: 0 % (ref 0.0–0.2)

## 2020-08-04 LAB — LIPASE, BLOOD: Lipase: 35 U/L (ref 11–51)

## 2020-08-04 LAB — TROPONIN I (HIGH SENSITIVITY)
Troponin I (High Sensitivity): 5 ng/L (ref <18)
Troponin I (High Sensitivity): 7 ng/L (ref <18)

## 2020-08-04 LAB — RESP PANEL BY RT-PCR (FLU A&B, COVID) ARPGX2
Influenza A by PCR: NEGATIVE
Influenza B by PCR: NEGATIVE
SARS Coronavirus 2 by RT PCR: NEGATIVE

## 2020-08-04 MED ORDER — OMEPRAZOLE 20 MG PO CPDR: 20.0000 mg | DELAYED_RELEASE_CAPSULE | Freq: Every day | ORAL | 0 refills | Status: DC

## 2020-08-04 MED ORDER — IOHEXOL 350 MG/ML SOLN
100.0000 mL | Freq: Once | INTRAVENOUS | Status: AC | PRN
  Administered 2020-08-04: 100 mL via INTRAVENOUS

## 2020-08-04 MED ORDER — ACETAMINOPHEN 650 MG RE SUPP: 650.0000 mg | Freq: Four times a day (QID) | RECTAL | Status: DC

## 2020-08-04 MED ORDER — PANTOPRAZOLE SODIUM 40 MG PO TBEC: 40.0000 mg | DELAYED_RELEASE_TABLET | Freq: Every day | ORAL | Status: DC

## 2020-08-04 MED ORDER — PANTOPRAZOLE SODIUM 40 MG PO TBEC
40.0000 mg | DELAYED_RELEASE_TABLET | Freq: Every day | ORAL | Status: DC
  Administered 2020-08-04: 40 mg via ORAL
  Filled 2020-08-04 (×2): qty 1

## 2020-08-04 MED ORDER — LIDOCAINE VISCOUS HCL 2 % MT SOLN
15.0000 mL | Freq: Once | OROMUCOSAL | Status: AC
  Administered 2020-08-04: 15 mL via ORAL
  Filled 2020-08-04: qty 15

## 2020-08-04 MED ORDER — ONDANSETRON HCL 4 MG/2ML IJ SOLN
4.0000 mg | Freq: Once | INTRAMUSCULAR | Status: AC
  Administered 2020-08-04: 4 mg via INTRAVENOUS
  Filled 2020-08-04: qty 2

## 2020-08-04 MED ORDER — PHENYTOIN 50 MG PO CHEW
50.0000 mg | CHEWABLE_TABLET | Freq: Every day | ORAL | Status: DC
  Administered 2020-08-04 – 2020-08-05 (×2): 50 mg via ORAL
  Filled 2020-08-04 (×3): qty 1

## 2020-08-04 MED ORDER — FAMOTIDINE 20 MG PO TABS: 20.0000 mg | ORAL_TABLET | Freq: Two times a day (BID) | ORAL | 0 refills | Status: DC

## 2020-08-04 MED ORDER — MORPHINE SULFATE (PF) 4 MG/ML IV SOLN
4.0000 mg | Freq: Once | INTRAVENOUS | Status: AC
Start: 2020-08-04 — End: 2020-08-04
  Administered 2020-08-04: 4 mg via INTRAVENOUS
  Filled 2020-08-04: qty 1

## 2020-08-04 MED ORDER — DIVALPROEX SODIUM ER 500 MG PO TB24
2000.0000 mg | ORAL_TABLET | Freq: Two times a day (BID) | ORAL | Status: DC
  Administered 2020-08-04 – 2020-08-06 (×4): 2000 mg via ORAL
  Filled 2020-08-04 (×5): qty 4

## 2020-08-04 MED ORDER — ALUM & MAG HYDROXIDE-SIMETH 200-200-20 MG/5ML PO SUSP
30.0000 mL | Freq: Once | ORAL | Status: AC
  Administered 2020-08-04: 30 mL via ORAL
  Filled 2020-08-04: qty 30

## 2020-08-04 MED ORDER — PHENYTOIN SODIUM EXTENDED 100 MG PO CAPS
200.0000 mg | ORAL_CAPSULE | Freq: Every morning | ORAL | Status: DC
  Administered 2020-08-04: 200 mg via ORAL
  Filled 2020-08-04: qty 2

## 2020-08-04 MED ORDER — ENOXAPARIN SODIUM 40 MG/0.4ML ~~LOC~~ SOLN
40.0000 mg | SUBCUTANEOUS | Status: DC
  Administered 2020-08-04 – 2020-08-05 (×2): 40 mg via SUBCUTANEOUS
  Filled 2020-08-04 (×2): qty 0.4

## 2020-08-04 MED ORDER — ACETAMINOPHEN 325 MG PO TABS
650.0000 mg | ORAL_TABLET | Freq: Four times a day (QID) | ORAL | Status: DC
  Administered 2020-08-05 (×3): 650 mg via ORAL
  Filled 2020-08-04 (×4): qty 2

## 2020-08-04 MED ORDER — CALCIUM CARBONATE ANTACID 500 MG PO CHEW
4.0000 | CHEWABLE_TABLET | Freq: Once | ORAL | Status: AC
  Administered 2020-08-04: 800 mg via ORAL
  Filled 2020-08-04: qty 4

## 2020-08-04 MED ORDER — LEVETIRACETAM 750 MG PO TABS
750.0000 mg | ORAL_TABLET | Freq: Two times a day (BID) | ORAL | Status: DC
  Administered 2020-08-04 – 2020-08-06 (×4): 750 mg via ORAL
  Filled 2020-08-04 (×5): qty 1

## 2020-08-04 NOTE — ED Notes (Signed)
Pt refuses to go home, states the pain comes and goes, but is currently pain free, PA peter notified, upon Collier Salina coming into pt room, pt verbally aggressive and stating that Collier Salina and I had said things that we have not said.  Then, pt apologizes and starts talking randomly about now nobody knows any body elses "body", reassurance given

## 2020-08-04 NOTE — ED Notes (Signed)
Awaiting d/c instructions.  Pt given food - ok per PA

## 2020-08-04 NOTE — ED Notes (Signed)
Tried to call report to floor.  RN to call back

## 2020-08-04 NOTE — ED Notes (Signed)
Attempted to place IV. Unsuccessful. Blood work obtained and sent to lab. Pt to xray at this time.

## 2020-08-04 NOTE — ED Triage Notes (Addendum)
Pt presents to the ED with generalized chest pain that comes from the epigastric region and wraps around to the back. Pain started when patient was laying down. EMS vital showed HTN 176/108. 12 lead unremarkable. Pt rec'd 324mg  ASA

## 2020-08-04 NOTE — ED Notes (Signed)
I tried to call report again, spoke with Estill Bamberg, states she will have the nurse call me back,  She is aware that this is the second time I called

## 2020-08-04 NOTE — ED Notes (Signed)
Pt now c/o pain, states "its rolling and rolling" described as gas pains, PA notified

## 2020-08-04 NOTE — ED Notes (Signed)
ED Provider at bedside. 

## 2020-08-04 NOTE — ED Provider Notes (Addendum)
[facility] - Inpatient EMERGENCY DEPARTMENT Provider Note   CSN: [id number] Arrival date & time: 08/04/20  0539     History Chief Complaint  Patient presents with  . Chest Pain    [name] is a 65 y.o. male.  Patient with history of seizure disorder, diabetes but his doctor took him off of his medication --presents to the emergency department today for evaluation of chest pain that started at rest.  He states that the chest pain has been ongoing for the past 3 days.  It is in the mid chest with radiation to the back.  It is worse with lying flat and better when he sits up.  It is intermittent and not made worse with exertion.  No associated shortness of breath or trouble breathing.  No cough or fever.  Patient states that drinking warm water also helps.  He has pain in the epigastrium but nowhere else in the abdomen.  No associated diaphoresis or vomiting.  No diarrhea or urinary symptoms.  No treatments prior to arrival.  Not changed with food.  Patient denies risk factors for pulmonary embolism including: unilateral leg swelling, history of DVT/PE/other blood clots, use of exogenous hormones, recent immobilizations, recent surgery, recent travel (>4hr segment), malignancy, hemoptysis.           Past Medical History:  Diagnosis Date  . Blindness of right eye   . Diabetes mellitus   . Epilepsy ([place])    As reported by patient  . Seizures ([place])    since age 55;last seizure 3-4 years ago per pt  . Stroke [facility]) 2 years ago    Patient Active Problem List   Diagnosis Date Noted  . Leukocytosis 01/24/2017  . Overdose 01/24/2017  . Hypertension 01/24/2017  . Tachycardia 01/24/2017  . Back pain 01/24/2017  . Sciatica of right side 04/30/2016  . Fever 04/30/2016  . Localization-related symptomatic epilepsy and epileptic syndromes with complex partial seizures, not intractable, without status epilepticus ([place]) 01/29/2015  . Blindness of right eye   . LUNG ABSCESS  03/14/2008    Past Surgical History:  Procedure Laterality Date  . NO PAST SURGERIES         Family History  Problem Relation Age of Onset  . Diabetes Mother   . Hypertension Mother   . Colon cancer Neg Hx     Social History   Tobacco Use  . Smoking status: Current Every Day Smoker    Packs/day: 0.25    Types: Cigarettes  . Smokeless tobacco: Never Used  Vaping Use  . Vaping Use: Never used  Substance Use Topics  . Alcohol use: Yes    Comment: one 40 oz beer each day  . Drug use: Yes    Types: Cocaine    Comment: cocaine--weekly per pt; last time couple days ago    Home Medications Prior to Admission medications   Medication Sig Start Date End Date Taking? Authorizing Provider  acetaminophen (TYLENOL) 325 MG tablet Take 2 tablets (650 mg total) by mouth every 6 (six) hours as needed for mild pain. 05/02/16   Arrien, Jimmy Picket, MD  cephALEXin (KEFLEX) 500 MG capsule Take 1 capsule (500 mg total) by mouth 4 (four) times daily. Patient not taking: Reported on 05/02/2020 09/20/19   Daleen Bo, MD  divalproex (DEPAKOTE ER) 500 MG 24 hr tablet Take 4 tablets twice daily 05/02/20   Pieter Partridge, DO  HYDROcodone-acetaminophen (NORCO/VICODIN) 5-325 MG tablet Take 1 tablet by mouth every 4 (  four) hours as needed for moderate pain. Patient not taking: Reported on 05/02/2020 09/20/19   Daleen Bo, MD  levETIRAcetam (KEPPRA) 750 MG tablet Take 1 tablet (750 mg total) by mouth 2 (two) times daily. 05/02/20   Pieter Partridge, DO  phenytoin (DILANTIN) 100 MG ER capsule Take 2 capsules (200 mg total) by mouth in the morning. 05/02/20   Pieter Partridge, DO  phenytoin (PHENYTOIN INFATABS) 50 MG tablet CHEW 1 TABLET (50 MG TOTAL) BY MOUTH AT BEDTIME. 05/02/20   Pieter Partridge, DO    Allergies    Patient has no known allergies.  Review of Systems   Review of Systems  Constitutional: Negative for diaphoresis and fever.  Eyes: Negative for redness.  Respiratory: Negative for cough and  shortness of breath.   Cardiovascular: Positive for chest pain. Negative for palpitations and leg swelling.  Gastrointestinal: Negative for abdominal pain, nausea and vomiting.  Genitourinary: Negative for dysuria.  Musculoskeletal: Positive for back pain. Negative for neck pain.  Skin: Negative for rash.  Neurological: Negative for syncope and light-headedness.  Psychiatric/Behavioral: The patient is not nervous/anxious.     Physical Exam Updated Vital Signs BP (!) 178/98   Pulse 74   Temp 98 F (36.7 C) (Oral)   Resp 14   Ht 5\' 5"  (1.651 m)   Wt 67.6 kg   SpO2 99%   BMI 24.80 kg/m   Physical Exam Vitals and nursing note reviewed.  Constitutional:      Appearance: He is well-developed. He is not diaphoretic.  HENT:     Head: Normocephalic and atraumatic.     Mouth/Throat:     Mouth: Mucous membranes are not dry.  Eyes:     Conjunctiva/sclera: Conjunctivae normal.  Neck:     Vascular: Normal carotid pulses. No carotid bruit or JVD.     Trachea: Trachea normal. No tracheal deviation.  Cardiovascular:     Rate and Rhythm: Normal rate and regular rhythm.     Pulses: No decreased pulses.     Heart sounds: Normal heart sounds, S1 normal and S2 normal. Heart sounds not distant. No murmur heard.   Pulmonary:     Effort: Pulmonary effort is normal. No respiratory distress.     Breath sounds: Normal breath sounds. No wheezing.  Chest:     Chest wall: No tenderness.  Abdominal:     General: Bowel sounds are normal.     Palpations: Abdomen is soft.     Tenderness: There is no abdominal tenderness. There is no guarding or rebound.  Musculoskeletal:     Cervical back: Normal range of motion and neck supple. No muscular tenderness.     Left lower leg: No tenderness.  Skin:    General: Skin is warm and dry.     Coloration: Skin is not pale.  Neurological:     Mental Status: He is alert.     ED Results / Procedures / Treatments   Labs (all labs ordered are listed, but  only abnormal results are displayed) Labs Reviewed  BASIC METABOLIC PANEL - Abnormal; Notable for the following components:      Result Value   CO2 20 (*)    Calcium 8.5 (*)    All other components within normal limits  RESP PANEL BY RT-PCR (FLU A&B, COVID) ARPGX2  CBC  LIPASE, BLOOD  HEPATIC FUNCTION PANEL  TROPONIN I (HIGH SENSITIVITY)  TROPONIN I (HIGH SENSITIVITY)    EKG EKG Interpretation  Date/Time:  Sunday August 04 2020 06:17:33 EDT Ventricular Rate:  66 PR Interval:  149 QRS Duration: 85 QT Interval:  388 QTC Calculation: 407 R Axis:   -22 Text Interpretation: Sinus rhythm Consider left ventricular hypertrophy When compared with ECG of 01/23/2017, HEART RATE has decreased Confirmed by Delora Fuel ([id number]) on 08/04/2020 6:21:39 AM   Radiology DG Chest 2 View  Result Date: 08/04/2020 CLINICAL DATA:  65 year old male with history of generalized chest pain. EXAM: CHEST - 2 VIEW COMPARISON:  Chest x-ray 01/23/2017. FINDINGS: Lung volumes are normal. No consolidative airspace disease. No pleural effusions. No pneumothorax. No pulmonary nodule or mass noted. Pulmonary vasculature and the cardiomediastinal silhouette are within normal limits. IMPRESSION: No radiographic evidence of acute cardiopulmonary disease. Electronically Signed   By: Vinnie Langton M.D.   On: 08/04/2020 06:53    Procedures Procedures   Medications Ordered in ED Medications  alum & mag hydroxide-simeth (MAALOX/MYLANTA) 200-200-20 MG/5ML suspension 30 mL (30 mLs Oral Given 08/04/20 0749)    And  lidocaine (XYLOCAINE) 2 % viscous mouth solution 15 mL (15 mLs Oral Given 08/04/20 0750)  morphine 4 MG/ML injection 4 mg (4 mg Intravenous Given 08/04/20 0820)  ondansetron (ZOFRAN) injection 4 mg (4 mg Intravenous Given 08/04/20 0820)  iohexol (OMNIPAQUE) 350 MG/ML injection 100 mL (100 mLs Intravenous Contrast Given 08/04/20 0925)  calcium carbonate (TUMS - dosed in mg elemental calcium) chewable tablet 800 mg  of elemental calcium (800 mg of elemental calcium Oral Given 08/04/20 1106)    ED Course  I have reviewed the triage vital signs and the nursing notes.  Pertinent labs & imaging results that were available during my care of the patient were reviewed by me and considered in my medical decision making (see chart for details).  Patient seen and examined.  Pain is atypical for ACS.  Patient had several episodes of chest pain during my exam and had to be repositioned in the bed.  EKG/CXR reviewed.  Awaiting labs.    Vital signs reviewed and are as follows: BP (!) 178/98   Pulse 74   Temp 98 F (36.7 C) (Oral)   Resp 14   Ht 5\' 5"  (1.651 m)   Wt 67.6 kg   SpO2 99%   BMI 24.80 kg/m   8:02 AM patient rechecked and updated on results this point.  He continues to complain of intermittent chest pain, yelling out and clutching chest at times.  States that the GI cocktail did not do anything for him.  He complains of pain to the back but it is really his mid back and not upper back.  Will give additional medication for pain, perform dissection study to rule out vascular etiology of symptoms given elevated blood pressure.  10:45 AM patient with resolution of symptoms after morphine.  He continues to do well.  CT imaging did not show any evidence of dissection.  He does have atherosclerotic plaque as noted above.  I discussed briefly with Dr. Rayann Heman for cardiology by telephone.  States that patient will likely need a stress test in the future.   I discussed with patient risks and benefits of inpatient versus outpatient treatment and evaluation.  Discussed that symptoms today are atypical for heart attack and his cardiac enzymes, EKG, and chest x-ray do not point towards him having a heart attack at the current time.  Also discussed that he does have signs of coronary artery disease which puts him at higher risk for heart attack.  Discussed that he will need  to see a cardiologist, and that he can do this  as an outpatient.  I also offered inpatient evaluation for further testing.  After discussion, patient would like to go home today.  He understands that he will need to call the cardiology referral tomorrow to schedule an appointment.  He also states that if he develops any recurrent chest pain or if his symptoms are worse, change, or become more severe, he will come back to the emergency department.  RN at bedside during discussion.   Patient was counseled to return with severe chest pain, especially if the pain is crushing or pressure-like and spreads to the arms, back, neck, or jaw, or if they have sweating, nausea, or shortness of breath with the pain. They were encouraged to call 911 with these symptoms.   They were also told to return if their chest pain gets worse and does not go away with rest, they have an attack of chest pain lasting longer than usual despite rest and treatment with the medications their caregiver has prescribed, if they wake from sleep with chest pain or shortness of breath, if they feel dizzy or faint, if they have chest pain not typical of their usual pain, or if they have any other emergent concerns regarding their health.  10:52 AM Pt had recurrent spasms in chest, intermittent, lasting a few seconds, while eating sandwich. Tums ordered. Symptoms continue to be very atypical for ACS.   11:38 AM Called back to room.  Pain is still present.  Patient has now changed his mind and is requesting admission to the hospital for continued chest pain evaluation.  He is more argumentative now, states the last time he was sent out when he felt that he had a seizure in a parking lot.  When asked if he needs anything for pain, he states not right now.   Will request inpatient observation for chest pain.   11:57 AM Discussed with family practice who will see.      MDM Rules/Calculators/A&P                          Admit. Moderate risk CP, ongoing, signs of CAD on CT today.    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

## 2020-08-04 NOTE — Hospital Course (Addendum)
[name] is a 65 y.o. male with a history of HTN, seizure disorder, blindness in right eye, current smoker, alcohol use, cocaine use who presented with epigastric pain found to have gastric ulcer. Hospital course outlined by problem below:  PUD Patient presented with 3 days of intermittent epigastric/chest pain.  In the ED, he had negative ACS work-up and no evidence of aortic dissection on CTA.  EGD was performed on 4/12 with findings of 15 mm nonbleeding gastric ulcer and duodenitis.  He was discharged on twice daily PPI, as needed H2 blocker, and Gaviscon.  Coronary calcifications Patient was noted to have severe atherosclerotic calcifications in the LAD and LCx seen on CTA.  TTE was obtained which revealed EF 60 to 65% with grade 1 diastolic dysfunction.  Cardiology was consulted and arranged for outpatient follow-up, considering outpatient CCTA at follow-up.  HTN Patient was hypertensive on admission up to 168/120.  BP was intermittently elevated throughout admission and he was started on amlodipine 5 mg during hospitalization.  Other problems chronic and stable.

## 2020-08-04 NOTE — ED Notes (Signed)
ED Provider at bedside, PA peter

## 2020-08-04 NOTE — Progress Notes (Signed)
Patient noted to have high blood pressures in the ER but it seems they have trended upward. I notified Dr. Vanessa [place] and he will address with next MD. No plans for orders right now, to see how he does over the next few hrs and may consider starting him on something this evening. I informed the patient. No further orders. Will monitor blood pressures.

## 2020-08-04 NOTE — Progress Notes (Signed)
Patient transferred from ED at 1400hrs.  Oriented to room and plan of care for shift. Patient verbalized understanding.  Denies pain at this time.

## 2020-08-04 NOTE — H&P (Addendum)
[facility] Admission History and Physical Service Pager: [phone]  Patient name: [name] Medical record number: [id number] Date of birth: 1955/08/30 Age: 65 y.o. Gender: male  Primary Care Provider: Burman Riis, NP Consultants: none Code Status: Full [phone] - Corlis Leak - brother   Chief Complaint: chest pain  Assessment and Plan: Dustin TEBBETTS is a 65 y.o. male presenting with epigastric pain . PMH is significant for HTN, current smoker, alcohol use, cocaine use, seizure disorder, blindness in right eye.  Epigastric pain Patient presenting with 3 days of intermittent epigastric/chest pain.  Cardiac work-up done in the ED unremarkable, low likelihood of ACS (troponins normal and flat, no ST changes on EKG) or aortic dissection (unremarkable CTA).  Upon further questioning, pain appears to be more epigastric, worse with eating.  Suspect acute gastritis in the setting of NSAID use (recently started taking meloxicam for hip pain about 10 days ago) with other high risk factors (smoking, alcohol use).  No red flag symptoms such as hematemesis, melena, or hematochezia to suggest PUD, do not think there is urgent need for EGD based on short duration of symptoms and normal hemoglobin.  Patient does have a history of heartburn intermittently taking baking soda though unclear how long he has had this, could consider EGD outpatient.  Will admit patient for observation and pain control.  Obtaining risk stratification labs in the morning. - admit to med-tele, Dr. McDiarmid attending - start pantoprazole 40 mg daily - scheduled APAP - avoid NSAIDs/discontinue home meloxicam - AM labs: CBC, BMP, lipid panel, A1c, HIV screening - vitals per unit routine -Cardiac monitoring x24 hours  Coronary calcifications Severe proximal left anterior descending and left circumflex coronary arterial atherosclerotic calcifications seen on CTA.  Pain atypical, not in  ACS based on normal troponins and nonischemic EKG. no aortic dissection seen on CTA - coordinate cardiology f/u prior to discharge - cardiac monitoring x 24 hrs  HTN Hypertensive up to 168/120 on admission.  Not on any antihypertensives prior to admission.  Possibly secondary to pain.  Will monitor and consider starting antihypertensive if remaining hypertensive tomorrow. -Consider antihypertensive at discharge if appropriate  Seizure disorder Home medications include VPA 2000 mg twice daily, levetiracetam 750 mg twice daily, phenytoin (200 mg in the morning, 50 mg at bedtime). - continue home medications  Alcohol use Intermittent alcohol use averaging 3.5 drinks a day and sometimes up to half a case a day, last drink reported to be 3 days ago. - monitor CIWA x 24 hrs (no lorazepam) - encourage cessation  Tobacco use Smoker since the age of 12, currently reports smoking 3 to 5 cigarettes a day. - encourage cessation  History of cocaine use, history of accidental drug overdose on Flexeril: Patient admits using cocaine about once a week, last use a few days ago.  Per chart review he had an encounter for accidental drug overdose and 2018 from Flexeril tried to improve his back pain.   FEN/GI: heart-healthy diet Prophylaxis: LMWH  Disposition: med-tele  History of Present Illness:  Dustin DISSINGER is a 65 y.o. male presenting with chest/epigastric pain.  He states his pain started about 3 days ago but got worse this morning at 6AM which is why he came into the hospital. He experiences epigastric pain that radiates to his back on occasion. He says pain lasts 4-5 mins or sometimes a bit longer and makes it difficult to sleep. He says pain is worse after eating. He switched  to soup for one day and noticed the pain improved significantly but then worsens. Never had pain like this before 3 days ago. His pain is a burning sensation in his epigastric region and sometimes toward his back. No  shortness of breath. Pain is worse at night when trying to sleep and patient does regularly eat late at night. Has a lot of heart burn in the past and which bothers him mostly after spicy foods. He has used baking soda to help that. His pain started a few days after starting meloxicam.  In the ED his troponins were flat and CTA showed no dissection or PE. It did show calcification in the arteries supplying his heart. Patient was given the option of outpatient cardiology follow up vs hospitalization and initially opted to follow up outpatient but had another bout of pain and decided he preferred to be admitted.   Drinks alcohol - sometimes nothing, sometimes 1/2 case a day with a friend, says average of 3.5 drinks a day. Last drink 2 days ago.  Smokes about 3-5 cigarettes per day, smokes about half each cigarette for 40-50 years.  Does use cocaine on occasion, last use 3 days ago. No other drugs.   Review Of Systems: Per HPI with the following additions:   Review of Systems  Constitutional: Negative for chills, fever and weight loss.  HENT: Negative for congestion.   Respiratory: Negative for cough and shortness of breath.   Cardiovascular: Positive for chest pain.  Gastrointestinal: Positive for abdominal pain (epigastric). Negative for blood in stool, diarrhea, melena and vomiting.  Genitourinary: Negative for dysuria.  Neurological: Negative for dizziness.    Patient Active Problem List   Diagnosis Date Noted  . Chest pain 08/04/2020  . Leukocytosis 01/24/2017  . Overdose 01/24/2017  . Hypertension 01/24/2017  . Tachycardia 01/24/2017  . Back pain 01/24/2017  . Sciatica of right side 04/30/2016  . Fever 04/30/2016  . Localization-related symptomatic epilepsy and epileptic syndromes with complex partial seizures, not intractable, without status epilepticus ([place]) 01/29/2015  . Blindness of right eye   . LUNG ABSCESS 03/14/2008    Past Medical History: Past Medical History:   Diagnosis Date  . Blindness of right eye   . Diabetes mellitus   . Epilepsy ([place])    As reported by patient  . Seizures ([place])    since age 69;last seizure 3-4 years ago per pt  . Stroke [facility]) 2 years ago    Past Surgical History: Past Surgical History:  Procedure Laterality Date  . NO PAST SURGERIES      Social History: Social History   Tobacco Use  . Smoking status: Current Every Day Smoker    Packs/day: 0.25    Types: Cigarettes  . Smokeless tobacco: Never Used  Vaping Use  . Vaping Use: Never used  Substance Use Topics  . Alcohol use: Yes    Comment: one 40 oz beer each day  . Drug use: Yes    Types: Cocaine    Comment: cocaine--weekly per pt; last time couple days ago   Additional social history: Drinks alcohol - sometimes nothing, sometimes 1/2 case a day with a friend, says average of 3.5 drinks a day. Last drink 2 days ago.  Smokes about 3-5 cigarettes per day, smokes about half each cigarette for 40-50 years.  Does use cocaine on occasion, last use 3 days ago. No other drugs.   Please also refer to relevant sections of EMR.  Family History: Family History  Problem  Relation Age of Onset  . Diabetes Mother   . Hypertension Mother   . Colon cancer Neg Hx     Allergies and Medications: No Known Allergies Current Facility-Administered Medications on File Prior to Encounter  Medication Dose Route Frequency Provider Last Rate Last Admin  . 0.9 %  sodium chloride infusion  500 mL Intravenous Once Doran Stabler, MD       Current Outpatient Medications on File Prior to Encounter  Medication Sig Dispense Refill  . acetaminophen (TYLENOL) 325 MG tablet Take 2 tablets (650 mg total) by mouth every 6 (six) hours as needed for mild pain. 30 tablet 0  . divalproex (DEPAKOTE ER) 500 MG 24 hr tablet Take 4 tablets twice daily 240 tablet 5  . levETIRAcetam (KEPPRA) 750 MG tablet Take 1 tablet (750 mg total) by mouth 2 (two) times daily. 60 tablet 5  . phenytoin  (DILANTIN) 100 MG ER capsule Take 2 capsules (200 mg total) by mouth in the morning. 60 capsule 5  . phenytoin (PHENYTOIN INFATABS) 50 MG tablet CHEW 1 TABLET (50 MG TOTAL) BY MOUTH AT BEDTIME. 30 tablet 5    Objective: BP (!) 168/120   Pulse 61   Temp 98.2 F (36.8 C) (Oral)   Resp 12   Ht 5\' 5"  (1.651 m)   Wt 67.6 kg   SpO2 98%   BMI 24.80 kg/m  Exam: General: Older male resting in bed comfortably, NAD Eyes: blind in right eye at baseline, left eye pupils reactive to light ENTM: [place]/AT, MMM Neck: supple Cardiovascular: RRR, no murmurs, pain not reproducible, no edema Respiratory: CTAB, no respiratory distress Gastrointestinal: soft, tenderness to epigastric region, +BS, no acute peritoneal findings MSK: no deformity Derm: warm, dry Neuro: alert, interactive Psych: affect appropriate  Labs and Imaging: CBC BMET  Recent Labs  Lab 08/04/20 0624  WBC 8.4  HGB 15.1  HCT 47.1  PLT 234   Recent Labs  Lab 08/04/20 0624  NA 139  K 5.1  CL 107  CO2 20*  BUN 21  CREATININE 0.90  GLUCOSE 88  CALCIUM 8.5Zola Button, MD 08/04/2020, 12:13 PM PGY-1, [place] [facility] pager: [phone], text pages welcome   Upper Level Addendum:  I have seen and evaluated this patient along with Dr. Nancy Fetter and reviewed the above note, making necessary revisions as appropriate.  I agree with the medical decision making and physical exam as noted above.  Lurline Del, DO PGY-2  [facility] Family Medicine Residency

## 2020-08-05 ENCOUNTER — Observation Stay (HOSPITAL_BASED_OUTPATIENT_CLINIC_OR_DEPARTMENT_OTHER): Payer: 59

## 2020-08-05 ENCOUNTER — Encounter (HOSPITAL_COMMUNITY): Payer: Self-pay | Admitting: Family Medicine

## 2020-08-05 DIAGNOSIS — I700 Atherosclerosis of aorta: Secondary | ICD-10-CM | POA: Diagnosis not present

## 2020-08-05 DIAGNOSIS — G40209 Localization-related (focal) (partial) symptomatic epilepsy and epileptic syndromes with complex partial seizures, not intractable, without status epilepticus: Secondary | ICD-10-CM

## 2020-08-05 DIAGNOSIS — F17200 Nicotine dependence, unspecified, uncomplicated: Secondary | ICD-10-CM

## 2020-08-05 DIAGNOSIS — R0789 Other chest pain: Secondary | ICD-10-CM | POA: Diagnosis not present

## 2020-08-05 DIAGNOSIS — I517 Cardiomegaly: Secondary | ICD-10-CM

## 2020-08-05 DIAGNOSIS — R1013 Epigastric pain: Secondary | ICD-10-CM | POA: Diagnosis not present

## 2020-08-05 DIAGNOSIS — R079 Chest pain, unspecified: Secondary | ICD-10-CM

## 2020-08-05 DIAGNOSIS — R1313 Dysphagia, pharyngeal phase: Secondary | ICD-10-CM | POA: Diagnosis present

## 2020-08-05 DIAGNOSIS — I2510 Atherosclerotic heart disease of native coronary artery without angina pectoris: Secondary | ICD-10-CM

## 2020-08-05 DIAGNOSIS — F1410 Cocaine abuse, uncomplicated: Secondary | ICD-10-CM

## 2020-08-05 DIAGNOSIS — I10 Essential (primary) hypertension: Secondary | ICD-10-CM

## 2020-08-05 DIAGNOSIS — K8020 Calculus of gallbladder without cholecystitis without obstruction: Secondary | ICD-10-CM

## 2020-08-05 HISTORY — DX: Atherosclerosis of aorta: I70.0

## 2020-08-05 HISTORY — DX: Atherosclerotic heart disease of native coronary artery without angina pectoris: I25.10

## 2020-08-05 HISTORY — DX: Nicotine dependence, unspecified, uncomplicated: F17.200

## 2020-08-05 HISTORY — DX: Cardiomegaly: I51.7

## 2020-08-05 HISTORY — DX: Calculus of gallbladder without cholecystitis without obstruction: K80.20

## 2020-08-05 LAB — RAPID URINE DRUG SCREEN, HOSP PERFORMED
Amphetamines: NOT DETECTED
Barbiturates: NOT DETECTED
Benzodiazepines: NOT DETECTED
Cocaine: POSITIVE — AB
Opiates: POSITIVE — AB
Tetrahydrocannabinol: NOT DETECTED

## 2020-08-05 LAB — LIPID PANEL
Cholesterol: 171 mg/dL (ref 0–200)
HDL: 62 mg/dL (ref >40)
LDL Cholesterol: 87 mg/dL (ref 0–99)
Total CHOL/HDL Ratio: 2.8 RATIO
Triglycerides: 110 mg/dL (ref <150)
VLDL: 22 mg/dL (ref 0–40)

## 2020-08-05 LAB — ECHOCARDIOGRAM COMPLETE
AR max vel: 2.27 cm2
AV Area VTI: 2.42 cm2
AV Area mean vel: 2.4 cm2
AV Mean grad: 3 mmHg
AV Peak grad: 5.1 mmHg
Ao pk vel: 1.13 m/s
Area-P 1/2: 3.85 cm2
Calc EF: 63.2 %
Height: 65 in
S' Lateral: 2.15 cm
Single Plane A2C EF: 68 %
Single Plane A4C EF: 55.9 %
Weight: 2241.6 oz

## 2020-08-05 LAB — BASIC METABOLIC PANEL
Anion gap: 5 (ref 5–15)
BUN: 19 mg/dL (ref 8–23)
CO2: 28 mmol/L (ref 22–32)
Calcium: 8.9 mg/dL (ref 8.9–10.3)
Chloride: 106 mmol/L (ref 98–111)
Creatinine, Ser: 1.09 mg/dL (ref 0.61–1.24)
GFR, Estimated: >60 mL/min (ref >60)
Glucose, Bld: 85 mg/dL (ref 70–99)
Potassium: 3.9 mmol/L (ref 3.5–5.1)
Sodium: 139 mmol/L (ref 135–145)

## 2020-08-05 LAB — CBC
HCT: 42.1 % (ref 39.0–52.0)
Hemoglobin: 14.1 g/dL (ref 13.0–17.0)
MCH: 30.9 pg (ref 26.0–34.0)
MCHC: 33.5 g/dL (ref 30.0–36.0)
MCV: 92.1 fL (ref 80.0–100.0)
Platelets: 199 10*3/uL (ref 150–400)
RBC: 4.57 MIL/uL (ref 4.22–5.81)
RDW: 14.2 % (ref 11.5–15.5)
WBC: 8.7 10*3/uL (ref 4.0–10.5)
nRBC: 0 % (ref 0.0–0.2)

## 2020-08-05 LAB — HIV ANTIBODY (ROUTINE TESTING W REFLEX): HIV Screen 4th Generation wRfx: NONREACTIVE

## 2020-08-05 LAB — HEMOGLOBIN A1C
Hgb A1c MFr Bld: 5.7 % — ABNORMAL HIGH (ref 4.8–5.6)
Mean Plasma Glucose: 116.89 mg/dL

## 2020-08-05 MED ORDER — PHENYTOIN SODIUM EXTENDED 100 MG PO CAPS
100.0000 mg | ORAL_CAPSULE | Freq: Two times a day (BID) | ORAL | Status: DC
  Administered 2020-08-05: 100 mg via ORAL
  Filled 2020-08-05 (×3): qty 1

## 2020-08-05 MED ORDER — ALUM HYDROXIDE-MAG CARBONATE 95-358 MG/15ML PO SUSP
30.0000 mL | Freq: Three times a day (TID) | ORAL | Status: DC
  Filled 2020-08-05 (×6): qty 30

## 2020-08-05 MED ORDER — PANTOPRAZOLE SODIUM 40 MG PO TBEC
40.0000 mg | DELAYED_RELEASE_TABLET | Freq: Two times a day (BID) | ORAL | Status: DC
  Administered 2020-08-05 – 2020-08-06 (×2): 40 mg via ORAL
  Filled 2020-08-05: qty 2

## 2020-08-05 MED ORDER — ALUM HYDROXIDE-MAG CARBONATE 95-358 MG/15ML PO SUSP
30.0000 mL | Freq: Three times a day (TID) | ORAL | Status: DC
  Administered 2020-08-05 – 2020-08-06 (×2): 30 mL via ORAL
  Filled 2020-08-05 (×6): qty 30

## 2020-08-05 MED ORDER — ALUM & MAG HYDROXIDE-SIMETH 200-200-20 MG/5ML PO SUSP
30.0000 mL | ORAL | Status: DC | PRN
  Administered 2020-08-05 (×2): 30 mL via ORAL
  Filled 2020-08-05 (×2): qty 30

## 2020-08-05 MED ORDER — FAMOTIDINE 20 MG PO TABS: 20.0000 mg | ORAL_TABLET | Freq: Every day | ORAL | Status: DC | PRN

## 2020-08-05 MED ORDER — PHENYTOIN SODIUM EXTENDED 100 MG PO CAPS: 100.0000 mg | ORAL_CAPSULE | Freq: Every morning | ORAL | Status: DC

## 2020-08-05 MED ORDER — FAMOTIDINE 20 MG PO TABS
20.0000 mg | ORAL_TABLET | Freq: Once | ORAL | Status: AC
  Administered 2020-08-05: 20 mg via ORAL
  Filled 2020-08-05: qty 1

## 2020-08-05 MED ORDER — AMLODIPINE BESYLATE 5 MG PO TABS
5.0000 mg | ORAL_TABLET | Freq: Every day | ORAL | Status: DC
  Administered 2020-08-05 – 2020-08-06 (×2): 5 mg via ORAL
  Filled 2020-08-05 (×2): qty 1

## 2020-08-05 NOTE — H&P (View-Only) (Signed)
[facility] Consult: 12:46 PM 08/05/2020  LOS: 0 days    Referring Provider: Dr McDiarmid.   Primary Care Physician:  Burman Riis, NP Primary Gastroenterologist:  Dr. Loletha Carrow   Reason for Consultation: Epigastric pain.   HPI: KORBYN CHOPIN is a 65 y.o. male.  PMH CVA, not on platelet altering or anticoagulation meds..  Lung abscess.  Seizures.  DM, not on any meds.  Left eye blindness.  Substance abuse including alcohol, cocaine.  04/2017 colonoscopy.  Routine screening.  Left and right colon diverticulosis.  Otherwise normal study.  Suggest follow-up screening in 10 years.  Patient smoked cocaine a week ago Monday.  On Wednesday he mild chest and abdominal discomfort.  They seem to be 2 separate entities 1 is pain across the mid chest bilaterally that is not exertional.  The other pain is across the mid abdomen at about the level of the umbilicus.  Neither of these pains is exacerbated by food.  Accompanying this was nausea but no emesis.  Was taking baking soda mixed with water hoping that would help, its effect was equivocal.  Over this course of time he was still having daily brown stools.  He smoked cocaine again on Friday and says that it made him feel better.  Did okay on Saturday's though the vague pain persisted.  He was having no trouble breathing.  On Sunday the pain got very severe and he came to the emergency room.  No previous similar pain.  Does not tend to suffer from heartburn or GI upset.  No gastric acid controlling agents at home. At presentation to ED triage 6 AM yesterday he was hypertensive at 178/98.  One reading was as high as 168/120.  No tachycardia.  No hypoxia, no fever.  EKG unremarkable. Treated with aspirin.  2V CXR: Unremarkable. CT angio of chest/abdomen/pelvis: 8 mm  hyperattenuating foci and medial aspect of segment 2, most compatible with flash filling hepatic hemangioma.  Uncomplicated diverticulosis.  Uncomplicated gallstones.  Vascular findings included severe proximal left anterior descending and left circumflex coronary arterial atherosclerotic calcifications and aortic atherosclerosis.  Patent celiac, SMA, IMA.  High-sensitivity troponins negative. LFTs, renal function, lipase normal. Hgb 14.1.  Normal WBCs and platelets. No UA.  Treatments thus far include doses of antacids, viscous lidocaine, morphine oral famotidine started this morning.  He has received 2 doses of Protonix 40 mg po.     Smokes cocaine.  Admits to drinking 40 to 80 ounces of beer daily.  Past Medical History:  Diagnosis Date  . Atherosclerosis of aorta ([place]) 08/05/2020  . Blindness of right eye   . Diabetes mellitus   . Epilepsy ([place])    As reported by patient  . LUNG ABSCESS 03/14/2008   Qualifier: Diagnosis of  By: Melvyn Novas MD, Christena Deem   . Multiple gallstones 08/05/2020   CTA 08/04/20: multiple peripherally calcified infundibular gallstones  . Overdose 01/24/2017  . Sciatica of right side 04/30/2016  . Seizures ([place])    since age 35;last seizure 3-4 years ago per pt  . Stroke (  [place]) 2 years ago    Past Surgical History:  Procedure Laterality Date  . NO PAST SURGERIES      Prior to Admission medications   Medication Sig Start Date End Date Taking? Authorizing Provider  acetaminophen (TYLENOL) 325 MG tablet Take 2 tablets (650 mg total) by mouth every 6 (six) hours as needed for mild pain. 05/02/16  Yes Arrien, Jimmy Picket, MD  Cholecalciferol (VITAMIN D) 50 MCG (2000 UT) tablet Take 2,000 Units by mouth daily. 07/24/20  Yes [provider]  cholecalciferol (VITAMIN D3) 25 MCG (1000 UNIT) tablet Take 1,000 Units by mouth daily.   Yes [provider]  divalproex (DEPAKOTE ER) 500 MG 24 hr tablet Take 4 tablets twice daily 05/02/20  Yes Jaffe, Adam R, DO   famotidine (PEPCID) 20 MG tablet Take 1 tablet (20 mg total) by mouth 2 (two) times daily. 08/04/20  Yes Carlisle Cater, PA-C  levETIRAcetam (KEPPRA) 750 MG tablet Take 1 tablet (750 mg total) by mouth 2 (two) times daily. 05/02/20  Yes Tomi Likens, Adam R, DO  omeprazole (PRILOSEC) 20 MG capsule Take 1 capsule (20 mg total) by mouth daily. 08/04/20  Yes Carlisle Cater, PA-C  phenytoin (DILANTIN) 100 MG ER capsule Take 2 capsules (200 mg total) by mouth in the morning. Patient taking differently: Take 200 mg by mouth 2 (two) times daily. 05/02/20  Yes Tomi Likens, Adam R, DO  phenytoin (PHENYTOIN INFATABS) 50 MG tablet CHEW 1 TABLET (50 MG TOTAL) BY MOUTH AT BEDTIME. 05/02/20  Yes Jaffe, Adam R, DO    Scheduled Meds: . acetaminophen  650 mg Oral Q6H   Or  . acetaminophen  650 mg Rectal Q6H  . divalproex  2,000 mg Oral BID  . enoxaparin (LOVENOX) injection  40 mg Subcutaneous Q24H  . levETIRAcetam  750 mg Oral BID  . pantoprazole  40 mg Oral BID  . phenytoin  50 mg Oral QHS  . phenytoin  100 mg Oral BID   Infusions:  PRN Meds: alum & mag hydroxide-simeth, [START ON 08/06/2020] famotidine   Allergies as of 08/04/2020 - Review Complete 08/04/2020  Allergen Reaction Noted  . Mobic [meloxicam] Nausea Only 08/04/2020    Family History  Problem Relation Age of Onset  . Diabetes Mother   . Hypertension Mother   . Colon cancer Neg Hx     Social History   Socioeconomic History  . Marital status: Single    Spouse name: Not on file  . Number of children: Not on file  . Years of education: Not on file  . Highest education level: Not on file  Occupational History  . Not on file  Tobacco Use  . Smoking status: Current Every Day Smoker    Packs/day: 0.25    Types: Cigarettes  . Smokeless tobacco: Never Used  Vaping Use  . Vaping Use: Never used  Substance and Sexual Activity  . Alcohol use: Yes    Comment: one 40 oz beer each day  . Drug use: Yes    Types: Cocaine    Comment: cocaine--weekly  per pt; last time couple days ago  . Sexual activity: Never  Other Topics Concern  . Not on file  Social History Narrative   Right handed   One story home   Negative caffeine   Social Determinants of Health   Financial Resource Strain: Not on file  Food Insecurity: Not on file  Transportation Needs: Not on file  Physical Activity: Not on file  Stress: Not on  file  Social Connections: Not on file  Intimate Partner Violence: Not on file    REVIEW OF SYSTEMS: Constitutional: No weakness, no fatigue ENT:  No nose bleeds Pulm: No difficulty breathing.  No cough. CV:  No palpitations, no LE edema.  See HPI. GU:  No hematuria, no frequency GI: See HPI. Heme: No unusual or excessive bleeding or bruising. Transfusions: None. Neuro:  No headaches, no peripheral tingling or numbness.  No syncope, no seizures. Derm:  No itching, no rash or sores.  Endocrine:  No sweats or chills.  No polyuria or dysuria Immunization: Vaccinated with [place] COVID-19 vaccination in March and April 2021 Travel:  None beyond local counties in last few months.    PHYSICAL EXAM: Vital signs in last 24 hours: Vitals:   08/05/20 0457 08/05/20 1025  BP: 132/86 140/88  Pulse:  60  Resp: 16 11  Temp: 98.6 F (37 C)   SpO2:     Wt Readings from Last 3 Encounters:  08/04/20 63.5 kg  05/02/20 67.6 kg  02/29/20 65.8 kg    General: Nonill appearing.  Disengaged, required frequent verbal cues to keep him focused. Head: No facial asymmetry or swelling.  No signs of head trauma. Eyes: No scleral icterus.  No conjunctival pallor.  EOMI. Ears: Slight HOH. Nose: No congestion or discharge. Mouth: Tongue midline.  Mucosa moist, pink, clear. Neck: No JVD, no masses, no thyromegaly Lungs: Clear breath sounds bilaterally.  No labored breathing or cough Heart: RRR.  Rate in the 60s.  S1, S2 present Abdomen: Soft.  Not tender, not distended.  No HSM, masses, bruits, hernias.  Bowel sounds active..   Rectal:  None Musc/Skeltl: No joint redness, swelling or gross deformity. Extremities: No CCE. Neurologic: Alert.  Oriented x3.  Tends to drift off into a "sleep" but able to verbally cue him to focus on conversation.  No asterixis, no tremors.  Moves all 4 limbs Skin: No rash, no sores, no telangiectasia Nodes: No cervical adenopathy Psych: Cooperative.  Calm.  Intake/Output from previous day: No intake/output data recorded. Intake/Output this shift: Total I/O In: 120 [P.O.:120] Out: -   LAB RESULTS: Recent Labs    08/04/20 0624 08/05/20 0229  WBC 8.4 8.7  HGB 15.1 14.1  HCT 47.1 42.1  PLT 234 199   BMET Lab Results  Component Value Date   NA 139 08/05/2020   NA 139 08/04/2020   NA 143 05/02/2020   K 3.9 08/05/2020   K 5.1 08/04/2020   K 4.4 05/02/2020   CL 106 08/05/2020   CL 107 08/04/2020   CL 108 05/02/2020   CO2 28 08/05/2020   CO2 20 (L) 08/04/2020   CO2 30 05/02/2020   GLUCOSE 85 08/05/2020   GLUCOSE 88 08/04/2020   GLUCOSE 75 05/02/2020   BUN 19 08/05/2020   BUN 21 08/04/2020   BUN 28 (H) 05/02/2020   CREATININE 1.09 08/05/2020   CREATININE 0.90 08/04/2020   CREATININE 1.07 05/02/2020   CALCIUM 8.9 08/05/2020   CALCIUM 8.5 (L) 08/04/2020   CALCIUM 9.5 05/02/2020   LFT Recent Labs    08/04/20 0801  PROT 7.1  ALBUMIN 3.6  AST 21  ALT 17  ALKPHOS 64  BILITOT 0.5  BILIDIR 0.2  IBILI 0.3   PT/INR Lab Results  Component Value Date   INR 1.1 02/26/2008   Hepatitis Panel No results for input(s): HEPBSAG, HCVAB, HEPAIGM, HEPBIGM in the last 72 hours. C-Diff No components found for: CDIFF Lipase  Component Value Date/Time   LIPASE 35 08/04/2020 0801    Drugs of Abuse     Component Value Date/Time   LABOPIA NONE DETECTED 01/23/2017 2346   COCAINSCRNUR POSITIVE (A) 01/23/2017 2346   COCAINSCRNUR NEGATIVE 11/24/2010 0515   LABBENZ NONE DETECTED 01/23/2017 2346   LABBENZ NEGATIVE 11/24/2010 0515   AMPHETMU NONE DETECTED 01/23/2017 2346    THCU NONE DETECTED 01/23/2017 2346   LABBARB NONE DETECTED 01/23/2017 2346     RADIOLOGY STUDIES: DG Chest 2 View  Result Date: 08/04/2020 CLINICAL DATA:  65 year old male with history of generalized chest pain. EXAM: CHEST - 2 VIEW COMPARISON:  Chest x-ray 01/23/2017. FINDINGS: Lung volumes are normal. No consolidative airspace disease. No pleural effusions. No pneumothorax. No pulmonary nodule or mass noted. Pulmonary vasculature and the cardiomediastinal silhouette are within normal limits. IMPRESSION: No radiographic evidence of acute cardiopulmonary disease. Electronically Signed   By: Vinnie Langton M.D.   On: 08/04/2020 06:53   ECHOCARDIOGRAM COMPLETE  Result Date: 08/05/2020    ECHOCARDIOGRAM REPORT   Patient Name:   Dontavian Willa Rough Date of Exam: 08/05/2020 Medical Rec #:  [id number]     Height:       65.0 in Accession #:    [id number]    Weight:       140.1 lb Date of Birth:  [birth year]     BSA:          1.700 m Patient Age:    39 years      BP:           140/88 mmHg Patient Gender: M             HR:           55 bpm. Exam Location:  Inpatient Procedure: 2D Echo, Cardiac Doppler and Color Doppler Indications:    Chest pain  History:        Patient has no prior history of Echocardiogram examinations.                 Stroke; Risk Factors:Diabetes.  Sonographer:    Luisa Hart RDCS Referring Phys: [id number] TODD D MCDIARMID  Sonographer Comments: No cardiac surgeries or procedures noted in chart IMPRESSIONS  1. Left ventricular ejection fraction, by estimation, is 60 to 65%. The left ventricle has normal function. The left ventricle has no regional wall motion abnormalities. There is moderate asymmetric left ventricular hypertrophy of the basal-septal segment. Left ventricular diastolic parameters are consistent with Grade I diastolic dysfunction (impaired relaxation).  2. Right ventricular systolic function is normal. The right ventricular size is normal. There is normal pulmonary artery systolic  pressure.  3. The mitral valve is normal in structure. No evidence of mitral valve regurgitation.  4. The aortic valve is tricuspid. Aortic valve regurgitation is not visualized.  5. The inferior vena cava is normal in size with greater than 50% respiratory variability, suggesting right atrial pressure of 3 mmHg. Comparison(s): No prior Echocardiogram. FINDINGS  Left Ventricle: Left ventricular ejection fraction, by estimation, is 60 to 65%. The left ventricle has normal function. The left ventricle has no regional wall motion abnormalities. The left ventricular internal cavity size was normal in size. There is  moderate asymmetric left ventricular hypertrophy of the basal-septal segment. Left ventricular diastolic parameters are consistent with Grade I diastolic dysfunction (impaired relaxation). Indeterminate filling pressures. Right Ventricle: The right ventricular size is normal. No increase in right ventricular wall thickness. Right ventricular systolic function is normal. There is normal pulmonary  artery systolic pressure. The tricuspid regurgitant velocity is 1.90 m/s, and  with an assumed right atrial pressure of 3 mmHg, the estimated right ventricular systolic pressure is 29.4 mmHg. Left Atrium: Left atrial size was normal in size. Right Atrium: Right atrial size was normal in size. Pericardium: There is no evidence of pericardial effusion. Mitral Valve: The mitral valve is normal in structure. No evidence of mitral valve regurgitation. Tricuspid Valve: The tricuspid valve is grossly normal. Tricuspid valve regurgitation is trivial. Aortic Valve: The aortic valve is tricuspid. Aortic valve regurgitation is not visualized. Aortic valve mean gradient measures 3.0 mmHg. Aortic valve peak gradient measures 5.1 mmHg. Aortic valve area, by VTI measures 2.42 cm. Pulmonic Valve: The pulmonic valve was normal in structure. Pulmonic valve regurgitation is not visualized. Aorta: The aortic root and ascending aorta  are structurally normal, with no evidence of dilitation. Venous: The inferior vena cava is normal in size with greater than 50% respiratory variability, suggesting right atrial pressure of 3 mmHg. IAS/Shunts: No atrial level shunt detected by color flow Doppler.  LEFT VENTRICLE PLAX 2D LVIDd:         4.00 cm     Diastology LVIDs:         2.15 cm     LV e' medial:    3.70 cm/s LV PW:         0.85 cm     LV E/e' medial:  15.5 LV IVS:        1.47 cm     LV e' lateral:   7.29 cm/s LVOT diam:     2.00 cm     LV E/e' lateral: 7.9 LV SV:         61 LV SV Index:   36 LVOT Area:     3.14 cm  LV Volumes (MOD) LV vol d, MOD A2C: 48.5 ml LV vol d, MOD A4C: 62.3 ml LV vol s, MOD A2C: 15.5 ml LV vol s, MOD A4C: 27.5 ml LV SV MOD A2C:     33.0 ml LV SV MOD A4C:     62.3 ml LV SV MOD BP:      35.2 ml RIGHT VENTRICLE RV S prime:     11.00 cm/s TAPSE (M-mode): 1.6 cm LEFT ATRIUM             Index       RIGHT ATRIUM          Index LA diam:        3.60 cm 2.12 cm/m  RA Area:     8.17 cm LA Vol (A2C):   44.0 ml 25.88 ml/m RA Volume:   12.10 ml 7.12 ml/m LA Vol (A4C):   25.6 ml 15.06 ml/m LA Biplane Vol: 35.2 ml 20.70 ml/m  AORTIC VALVE                   PULMONIC VALVE AV Area (Vmax):    2.27 cm    PV Vmax:       0.74 m/s AV Area (Vmean):   2.40 cm    PV Vmean:      51.800 cm/s AV Area (VTI):     2.42 cm    PV VTI:        0.156 m AV Vmax:           113.00 cm/s PV Peak grad:  2.2 mmHg AV Vmean:          77.700 cm/s PV Mean grad:  1.0  mmHg AV VTI:            0.252 m AV Peak Grad:      5.1 mmHg AV Mean Grad:      3.0 mmHg LVOT Vmax:         81.70 cm/s LVOT Vmean:        59.400 cm/s LVOT VTI:          0.194 m LVOT/AV VTI ratio: 0.77  AORTA Ao Root diam: 3.30 cm Ao Asc diam:  2.60 cm MITRAL VALVE               TRICUSPID VALVE MV Area (PHT): 3.85 cm    TR Peak grad:   14.4 mmHg MV Decel Time: 197 msec    TR Vmax:        190.00 cm/s MV E velocity: 57.40 cm/s MV A velocity: 90.80 cm/s  SHUNTS MV E/A ratio:  0.63        Systemic VTI:   0.19 m                            Systemic Diam: 2.00 cm Lyman Bishop MD Electronically signed by Lyman Bishop MD Signature Date/Time: 08/05/2020/12:33:14 PM    Final    CT Angio Chest/Abd/Pel for Dissection W and/or Wo Contrast  Result Date: 08/04/2020 CLINICAL DATA:  65 year old male with chest pain. EXAM: CT ANGIOGRAPHY CHEST, ABDOMEN AND PELVIS TECHNIQUE: Non-contrast CT of the chest was initially obtained. Multidetector CT imaging through the chest, abdomen and pelvis was performed using the standard protocol during bolus administration of intravenous contrast. Multiplanar reconstructed images and MIPs were obtained and reviewed to evaluate the vascular anatomy. CONTRAST:  159mL OMNIPAQUE IOHEXOL 350 MG/ML SOLN COMPARISON:  CT abdomen pelvis from 09/20/2019 FINDINGS: CTA CHEST FINDINGS VASCULAR Preferential opacification of the thoracic aorta. No evidence of thoracic aortic aneurysm or dissection. Normal heart size. No pericardial effusion. Sinues of Valsalva: 26 mm 26 x 29 mm Sinotubular Junction: 26 mm Ascending Aorta: 30 mm Aortic Arch: 28 mm Descending aorta: 24 mm at the level of the carina Branch vessels: Conventional branching pattern. No significant atherosclerotic changes. Coronary arteries: Normal origins and courses. Severe focal atherosclerotic calcification straddling the bifurcation of the left main coronary artery, extending into the left anterior descending and left circumflex coronary arteries. Main pulmonary artery: 26 mm. No evidence of central pulmonary embolism. Pulmonary veins: No anomalous pulmonary venous return. No evidence of left atrial appendage thrombus. Review of the MIP images confirms the above findings. NON VASCULAR Mediastinum/Nodes: No enlarged mediastinal, hilar, or axillary lymph nodes. Thyroid gland, trachea, and esophagus demonstrate no significant findings. Lungs/Pleura: Lungs are clear. No pleural effusion or pneumothorax. Musculoskeletal: No chest wall abnormality.  No acute or significant osseous findings. CTA ABDOMEN AND PELVIS FINDINGS VASCULAR Aorta: Patent and normal caliber throughout. Scattered fibrofatty and calcified atherosclerotic changes, most prominent in the distal aorta. Celiac: Patent without evidence of aneurysm, dissection, vasculitis or significant stenosis. SMA: Patent without evidence of aneurysm, dissection, vasculitis or significant stenosis. Renals: Single bilateral renal arteries are patent without evidence of aneurysm, dissection, vasculitis, fibromuscular dysplasia or significant stenosis. IMA: Patent without evidence of aneurysm, dissection, vasculitis or significant stenosis. Inflow: Circumferential atherosclerotic calcifications. Patent without evidence of aneurysm, dissection, vasculitis or significant stenosis. Veins: No obvious venous abnormality within the limitations of this arterial phase study. Review of the MIP images confirms the above findings. NON-VASCULAR Hepatobiliary: 8 mm hyperattenuating focus in the medial aspect  of segment 2, mildly hypoattenuating on noncontrast phase. No additional focal hepatic abnormality. The gallbladder is present with multiple peripherally calcified infundibular gallstones. No pericholecystic fluid or gallbladder wall thickening. No intra or extrahepatic biliary ductal dilation. Pancreas: Unremarkable. No pancreatic ductal dilatation or surrounding inflammatory changes. Spleen: Normal in size without focal abnormality. Adrenals/Urinary Tract: Adrenal glands are unremarkable. Kidneys are normal, without renal calculi, focal lesion, or hydronephrosis. Bladder is unremarkable. Stomach/Bowel: Stomach is within normal limits. Appendix appears normal. Scattered colonic diverticula, most pronounced in the descending colon without surrounding inflammatory changes. No evidence of bowel wall thickening, distention, or inflammatory changes. Lymphatic: No abdominopelvic lymphadenopathy. Reproductive: Prostate is  unremarkable. Other: No abdominal wall hernia or abnormality. No abdominopelvic ascites. Musculoskeletal: No acute or significant osseous findings. IMPRESSION: VASCULAR 1. No evidence of acute aortic syndrome. 2. Severe proximal left anterior descending and left circumflex coronary arterial atherosclerotic calcifications. 3.  Aortic Atherosclerosis (ICD10-I70.0). NON VASCULAR 1. Hyperattenuating subcentimeter focus in the left lobe of the liver, most compatible flash filling hepatic hemangioma. 2. Diverticulosis, no evidence of diverticulitis. Ruthann Cancer, MD [facility] Electronically Signed   By: Ruthann Cancer MD   On: 08/04/2020 10:01      IMPRESSION:   *    Mid abdominal pain and chest pain. CT shows flash filling hepatic hemangioma.  Diverticulosis, gallstones. LFTs normal.  *   Chest pain.  EKG and trops non-worrisome.  However calcified LAD and L cfx on chest CT.    *    cocaine abuse, smoked cocaine a week ago and on Friday 4/8.  *    EtOH abuse.  Admits to drinking 40 to 80 ounces of beer daily.  *   History CVA.  Not on any CVA prophylaxis meds.  *   Hypertension, improved with meds.   PLAN:     *   EGD, tmrw?   Placing orders in the Endo unit [place] but Dr. Hilarie Fredrickson will make the decision regarding proceeding forward with EGD.   Azucena Freed  08/05/2020, 12:46 PM Phone [phone]

## 2020-08-05 NOTE — Consult Note (Signed)
[facility] Consult: 12:46 PM 08/05/2020  LOS: 0 days    Referring Provider: Dr McDiarmid.   Primary Care Physician:  Burman Riis, NP Primary Gastroenterologist:  Dr. Loletha Carrow   Reason for Consultation: Epigastric pain.   HPI: [name] is a 65 y.o. male.  PMH CVA, not on platelet altering or anticoagulation meds..  Lung abscess.  Seizures.  DM, not on any meds.  Left eye blindness.  Substance abuse including alcohol, cocaine.  04/2017 colonoscopy.  Routine screening.  Left and right colon diverticulosis.  Otherwise normal study.  Suggest follow-up screening in 10 years.  Patient smoked cocaine a week ago Monday.  On Wednesday he mild chest and abdominal discomfort.  They seem to be 2 separate entities 1 is pain across the mid chest bilaterally that is not exertional.  The other pain is across the mid abdomen at about the level of the umbilicus.  Neither of these pains is exacerbated by food.  Accompanying this was nausea but no emesis.  Was taking baking soda mixed with water hoping that would help, its effect was equivocal.  Over this course of time he was still having daily brown stools.  He smoked cocaine again on Friday and says that it made him feel better.  Did okay on Saturday's though the vague pain persisted.  He was having no trouble breathing.  On Sunday the pain got very severe and he came to the emergency room.  No previous similar pain.  Does not tend to suffer from heartburn or GI upset.  No gastric acid controlling agents at home. At presentation to ED triage 6 AM yesterday he was hypertensive at 178/98.  One reading was as high as 168/120.  No tachycardia.  No hypoxia, no fever.  EKG unremarkable. Treated with aspirin.  2V CXR: Unremarkable. CT angio of chest/abdomen/pelvis: 8 mm  hyperattenuating foci and medial aspect of segment 2, most compatible with flash filling hepatic hemangioma.  Uncomplicated diverticulosis.  Uncomplicated gallstones.  Vascular findings included severe proximal left anterior descending and left circumflex coronary arterial atherosclerotic calcifications and aortic atherosclerosis.  Patent celiac, SMA, IMA.  High-sensitivity troponins negative. LFTs, renal function, lipase normal. Hgb 14.1.  Normal WBCs and platelets. No UA.  Treatments thus far include doses of antacids, viscous lidocaine, morphine oral famotidine started this morning.  He has received 2 doses of Protonix 40 mg po.     Smokes cocaine.  Admits to drinking 40 to 80 ounces of beer daily.  Past Medical History:  Diagnosis Date  . Atherosclerosis of aorta ([place]) 08/05/2020  . Blindness of right eye   . Diabetes mellitus   . Epilepsy ([place])    As reported by patient  . LUNG ABSCESS 03/14/2008   Qualifier: Diagnosis of  By: Melvyn Novas MD, Christena Deem   . Multiple gallstones 08/05/2020   CTA 08/04/20: multiple peripherally calcified infundibular gallstones  . Overdose 01/24/2017  . Sciatica of right side 04/30/2016  . Seizures ([place])    since age 81;last seizure 3-4 years ago per pt  . Stroke (  [place]) 2 years ago    Past Surgical History:  Procedure Laterality Date  . NO PAST SURGERIES      Prior to Admission medications   Medication Sig Start Date End Date Taking? Authorizing Provider  acetaminophen (TYLENOL) 325 MG tablet Take 2 tablets (650 mg total) by mouth every 6 (six) hours as needed for mild pain. 05/02/16  Yes Arrien, Jimmy Picket, MD  Cholecalciferol (VITAMIN D) 50 MCG (2000 UT) tablet Take 2,000 Units by mouth daily. 07/24/20  Yes [provider]  cholecalciferol (VITAMIN D3) 25 MCG (1000 UNIT) tablet Take 1,000 Units by mouth daily.   Yes [provider]  divalproex (DEPAKOTE ER) 500 MG 24 hr tablet Take 4 tablets twice daily 05/02/20  Yes Jaffe, Adam R, DO   famotidine (PEPCID) 20 MG tablet Take 1 tablet (20 mg total) by mouth 2 (two) times daily. 08/04/20  Yes Carlisle Cater, PA-C  levETIRAcetam (KEPPRA) 750 MG tablet Take 1 tablet (750 mg total) by mouth 2 (two) times daily. 05/02/20  Yes Tomi Likens, Adam R, DO  omeprazole (PRILOSEC) 20 MG capsule Take 1 capsule (20 mg total) by mouth daily. 08/04/20  Yes Carlisle Cater, PA-C  phenytoin (DILANTIN) 100 MG ER capsule Take 2 capsules (200 mg total) by mouth in the morning. Patient taking differently: Take 200 mg by mouth 2 (two) times daily. 05/02/20  Yes Tomi Likens, Adam R, DO  phenytoin (PHENYTOIN INFATABS) 50 MG tablet CHEW 1 TABLET (50 MG TOTAL) BY MOUTH AT BEDTIME. 05/02/20  Yes Jaffe, Adam R, DO    Scheduled Meds: . acetaminophen  650 mg Oral Q6H   Or  . acetaminophen  650 mg Rectal Q6H  . divalproex  2,000 mg Oral BID  . enoxaparin (LOVENOX) injection  40 mg Subcutaneous Q24H  . levETIRAcetam  750 mg Oral BID  . pantoprazole  40 mg Oral BID  . phenytoin  50 mg Oral QHS  . phenytoin  100 mg Oral BID   Infusions:  PRN Meds: alum & mag hydroxide-simeth, [START ON 08/06/2020] famotidine   Allergies as of 08/04/2020 - Review Complete 08/04/2020  Allergen Reaction Noted  . Mobic [meloxicam] Nausea Only 08/04/2020    Family History  Problem Relation Age of Onset  . Diabetes Mother   . Hypertension Mother   . Colon cancer Neg Hx     Social History   Socioeconomic History  . Marital status: Single    Spouse name: Not on file  . Number of children: Not on file  . Years of education: Not on file  . Highest education level: Not on file  Occupational History  . Not on file  Tobacco Use  . Smoking status: Current Every Day Smoker    Packs/day: 0.25    Types: Cigarettes  . Smokeless tobacco: Never Used  Vaping Use  . Vaping Use: Never used  Substance and Sexual Activity  . Alcohol use: Yes    Comment: one 40 oz beer each day  . Drug use: Yes    Types: Cocaine    Comment: cocaine--weekly  per pt; last time couple days ago  . Sexual activity: Never  Other Topics Concern  . Not on file  Social History Narrative   Right handed   One story home   Negative caffeine   Social Determinants of Health   Financial Resource Strain: Not on file  Food Insecurity: Not on file  Transportation Needs: Not on file  Physical Activity: Not on file  Stress: Not on  file  Social Connections: Not on file  Intimate Partner Violence: Not on file    REVIEW OF SYSTEMS: Constitutional: No weakness, no fatigue ENT:  No nose bleeds Pulm: No difficulty breathing.  No cough. CV:  No palpitations, no LE edema.  See HPI. GU:  No hematuria, no frequency GI: See HPI. Heme: No unusual or excessive bleeding or bruising. Transfusions: None. Neuro:  No headaches, no peripheral tingling or numbness.  No syncope, no seizures. Derm:  No itching, no rash or sores.  Endocrine:  No sweats or chills.  No polyuria or dysuria Immunization: Vaccinated with [place] COVID-19 vaccination in March and April 2021 Travel:  None beyond local counties in last few months.    PHYSICAL EXAM: Vital signs in last 24 hours: Vitals:   08/05/20 0457 08/05/20 1025  BP: 132/86 140/88  Pulse:  60  Resp: 16 11  Temp: 98.6 F (37 C)   SpO2:     Wt Readings from Last 3 Encounters:  08/04/20 63.5 kg  05/02/20 67.6 kg  02/29/20 65.8 kg    General: Nonill appearing.  Disengaged, required frequent verbal cues to keep him focused. Head: No facial asymmetry or swelling.  No signs of head trauma. Eyes: No scleral icterus.  No conjunctival pallor.  EOMI. Ears: Slight HOH. Nose: No congestion or discharge. Mouth: Tongue midline.  Mucosa moist, pink, clear. Neck: No JVD, no masses, no thyromegaly Lungs: Clear breath sounds bilaterally.  No labored breathing or cough Heart: RRR.  Rate in the 60s.  S1, S2 present Abdomen: Soft.  Not tender, not distended.  No HSM, masses, bruits, hernias.  Bowel sounds active..   Rectal:  None Musc/Skeltl: No joint redness, swelling or gross deformity. Extremities: No CCE. Neurologic: Alert.  Oriented x3.  Tends to drift off into a "sleep" but able to verbally cue him to focus on conversation.  No asterixis, no tremors.  Moves all 4 limbs Skin: No rash, no sores, no telangiectasia Nodes: No cervical adenopathy Psych: Cooperative.  Calm.  Intake/Output from previous day: No intake/output data recorded. Intake/Output this shift: Total I/O In: 120 [P.O.:120] Out: -   LAB RESULTS: Recent Labs    08/04/20 0624 08/05/20 0229  WBC 8.4 8.7  HGB 15.1 14.1  HCT 47.1 42.1  PLT 234 199   BMET Lab Results  Component Value Date   NA 139 08/05/2020   NA 139 08/04/2020   NA 143 05/02/2020   K 3.9 08/05/2020   K 5.1 08/04/2020   K 4.4 05/02/2020   CL 106 08/05/2020   CL 107 08/04/2020   CL 108 05/02/2020   CO2 28 08/05/2020   CO2 20 (L) 08/04/2020   CO2 30 05/02/2020   GLUCOSE 85 08/05/2020   GLUCOSE 88 08/04/2020   GLUCOSE 75 05/02/2020   BUN 19 08/05/2020   BUN 21 08/04/2020   BUN 28 (H) 05/02/2020   CREATININE 1.09 08/05/2020   CREATININE 0.90 08/04/2020   CREATININE 1.07 05/02/2020   CALCIUM 8.9 08/05/2020   CALCIUM 8.5 (L) 08/04/2020   CALCIUM 9.5 05/02/2020   LFT Recent Labs    08/04/20 0801  PROT 7.1  ALBUMIN 3.6  AST 21  ALT 17  ALKPHOS 64  BILITOT 0.5  BILIDIR 0.2  IBILI 0.3   PT/INR Lab Results  Component Value Date   INR 1.1 02/26/2008   Hepatitis Panel No results for input(s): HEPBSAG, HCVAB, HEPAIGM, HEPBIGM in the last 72 hours. C-Diff No components found for: CDIFF Lipase  Component Value Date/Time   LIPASE 35 08/04/2020 0801    Drugs of Abuse     Component Value Date/Time   LABOPIA NONE DETECTED 01/23/2017 2346   COCAINSCRNUR POSITIVE (A) 01/23/2017 2346   COCAINSCRNUR NEGATIVE 11/24/2010 0515   LABBENZ NONE DETECTED 01/23/2017 2346   LABBENZ NEGATIVE 11/24/2010 0515   AMPHETMU NONE DETECTED 01/23/2017 2346    THCU NONE DETECTED 01/23/2017 2346   LABBARB NONE DETECTED 01/23/2017 2346     RADIOLOGY STUDIES: DG Chest 2 View  Result Date: 08/04/2020 CLINICAL DATA:  65 year old male with history of generalized chest pain. EXAM: CHEST - 2 VIEW COMPARISON:  Chest x-ray 01/23/2017. FINDINGS: Lung volumes are normal. No consolidative airspace disease. No pleural effusions. No pneumothorax. No pulmonary nodule or mass noted. Pulmonary vasculature and the cardiomediastinal silhouette are within normal limits. IMPRESSION: No radiographic evidence of acute cardiopulmonary disease. Electronically Signed   By: Vinnie Langton M.D.   On: 08/04/2020 06:53   ECHOCARDIOGRAM COMPLETE  Result Date: 08/05/2020    ECHOCARDIOGRAM REPORT   Patient Name:   Josephine Willa Rough Date of Exam: 08/05/2020 Medical Rec #:  [id number]     Height:       65.0 in Accession #:    [id number]    Weight:       140.1 lb Date of Birth:  02-02-1956     BSA:          1.700 m Patient Age:    42 years      BP:           140/88 mmHg Patient Gender: M             HR:           55 bpm. Exam Location:  Inpatient Procedure: 2D Echo, Cardiac Doppler and Color Doppler Indications:    Chest pain  History:        Patient has no prior history of Echocardiogram examinations.                 Stroke; Risk Factors:Diabetes.  Sonographer:    Luisa Hart RDCS Referring Phys: [id number] TODD D MCDIARMID  Sonographer Comments: No cardiac surgeries or procedures noted in chart IMPRESSIONS  1. Left ventricular ejection fraction, by estimation, is 60 to 65%. The left ventricle has normal function. The left ventricle has no regional wall motion abnormalities. There is moderate asymmetric left ventricular hypertrophy of the basal-septal segment. Left ventricular diastolic parameters are consistent with Grade I diastolic dysfunction (impaired relaxation).  2. Right ventricular systolic function is normal. The right ventricular size is normal. There is normal pulmonary artery systolic  pressure.  3. The mitral valve is normal in structure. No evidence of mitral valve regurgitation.  4. The aortic valve is tricuspid. Aortic valve regurgitation is not visualized.  5. The inferior vena cava is normal in size with greater than 50% respiratory variability, suggesting right atrial pressure of 3 mmHg. Comparison(s): No prior Echocardiogram. FINDINGS  Left Ventricle: Left ventricular ejection fraction, by estimation, is 60 to 65%. The left ventricle has normal function. The left ventricle has no regional wall motion abnormalities. The left ventricular internal cavity size was normal in size. There is  moderate asymmetric left ventricular hypertrophy of the basal-septal segment. Left ventricular diastolic parameters are consistent with Grade I diastolic dysfunction (impaired relaxation). Indeterminate filling pressures. Right Ventricle: The right ventricular size is normal. No increase in right ventricular wall thickness. Right ventricular systolic function is normal. There is normal pulmonary  artery systolic pressure. The tricuspid regurgitant velocity is 1.90 m/s, and  with an assumed right atrial pressure of 3 mmHg, the estimated right ventricular systolic pressure is 81.1 mmHg. Left Atrium: Left atrial size was normal in size. Right Atrium: Right atrial size was normal in size. Pericardium: There is no evidence of pericardial effusion. Mitral Valve: The mitral valve is normal in structure. No evidence of mitral valve regurgitation. Tricuspid Valve: The tricuspid valve is grossly normal. Tricuspid valve regurgitation is trivial. Aortic Valve: The aortic valve is tricuspid. Aortic valve regurgitation is not visualized. Aortic valve mean gradient measures 3.0 mmHg. Aortic valve peak gradient measures 5.1 mmHg. Aortic valve area, by VTI measures 2.42 cm. Pulmonic Valve: The pulmonic valve was normal in structure. Pulmonic valve regurgitation is not visualized. Aorta: The aortic root and ascending aorta  are structurally normal, with no evidence of dilitation. Venous: The inferior vena cava is normal in size with greater than 50% respiratory variability, suggesting right atrial pressure of 3 mmHg. IAS/Shunts: No atrial level shunt detected by color flow Doppler.  LEFT VENTRICLE PLAX 2D LVIDd:         4.00 cm     Diastology LVIDs:         2.15 cm     LV e' medial:    3.70 cm/s LV PW:         0.85 cm     LV E/e' medial:  15.5 LV IVS:        1.47 cm     LV e' lateral:   7.29 cm/s LVOT diam:     2.00 cm     LV E/e' lateral: 7.9 LV SV:         61 LV SV Index:   36 LVOT Area:     3.14 cm  LV Volumes (MOD) LV vol d, MOD A2C: 48.5 ml LV vol d, MOD A4C: 62.3 ml LV vol s, MOD A2C: 15.5 ml LV vol s, MOD A4C: 27.5 ml LV SV MOD A2C:     33.0 ml LV SV MOD A4C:     62.3 ml LV SV MOD BP:      35.2 ml RIGHT VENTRICLE RV S prime:     11.00 cm/s TAPSE (M-mode): 1.6 cm LEFT ATRIUM             Index       RIGHT ATRIUM          Index LA diam:        3.60 cm 2.12 cm/m  RA Area:     8.17 cm LA Vol (A2C):   44.0 ml 25.88 ml/m RA Volume:   12.10 ml 7.12 ml/m LA Vol (A4C):   25.6 ml 15.06 ml/m LA Biplane Vol: 35.2 ml 20.70 ml/m  AORTIC VALVE                   PULMONIC VALVE AV Area (Vmax):    2.27 cm    PV Vmax:       0.74 m/s AV Area (Vmean):   2.40 cm    PV Vmean:      51.800 cm/s AV Area (VTI):     2.42 cm    PV VTI:        0.156 m AV Vmax:           113.00 cm/s PV Peak grad:  2.2 mmHg AV Vmean:          77.700 cm/s PV Mean grad:  1.0  mmHg AV VTI:            0.252 m AV Peak Grad:      5.1 mmHg AV Mean Grad:      3.0 mmHg LVOT Vmax:         81.70 cm/s LVOT Vmean:        59.400 cm/s LVOT VTI:          0.194 m LVOT/AV VTI ratio: 0.77  AORTA Ao Root diam: 3.30 cm Ao Asc diam:  2.60 cm MITRAL VALVE               TRICUSPID VALVE MV Area (PHT): 3.85 cm    TR Peak grad:   14.4 mmHg MV Decel Time: 197 msec    TR Vmax:        190.00 cm/s MV E velocity: 57.40 cm/s MV A velocity: 90.80 cm/s  SHUNTS MV E/A ratio:  0.63        Systemic VTI:   0.19 m                            Systemic Diam: 2.00 cm Lyman Bishop MD Electronically signed by Lyman Bishop MD Signature Date/Time: 08/05/2020/12:33:14 PM    Final    CT Angio Chest/Abd/Pel for Dissection W and/or Wo Contrast  Result Date: 08/04/2020 CLINICAL DATA:  65 year old male with chest pain. EXAM: CT ANGIOGRAPHY CHEST, ABDOMEN AND PELVIS TECHNIQUE: Non-contrast CT of the chest was initially obtained. Multidetector CT imaging through the chest, abdomen and pelvis was performed using the standard protocol during bolus administration of intravenous contrast. Multiplanar reconstructed images and MIPs were obtained and reviewed to evaluate the vascular anatomy. CONTRAST:  190mL OMNIPAQUE IOHEXOL 350 MG/ML SOLN COMPARISON:  CT abdomen pelvis from 09/20/2019 FINDINGS: CTA CHEST FINDINGS VASCULAR Preferential opacification of the thoracic aorta. No evidence of thoracic aortic aneurysm or dissection. Normal heart size. No pericardial effusion. Sinues of Valsalva: 26 mm 26 x 29 mm Sinotubular Junction: 26 mm Ascending Aorta: 30 mm Aortic Arch: 28 mm Descending aorta: 24 mm at the level of the carina Branch vessels: Conventional branching pattern. No significant atherosclerotic changes. Coronary arteries: Normal origins and courses. Severe focal atherosclerotic calcification straddling the bifurcation of the left main coronary artery, extending into the left anterior descending and left circumflex coronary arteries. Main pulmonary artery: 26 mm. No evidence of central pulmonary embolism. Pulmonary veins: No anomalous pulmonary venous return. No evidence of left atrial appendage thrombus. Review of the MIP images confirms the above findings. NON VASCULAR Mediastinum/Nodes: No enlarged mediastinal, hilar, or axillary lymph nodes. Thyroid gland, trachea, and esophagus demonstrate no significant findings. Lungs/Pleura: Lungs are clear. No pleural effusion or pneumothorax. Musculoskeletal: No chest wall abnormality.  No acute or significant osseous findings. CTA ABDOMEN AND PELVIS FINDINGS VASCULAR Aorta: Patent and normal caliber throughout. Scattered fibrofatty and calcified atherosclerotic changes, most prominent in the distal aorta. Celiac: Patent without evidence of aneurysm, dissection, vasculitis or significant stenosis. SMA: Patent without evidence of aneurysm, dissection, vasculitis or significant stenosis. Renals: Single bilateral renal arteries are patent without evidence of aneurysm, dissection, vasculitis, fibromuscular dysplasia or significant stenosis. IMA: Patent without evidence of aneurysm, dissection, vasculitis or significant stenosis. Inflow: Circumferential atherosclerotic calcifications. Patent without evidence of aneurysm, dissection, vasculitis or significant stenosis. Veins: No obvious venous abnormality within the limitations of this arterial phase study. Review of the MIP images confirms the above findings. NON-VASCULAR Hepatobiliary: 8 mm hyperattenuating focus in the medial aspect  of segment 2, mildly hypoattenuating on noncontrast phase. No additional focal hepatic abnormality. The gallbladder is present with multiple peripherally calcified infundibular gallstones. No pericholecystic fluid or gallbladder wall thickening. No intra or extrahepatic biliary ductal dilation. Pancreas: Unremarkable. No pancreatic ductal dilatation or surrounding inflammatory changes. Spleen: Normal in size without focal abnormality. Adrenals/Urinary Tract: Adrenal glands are unremarkable. Kidneys are normal, without renal calculi, focal lesion, or hydronephrosis. Bladder is unremarkable. Stomach/Bowel: Stomach is within normal limits. Appendix appears normal. Scattered colonic diverticula, most pronounced in the descending colon without surrounding inflammatory changes. No evidence of bowel wall thickening, distention, or inflammatory changes. Lymphatic: No abdominopelvic lymphadenopathy. Reproductive: Prostate is  unremarkable. Other: No abdominal wall hernia or abnormality. No abdominopelvic ascites. Musculoskeletal: No acute or significant osseous findings. IMPRESSION: VASCULAR 1. No evidence of acute aortic syndrome. 2. Severe proximal left anterior descending and left circumflex coronary arterial atherosclerotic calcifications. 3.  Aortic Atherosclerosis (ICD10-I70.0). NON VASCULAR 1. Hyperattenuating subcentimeter focus in the left lobe of the liver, most compatible flash filling hepatic hemangioma. 2. Diverticulosis, no evidence of diverticulitis. Ruthann Cancer, MD [facility] Electronically Signed   By: Ruthann Cancer MD   On: 08/04/2020 10:01      IMPRESSION:   *    Mid abdominal pain and chest pain. CT shows flash filling hepatic hemangioma.  Diverticulosis, gallstones. LFTs normal.  *   Chest pain.  EKG and trops non-worrisome.  However calcified LAD and L cfx on chest CT.    *    cocaine abuse, smoked cocaine a week ago and on Friday 4/8.  *    EtOH abuse.  Admits to drinking 40 to 80 ounces of beer daily.  *   History CVA.  Not on any CVA prophylaxis meds.  *   Hypertension, improved with meds.   PLAN:     *   EGD, tmrw?   Placing orders in the Endo unit [place] but Dr. Hilarie Fredrickson will make the decision regarding proceeding forward with EGD.   Azucena Freed  08/05/2020, 12:46 PM Phone [phone]

## 2020-08-05 NOTE — Consult Note (Addendum)
Cardiology Consultation:   Patient ID: [name] MRN: [id number]; DOB: 1955/06/08  Admit date: 08/04/2020 Date of Consult: 08/05/2020  PCP:  Burman Riis, NP   [place]  Cardiologist: New to Dr. Julieanne Manson Advanced Practice Provider:  No care team member to display Electrophysiologist:  None   Patient Profile:   [name] is a 65 y.o. male with a hx of remote stroke, blindness of right eye, epilepsy since age 55, lung abscess, alcohol/tobacco/cocaine abuse who is being seen today for the evaluation of chest pain at the request of Dr. Hilarie Fredrickson.  History of Present Illness:   [name] denies any cardiac history. He frequently expresses exasperation during the interview towards having to repeat his history and tells me I can find it in the chart. He had a remote stroke but does not recall any of the details. He drinks alcohol regularly - per chart, 40-80oz daily. He also has used cocaine regularly for years. He volunteers at [employer] and states he's very active when he does this without any angina or dyspnea. He last did so about a week ago. On Wednesday 4/6 he developed generalized abdominal discomfort from the epigastrum down. Around that time he also had developed occasional pains in his chest as the pain would "move" from one spot to the other. It would move to the left, middle, and right side of his chest, then all around his abdomen and to his back. No associated diaphoresis or SOB. It would last minutes at a time initially. When I asked if these felt like a connected discomfort or two separate pains, he stated, "I already told you, it was the same, it just moved all over." He frequently redirects the location of his pain down to his abdomen. He states each of these types of pains would get worse if he shifted a certain position, took a deep breath, pressed on the area or stood up. It also gets worse if "someone comes in here and starts  talking about it and makes me think about it." It would improve if he stayed quiet and still or drank warm liquid. However, yesterday, it did not improve when he drank hot chocolate. He also began to notice solid foods feeling like they were getting stuck. At that point he was having predominantly severe abdominal pain so came to the ED. He has also used cocaine intermittently through the last few days. He was hypertensive on arrival. CXR NAD. CT angio showed no evidence of acute aortic syndrome, + severe prox LAD and LCx coronary calcifications, aortic atherosclerosis, hypoattenuating subcentimeter focus in the left lobe of the liver, most compatible flash filling hepatic hemangioma, diverticulosis. Troponins negative. CBC normal. EKG NSR without ischemic changes and telemetry is benign. His BP has improved somewhat without specific intervention from an antihypertensive perspective but still above goal (last BP 140/88). 2d echo today showed normal EF, no RWMA, asymmetric LVH of basal-septal segment, grade 1 DD, no significant valve disease. He is awaiting EGD tomorrow. We are called to evaluate chest pain and coronary calcification. He says someone is lying to him because he says someone came in his room telling him he had something wrong with his heart valves and that it could cause death. He denies any acute complaints currently.   Past Medical History:  Diagnosis Date   Atherosclerosis of aorta ([place]) 08/05/2020   Blindness of right eye    Diabetes mellitus    Epilepsy ([place])  As reported by patient   LUNG ABSCESS 03/14/2008   Qualifier: Diagnosis of  By: Melvyn Novas MD, Legrand Como B    LVH (left ventricular hypertrophy) 08/05/2020   transthoracic echocardiogram 08/05/20: moderate asymmetric left ventricular hypertrophy of the basal-septal segment. Left ventricular diastolic parameters are consistent with Grade I diastolic dysfunction    Multiple gallstones 08/05/2020   CTA 08/04/20: multiple peripherally  calcified infundibular gallstones   Overdose 01/24/2017   Sciatica of right side 04/30/2016   Seizures ([place])    since age 58;last seizure 3-4 years ago per pt   Stroke ([place]) 2 years ago    Past Surgical History:  Procedure Laterality Date   NO PAST SURGERIES       Home Medications:  Prior to Admission medications   Medication Sig Start Date End Date Taking? Authorizing Provider  acetaminophen (TYLENOL) 325 MG tablet Take 2 tablets (650 mg total) by mouth every 6 (six) hours as needed for mild pain. 05/02/16  Yes Arrien, Jimmy Picket, MD  Cholecalciferol (VITAMIN D) 50 MCG (2000 UT) tablet Take 2,000 Units by mouth daily. 07/24/20  Yes [provider]  cholecalciferol (VITAMIN D3) 25 MCG (1000 UNIT) tablet Take 1,000 Units by mouth daily.   Yes [provider]  divalproex (DEPAKOTE ER) 500 MG 24 hr tablet Take 4 tablets twice daily 05/02/20  Yes Jaffe, Adam R, DO  famotidine (PEPCID) 20 MG tablet Take 1 tablet (20 mg total) by mouth 2 (two) times daily. 08/04/20  Yes Carlisle Cater, PA-C  levETIRAcetam (KEPPRA) 750 MG tablet Take 1 tablet (750 mg total) by mouth 2 (two) times daily. 05/02/20  Yes Tomi Likens, Adam R, DO  omeprazole (PRILOSEC) 20 MG capsule Take 1 capsule (20 mg total) by mouth daily. 08/04/20  Yes Carlisle Cater, PA-C  phenytoin (DILANTIN) 100 MG ER capsule Take 2 capsules (200 mg total) by mouth in the morning. Patient taking differently: Take 200 mg by mouth 2 (two) times daily. 05/02/20  Yes Tomi Likens, Adam R, DO  phenytoin (PHENYTOIN INFATABS) 50 MG tablet CHEW 1 TABLET (50 MG TOTAL) BY MOUTH AT BEDTIME. 05/02/20  Yes Pieter Partridge, DO    Inpatient Medications: Scheduled Meds:  acetaminophen  650 mg Oral Q6H   Or   acetaminophen  650 mg Rectal Q6H   aluminum hydroxide-magnesium carbonate  30 mL Oral TID PC & HS   divalproex  2,000 mg Oral BID   enoxaparin (LOVENOX) injection  40 mg Subcutaneous Q24H   levETIRAcetam  750 mg Oral BID   pantoprazole  40 mg Oral BID    phenytoin  50 mg Oral QHS   phenytoin  100 mg Oral BID   Continuous Infusions:   PRN Meds: [START ON 08/06/2020] famotidine  Allergies:    Allergies  Allergen Reactions   Mobic [Meloxicam] Nausea Only    "pain all over"    Social History:   Social History   Socioeconomic History   Marital status: Single    Spouse name: Not on file   Number of children: Not on file   Years of education: Not on file   Highest education level: Not on file  Occupational History   Not on file  Tobacco Use   Smoking status: Current Every Day Smoker    Packs/day: 0.25    Types: Cigarettes   Smokeless tobacco: Never Used  Vaping Use   Vaping Use: Never used  Substance and Sexual Activity   Alcohol use: Yes    Comment: one 40 oz beer  each day   Drug use: Yes    Types: Cocaine    Comment: cocaine--weekly per pt; last time couple days ago   Sexual activity: Never  Other Topics Concern   Not on file  Social History Narrative   Right handed   One story home   Negative caffeine   Social Determinants of Health   Financial Resource Strain: Not on file  Food Insecurity: Not on file  Transportation Needs: Not on file  Physical Activity: Not on file  Stress: Not on file  Social Connections: Not on file  Intimate Partner Violence: Not on file    Family History:    Family History  Problem Relation Age of Onset   Diabetes Mother    Hypertension Mother    Colon cancer Neg Hx      ROS:  Please see the history of present illness.  All other ROS reviewed and negative.     Physical Exam/Data:   Vitals:   08/04/20 1837 08/04/20 2100 08/05/20 0457 08/05/20 1025  BP: (!) 157/103 130/80 132/86 140/88  Pulse: 76 80  60  Resp:  16 16 11   Temp:   98.6 F (37 C)   TempSrc:   Oral   SpO2: 96% 96%    Weight:      Height:        Intake/Output Summary (Last 24 hours) at 08/05/2020 1708 Last data filed at 08/05/2020 1520 Gross per 24 hour  Intake 360 ml  Output 150 ml  Net 210 ml    Last 3 Weights 08/04/2020 08/04/2020 05/02/2020  Weight (lbs) 140 lb 1.6 oz 149 lb 0.5 oz 149 lb  Weight (kg) 63.549 kg 67.6 kg 67.586 kg     Body mass index is 23.31 kg/m.  General: Well developed AAM in no acute distress. Head: Normocephalic, atraumatic, no xanthomas, nares are without discharge. Baseline right eye ptosis. Neck: Negative for carotid bruits. JVP not elevated. Lungs: Clear bilaterally to auscultation without wheezes, rales, or rhonchi. Breathing is unlabored. Heart: RRR S1 S2 without murmurs, rubs, or gallops.  Abdomen: Soft, non-tender, non-distended with normoactive bowel sounds. No rebound/guarding. Extremities: No clubbing or cyanosis. No edema. Distal pedal pulses are 2+ and equal bilaterally. Neuro: Alert and oriented X 3. Moves all extremities spontaneously. Psych:  Responds to questions appropriately with agitated affect.  EKG:  The EKG was personally reviewed and demonstrates:  NSR 66bpm, LVH suspected but no acute STT changes Telemetry:  Telemetry was personally reviewed and demonstrates: NSR  Relevant CV Studies: 2D Echo 08/05/20   1. Left ventricular ejection fraction, by estimation, is 60 to 65%. The  left ventricle has normal function. The left ventricle has no regional  wall motion abnormalities. There is moderate asymmetric left ventricular  hypertrophy of the basal-septal  segment. Left ventricular diastolic parameters are consistent with Grade I  diastolic dysfunction (impaired relaxation).   2. Right ventricular systolic function is normal. The right ventricular  size is normal. There is normal pulmonary artery systolic pressure.   3. The mitral valve is normal in structure. No evidence of mitral valve  regurgitation.   4. The aortic valve is tricuspid. Aortic valve regurgitation is not  visualized.   5. The inferior vena cava is normal in size with greater than 50%  respiratory variability, suggesting right atrial pressure of 3 mmHg.    Comparison(s): No prior Echocardiogram.   Laboratory Data:  High Sensitivity Troponin:   Recent Labs  Lab 08/04/20 0624 08/04/20 0801  TROPONINIHS  5 7     Chemistry Recent Labs  Lab 08/04/20 0624 08/05/20 0229  NA 139 139  K 5.1 3.9  CL 107 106  CO2 20* 28  GLUCOSE 88 85  BUN 21 19  CREATININE 0.90 1.09  CALCIUM 8.5* 8.9  GFRNONAA >60 >60  ANIONGAP 12 5    Recent Labs  Lab 08/04/20 0801  PROT 7.1  ALBUMIN 3.6  AST 21  ALT 17  ALKPHOS 64  BILITOT 0.5   Hematology Recent Labs  Lab 08/04/20 0624 08/05/20 0229  WBC 8.4 8.7  RBC 4.89 4.57  HGB 15.1 14.1  HCT 47.1 42.1  MCV 96.3 92.1  MCH 30.9 30.9  MCHC 32.1 33.5  RDW 14.4 14.2  PLT 234 199   BNPNo results for input(s): BNP, PROBNP in the last 168 hours.  DDimer No results for input(s): DDIMER in the last 168 hours.   Radiology/Studies:  DG Chest 2 View  Result Date: 08/04/2020 CLINICAL DATA:  65 year old male with history of generalized chest pain. EXAM: CHEST - 2 VIEW COMPARISON:  Chest x-ray 01/23/2017. FINDINGS: Lung volumes are normal. No consolidative airspace disease. No pleural effusions. No pneumothorax. No pulmonary nodule or mass noted. Pulmonary vasculature and the cardiomediastinal silhouette are within normal limits. IMPRESSION: No radiographic evidence of acute cardiopulmonary disease. Electronically Signed   By: Vinnie Langton M.D.   On: 08/04/2020 06:53   ECHOCARDIOGRAM COMPLETE  Result Date: 08/05/2020    ECHOCARDIOGRAM REPORT   Patient Name:   Benito Willa Rough Date of Exam: 08/05/2020 Medical Rec #:  [id number]     Height:       65.0 in Accession #:    [id number]    Weight:       140.1 lb Date of Birth:  07/13/1955     BSA:          1.700 m Patient Age:    45 years      BP:           140/88 mmHg Patient Gender: M             HR:           55 bpm. Exam Location:  Inpatient Procedure: 2D Echo, Cardiac Doppler and Color Doppler Indications:    Chest pain  History:        Patient has no prior  history of Echocardiogram examinations.                 Stroke; Risk Factors:Diabetes.  Sonographer:    Luisa Hart RDCS Referring Phys: [id number] TODD D MCDIARMID  Sonographer Comments: No cardiac surgeries or procedures noted in chart IMPRESSIONS  1. Left ventricular ejection fraction, by estimation, is 60 to 65%. The left ventricle has normal function. The left ventricle has no regional wall motion abnormalities. There is moderate asymmetric left ventricular hypertrophy of the basal-septal segment. Left ventricular diastolic parameters are consistent with Grade I diastolic dysfunction (impaired relaxation).  2. Right ventricular systolic function is normal. The right ventricular size is normal. There is normal pulmonary artery systolic pressure.  3. The mitral valve is normal in structure. No evidence of mitral valve regurgitation.  4. The aortic valve is tricuspid. Aortic valve regurgitation is not visualized.  5. The inferior vena cava is normal in size with greater than 50% respiratory variability, suggesting right atrial pressure of 3 mmHg. Comparison(s): No prior Echocardiogram. FINDINGS  Left Ventricle: Left ventricular ejection fraction, by estimation, is 60 to 65%. The left ventricle  has normal function. The left ventricle has no regional wall motion abnormalities. The left ventricular internal cavity size was normal in size. There is  moderate asymmetric left ventricular hypertrophy of the basal-septal segment. Left ventricular diastolic parameters are consistent with Grade I diastolic dysfunction (impaired relaxation). Indeterminate filling pressures. Right Ventricle: The right ventricular size is normal. No increase in right ventricular wall thickness. Right ventricular systolic function is normal. There is normal pulmonary artery systolic pressure. The tricuspid regurgitant velocity is 1.90 m/s, and  with an assumed right atrial pressure of 3 mmHg, the estimated right ventricular systolic pressure is 39.7  mmHg. Left Atrium: Left atrial size was normal in size. Right Atrium: Right atrial size was normal in size. Pericardium: There is no evidence of pericardial effusion. Mitral Valve: The mitral valve is normal in structure. No evidence of mitral valve regurgitation. Tricuspid Valve: The tricuspid valve is grossly normal. Tricuspid valve regurgitation is trivial. Aortic Valve: The aortic valve is tricuspid. Aortic valve regurgitation is not visualized. Aortic valve mean gradient measures 3.0 mmHg. Aortic valve peak gradient measures 5.1 mmHg. Aortic valve area, by VTI measures 2.42 cm. Pulmonic Valve: The pulmonic valve was normal in structure. Pulmonic valve regurgitation is not visualized. Aorta: The aortic root and ascending aorta are structurally normal, with no evidence of dilitation. Venous: The inferior vena cava is normal in size with greater than 50% respiratory variability, suggesting right atrial pressure of 3 mmHg. IAS/Shunts: No atrial level shunt detected by color flow Doppler.  LEFT VENTRICLE PLAX 2D LVIDd:         4.00 cm     Diastology LVIDs:         2.15 cm     LV e' medial:    3.70 cm/s LV PW:         0.85 cm     LV E/e' medial:  15.5 LV IVS:        1.47 cm     LV e' lateral:   7.29 cm/s LVOT diam:     2.00 cm     LV E/e' lateral: 7.9 LV SV:         61 LV SV Index:   36 LVOT Area:     3.14 cm  LV Volumes (MOD) LV vol d, MOD A2C: 48.5 ml LV vol d, MOD A4C: 62.3 ml LV vol s, MOD A2C: 15.5 ml LV vol s, MOD A4C: 27.5 ml LV SV MOD A2C:     33.0 ml LV SV MOD A4C:     62.3 ml LV SV MOD BP:      35.2 ml RIGHT VENTRICLE RV S prime:     11.00 cm/s TAPSE (M-mode): 1.6 cm LEFT ATRIUM             Index       RIGHT ATRIUM          Index LA diam:        3.60 cm 2.12 cm/m  RA Area:     8.17 cm LA Vol (A2C):   44.0 ml 25.88 ml/m RA Volume:   12.10 ml 7.12 ml/m LA Vol (A4C):   25.6 ml 15.06 ml/m LA Biplane Vol: 35.2 ml 20.70 ml/m  AORTIC VALVE                   PULMONIC VALVE AV Area (Vmax):    2.27 cm    PV  Vmax:       0.74 m/s AV Area (Vmean):  2.40 cm    PV Vmean:      51.800 cm/s AV Area (VTI):     2.42 cm    PV VTI:        0.156 m AV Vmax:           113.00 cm/s PV Peak grad:  2.2 mmHg AV Vmean:          77.700 cm/s PV Mean grad:  1.0 mmHg AV VTI:            0.252 m AV Peak Grad:      5.1 mmHg AV Mean Grad:      3.0 mmHg LVOT Vmax:         81.70 cm/s LVOT Vmean:        59.400 cm/s LVOT VTI:          0.194 m LVOT/AV VTI ratio: 0.77  AORTA Ao Root diam: 3.30 cm Ao Asc diam:  2.60 cm MITRAL VALVE               TRICUSPID VALVE MV Area (PHT): 3.85 cm    TR Peak grad:   14.4 mmHg MV Decel Time: 197 msec    TR Vmax:        190.00 cm/s MV E velocity: 57.40 cm/s MV A velocity: 90.80 cm/s  SHUNTS MV E/A ratio:  0.63        Systemic VTI:  0.19 m                            Systemic Diam: 2.00 cm Lyman Bishop MD Electronically signed by Lyman Bishop MD Signature Date/Time: 08/05/2020/12:33:14 PM    Final    CT Angio Chest/Abd/Pel for Dissection W and/or Wo Contrast  Result Date: 08/04/2020 CLINICAL DATA:  65 year old male with chest pain. EXAM: CT ANGIOGRAPHY CHEST, ABDOMEN AND PELVIS TECHNIQUE: Non-contrast CT of the chest was initially obtained. Multidetector CT imaging through the chest, abdomen and pelvis was performed using the standard protocol during bolus administration of intravenous contrast. Multiplanar reconstructed images and MIPs were obtained and reviewed to evaluate the vascular anatomy. CONTRAST:  13mL OMNIPAQUE IOHEXOL 350 MG/ML SOLN COMPARISON:  CT abdomen pelvis from 09/20/2019 FINDINGS: CTA CHEST FINDINGS VASCULAR Preferential opacification of the thoracic aorta. No evidence of thoracic aortic aneurysm or dissection. Normal heart size. No pericardial effusion. Sinues of Valsalva: 26 mm 26 x 29 mm Sinotubular Junction: 26 mm Ascending Aorta: 30 mm Aortic Arch: 28 mm Descending aorta: 24 mm at the level of the carina Branch vessels: Conventional branching pattern. No significant atherosclerotic  changes. Coronary arteries: Normal origins and courses. Severe focal atherosclerotic calcification straddling the bifurcation of the left main coronary artery, extending into the left anterior descending and left circumflex coronary arteries. Main pulmonary artery: 26 mm. No evidence of central pulmonary embolism. Pulmonary veins: No anomalous pulmonary venous return. No evidence of left atrial appendage thrombus. Review of the MIP images confirms the above findings. NON VASCULAR Mediastinum/Nodes: No enlarged mediastinal, hilar, or axillary lymph nodes. Thyroid gland, trachea, and esophagus demonstrate no significant findings. Lungs/Pleura: Lungs are clear. No pleural effusion or pneumothorax. Musculoskeletal: No chest wall abnormality. No acute or significant osseous findings. CTA ABDOMEN AND PELVIS FINDINGS VASCULAR Aorta: Patent and normal caliber throughout. Scattered fibrofatty and calcified atherosclerotic changes, most prominent in the distal aorta. Celiac: Patent without evidence of aneurysm, dissection, vasculitis or significant stenosis. SMA: Patent without evidence of aneurysm, dissection, vasculitis or significant stenosis. Renals:  Single bilateral renal arteries are patent without evidence of aneurysm, dissection, vasculitis, fibromuscular dysplasia or significant stenosis. IMA: Patent without evidence of aneurysm, dissection, vasculitis or significant stenosis. Inflow: Circumferential atherosclerotic calcifications. Patent without evidence of aneurysm, dissection, vasculitis or significant stenosis. Veins: No obvious venous abnormality within the limitations of this arterial phase study. Review of the MIP images confirms the above findings. NON-VASCULAR Hepatobiliary: 8 mm hyperattenuating focus in the medial aspect of segment 2, mildly hypoattenuating on noncontrast phase. No additional focal hepatic abnormality. The gallbladder is present with multiple peripherally calcified infundibular gallstones.  No pericholecystic fluid or gallbladder wall thickening. No intra or extrahepatic biliary ductal dilation. Pancreas: Unremarkable. No pancreatic ductal dilatation or surrounding inflammatory changes. Spleen: Normal in size without focal abnormality. Adrenals/Urinary Tract: Adrenal glands are unremarkable. Kidneys are normal, without renal calculi, focal lesion, or hydronephrosis. Bladder is unremarkable. Stomach/Bowel: Stomach is within normal limits. Appendix appears normal. Scattered colonic diverticula, most pronounced in the descending colon without surrounding inflammatory changes. No evidence of bowel wall thickening, distention, or inflammatory changes. Lymphatic: No abdominopelvic lymphadenopathy. Reproductive: Prostate is unremarkable. Other: No abdominal wall hernia or abnormality. No abdominopelvic ascites. Musculoskeletal: No acute or significant osseous findings. IMPRESSION: VASCULAR 1. No evidence of acute aortic syndrome. 2. Severe proximal left anterior descending and left circumflex coronary arterial atherosclerotic calcifications. 3.  Aortic Atherosclerosis (ICD10-I70.0). NON VASCULAR 1. Hyperattenuating subcentimeter focus in the left lobe of the liver, most compatible flash filling hepatic hemangioma. 2. Diverticulosis, no evidence of diverticulitis. Ruthann Cancer, MD [facility] Radiology Electronically Signed   By: Ruthann Cancer MD   On: 08/04/2020 10:01     Assessment and Plan:   1. Epigastric/abdominal pain with dysphagia - per IM/GI, planning EGD tomorrow - do not see any cardiac contraindications to proceeding  2. Atypical chest pain (with coronary artery calcification seen on CT scan) - troponins negative, EKG reassuring, arguing against ischemic etiology for his fairly persistent symptoms - will review further evaluation with MD - anticipate outpatient ischemic eval is appropriate - would suggest risk factor modification and  abstinence of harmful substances  3. Probable essential HTN - prior OP BP 132/85 so borderline hypertensive in the past - 2d echocardiogram also suggests some LVH which may be sequelae of elevated BP with time - per d/w MD, will start amlodipine which would have anti-spasm and BP effect - check TSH  4. Polysubstance abuse - cessation advised  - will defer consideration of CIWA protocol to primary team    Risk Assessment/Risk Scores:     HEAR Score (for undifferentiated chest pain):  HEAR Score: 4  For questions or updates, please contact [place] Please consult [email or web address] for contact info under    Signed, Charlie Pitter, PA-C  08/05/2020 5:08 PM  Personally seen and examined. Agree with APP above with the following comments: Briefly 65 yo M with history of R eye blindness, present alcohol, tobacco, and cocaine abuse who presents for atypical pain  Patient notes that his blood pressure is always fine, despite LVH on Echo.  Notes swallowing issues, that have occurred multiple times.  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 Exam notable for Bilateral Frank's Sign, and R eye Ptosis, otherwise benign. Labs notable for negative novel troponin testing Personally reviewed relevant tests; Echo  shows notable ventricular hypertrophy concerning for persistent HTN, prox LAD calcification is noted (shows patient CT scan). Would recommend  - reasonable to start low dose amlodipine for BP - reasonable to proceed with EGD (relatively low risk procedure) - offered statin to decrease LDL < 70; patient notes he once got a pain medication that was suppose to help his pain, but made it worse - we discussed that as a compromise, if he could be cocaine free from now until outpatient visit, we will order a CCTA to rule out  obstructive lesions   Rudean Haskell, MD [address] [place], [place] [address] [phone]  5:35 PM

## 2020-08-05 NOTE — Progress Notes (Signed)
Family Medicine Teaching Service Daily Progress Note Intern Pager: [phone]  Patient name: [name] Medical record number: [id number] Date of birth: [birth year] Age: 65 y.o. Gender: male  Primary Care Provider: Burman Riis, NP Consultants: GI, cardiology   Code Status: Full Code  Pt Overview and Major Events to Date:  4/10 admitted  Assessment and Plan: [name] is a 65 y.o. male presenting with epigastric pain secondary to NSAID use. PMH is significant for HTN, current smoker, alcohol use, cocaine use, seizure disorder, blindness in right eye.  Epigastric pain Likely NSAID gastritis, possible PUD though no symptoms to suggest GI bleed and Hgb stable. No significant improvement on PPI, will increase dose and add H2 blocker. Consult GI whether EGD would be appropriate inpatient. - increase to BID PPI - H2 blocker prn - avoid NSAIDs - consult GI, appreciate recommendations  Coronary calcifications Seen on CTA. - cardiology f/u prior to discharge - TTE  HTN Improved this morning, likely some pain component. Will consider starting antihypertensive if persistently elevated.  Seizure disorder - continue home VPA, levetiracetam, phenytoin  Substance use Uses alcohol, tobacco, cocaine. Low suspicion of alcohol withdrawal. - encourage cessation  FEN/GI: heart-healthy diet PPx: LMWH  Disposition: med-tele, likely home when stable  Subjective:  NAOE.  Patient states he still has abdominal pain, not significantly improved.  Objective: Temp:  [98.2 F (36.8 C)-98.7 F (37.1 C)] 98.6 F (37 C) (04/11 0457) Pulse Rate:  [60-80] 80 (04/10 2100) Resp:  [10-17] 16 (04/11 0457) BP: (130-178)/(80-120) 132/86 (04/11 0457) SpO2:  [95 %-99 %] 96 % (04/10 2100) Weight:  [63.5 kg] 63.5 kg (04/10 1400) Physical Exam: General: Resting in bed comfortably, NAD Cardiovascular: RRR, no murmurs Respiratory: CTAB, no respiratory distress Abdomen: Soft, mild tenderness  to epigastric and RUQ, +BS Extremities: WWP, no edema  Laboratory: Recent Labs  Lab 08/04/20 0624 08/05/20 0229  WBC 8.4 8.7  HGB 15.1 14.1  HCT 47.1 42.1  PLT 234 199   Recent Labs  Lab 08/04/20 0624 08/04/20 0801 08/05/20 0229  NA 139  --  139  K 5.1  --  3.9  CL 107  --  106  CO2 20*  --  28  BUN 21  --  19  CREATININE 0.90  --  1.09  CALCIUM 8.5*  --  8.9  PROT  --  7.1  --   BILITOT  --  0.5  --   ALKPHOS  --  64  --   ALT  --  17  --   AST  --  21  --   GLUCOSE 88  --  85     Imaging/Diagnostic Tests: ECHOCARDIOGRAM COMPLETE  Result Date: 08/05/2020    ECHOCARDIOGRAM REPORT   Patient Name:   [name] Date of Exam: 08/05/2020 Medical Rec #:  [id number]     Height:       65.0 in Accession #:    [id number]    Weight:       140.1 lb Date of Birth:  20-Nov-1955     BSA:          1.700 m Patient Age:    70 years      BP:           140/88 mmHg Patient Gender: M             HR:           55 bpm. Exam Location:  Inpatient Procedure: 2D Echo,  Cardiac Doppler and Color Doppler Indications:    Chest pain  History:        Patient has no prior history of Echocardiogram examinations.                 Stroke; Risk Factors:Diabetes.  Sonographer:    Luisa Hart RDCS Referring Phys: [id number] TODD D MCDIARMID  Sonographer Comments: No cardiac surgeries or procedures noted in chart IMPRESSIONS  1. Left ventricular ejection fraction, by estimation, is 60 to 65%. The left ventricle has normal function. The left ventricle has no regional wall motion abnormalities. There is moderate asymmetric left ventricular hypertrophy of the basal-septal segment. Left ventricular diastolic parameters are consistent with Grade I diastolic dysfunction (impaired relaxation).  2. Right ventricular systolic function is normal. The right ventricular size is normal. There is normal pulmonary artery systolic pressure.  3. The mitral valve is normal in structure. No evidence of mitral valve regurgitation.  4. The aortic  valve is tricuspid. Aortic valve regurgitation is not visualized.  5. The inferior vena cava is normal in size with greater than 50% respiratory variability, suggesting right atrial pressure of 3 mmHg. Comparison(s): No prior Echocardiogram. FINDINGS  Left Ventricle: Left ventricular ejection fraction, by estimation, is 60 to 65%. The left ventricle has normal function. The left ventricle has no regional wall motion abnormalities. The left ventricular internal cavity size was normal in size. There is  moderate asymmetric left ventricular hypertrophy of the basal-septal segment. Left ventricular diastolic parameters are consistent with Grade I diastolic dysfunction (impaired relaxation). Indeterminate filling pressures. Right Ventricle: The right ventricular size is normal. No increase in right ventricular wall thickness. Right ventricular systolic function is normal. There is normal pulmonary artery systolic pressure. The tricuspid regurgitant velocity is 1.90 m/s, and  with an assumed right atrial pressure of 3 mmHg, the estimated right ventricular systolic pressure is 06.3 mmHg. Left Atrium: Left atrial size was normal in size. Right Atrium: Right atrial size was normal in size. Pericardium: There is no evidence of pericardial effusion. Mitral Valve: The mitral valve is normal in structure. No evidence of mitral valve regurgitation. Tricuspid Valve: The tricuspid valve is grossly normal. Tricuspid valve regurgitation is trivial. Aortic Valve: The aortic valve is tricuspid. Aortic valve regurgitation is not visualized. Aortic valve mean gradient measures 3.0 mmHg. Aortic valve peak gradient measures 5.1 mmHg. Aortic valve area, by VTI measures 2.42 cm. Pulmonic Valve: The pulmonic valve was normal in structure. Pulmonic valve regurgitation is not visualized. Aorta: The aortic root and ascending aorta are structurally normal, with no evidence of dilitation. Venous: The inferior vena cava is normal in size with  greater than 50% respiratory variability, suggesting right atrial pressure of 3 mmHg. IAS/Shunts: No atrial level shunt detected by color flow Doppler.  LEFT VENTRICLE PLAX 2D LVIDd:         4.00 cm     Diastology LVIDs:         2.15 cm     LV e' medial:    3.70 cm/s LV PW:         0.85 cm     LV E/e' medial:  15.5 LV IVS:        1.47 cm     LV e' lateral:   7.29 cm/s LVOT diam:     2.00 cm     LV E/e' lateral: 7.9 LV SV:         61 LV SV Index:   36 LVOT Area:  3.14 cm  LV Volumes (MOD) LV vol d, MOD A2C: 48.5 ml LV vol d, MOD A4C: 62.3 ml LV vol s, MOD A2C: 15.5 ml LV vol s, MOD A4C: 27.5 ml LV SV MOD A2C:     33.0 ml LV SV MOD A4C:     62.3 ml LV SV MOD BP:      35.2 ml RIGHT VENTRICLE RV S prime:     11.00 cm/s TAPSE (M-mode): 1.6 cm LEFT ATRIUM             Index       RIGHT ATRIUM          Index LA diam:        3.60 cm 2.12 cm/m  RA Area:     8.17 cm LA Vol (A2C):   44.0 ml 25.88 ml/m RA Volume:   12.10 ml 7.12 ml/m LA Vol (A4C):   25.6 ml 15.06 ml/m LA Biplane Vol: 35.2 ml 20.70 ml/m  AORTIC VALVE                   PULMONIC VALVE AV Area (Vmax):    2.27 cm    PV Vmax:       0.74 m/s AV Area (Vmean):   2.40 cm    PV Vmean:      51.800 cm/s AV Area (VTI):     2.42 cm    PV VTI:        0.156 m AV Vmax:           113.00 cm/s PV Peak grad:  2.2 mmHg AV Vmean:          77.700 cm/s PV Mean grad:  1.0 mmHg AV VTI:            0.252 m AV Peak Grad:      5.1 mmHg AV Mean Grad:      3.0 mmHg LVOT Vmax:         81.70 cm/s LVOT Vmean:        59.400 cm/s LVOT VTI:          0.194 m LVOT/AV VTI ratio: 0.77  AORTA Ao Root diam: 3.30 cm Ao Asc diam:  2.60 cm MITRAL VALVE               TRICUSPID VALVE MV Area (PHT): 3.85 cm    TR Peak grad:   14.4 mmHg MV Decel Time: 197 msec    TR Vmax:        190.00 cm/s MV E velocity: 57.40 cm/s MV A velocity: 90.80 cm/s  SHUNTS MV E/A ratio:  0.63        Systemic VTI:  0.19 m                            Systemic Diam: 2.00 cm Lyman Bishop MD Electronically signed by Lyman Bishop MD Signature Date/Time: 08/05/2020/12:33:14 PM    Final      Zola Button, MD 08/05/2020, 7:41 AM PGY-1, [place] Intern pager: [phone], text pages welcome

## 2020-08-06 ENCOUNTER — Observation Stay (HOSPITAL_COMMUNITY): Payer: 59 | Admitting: Certified Registered Nurse Anesthetist

## 2020-08-06 ENCOUNTER — Encounter (HOSPITAL_COMMUNITY)
Admission: EM | Disposition: A | Discharge status: Home / Self Care | Payer: Self-pay | Source: Home / Self Care | Attending: Emergency Medicine

## 2020-08-06 ENCOUNTER — Encounter (HOSPITAL_COMMUNITY): Payer: Self-pay | Admitting: Family Medicine

## 2020-08-06 ENCOUNTER — Other Ambulatory Visit: Payer: Self-pay

## 2020-08-06 DIAGNOSIS — K449 Diaphragmatic hernia without obstruction or gangrene: Secondary | ICD-10-CM | POA: Diagnosis not present

## 2020-08-06 DIAGNOSIS — R1013 Epigastric pain: Secondary | ICD-10-CM | POA: Diagnosis not present

## 2020-08-06 DIAGNOSIS — B9681 Helicobacter pylori [H. pylori] as the cause of diseases classified elsewhere: Secondary | ICD-10-CM

## 2020-08-06 DIAGNOSIS — R072 Precordial pain: Secondary | ICD-10-CM | POA: Diagnosis not present

## 2020-08-06 DIAGNOSIS — K259 Gastric ulcer, unspecified as acute or chronic, without hemorrhage or perforation: Secondary | ICD-10-CM

## 2020-08-06 DIAGNOSIS — F1410 Cocaine abuse, uncomplicated: Secondary | ICD-10-CM | POA: Diagnosis not present

## 2020-08-06 DIAGNOSIS — K253 Acute gastric ulcer without hemorrhage or perforation: Secondary | ICD-10-CM | POA: Diagnosis not present

## 2020-08-06 DIAGNOSIS — K2980 Duodenitis without bleeding: Secondary | ICD-10-CM | POA: Diagnosis not present

## 2020-08-06 DIAGNOSIS — I700 Atherosclerosis of aorta: Secondary | ICD-10-CM | POA: Diagnosis not present

## 2020-08-06 DIAGNOSIS — Z20822 Contact with and (suspected) exposure to covid-19: Secondary | ICD-10-CM | POA: Diagnosis not present

## 2020-08-06 HISTORY — PX: BIOPSY: SHX5522

## 2020-08-06 HISTORY — DX: Gastric ulcer, unspecified as acute or chronic, without hemorrhage or perforation: B96.81

## 2020-08-06 HISTORY — DX: Helicobacter pylori (H. pylori) as the cause of diseases classified elsewhere: K25.9

## 2020-08-06 HISTORY — PX: ESOPHAGOGASTRODUODENOSCOPY (EGD) WITH PROPOFOL: SHX5813

## 2020-08-06 LAB — CBC
HCT: 41.6 % (ref 39.0–52.0)
Hemoglobin: 13.8 g/dL (ref 13.0–17.0)
MCH: 30.9 pg (ref 26.0–34.0)
MCHC: 33.2 g/dL (ref 30.0–36.0)
MCV: 93.3 fL (ref 80.0–100.0)
Platelets: 164 10*3/uL (ref 150–400)
RBC: 4.46 MIL/uL (ref 4.22–5.81)
RDW: 14.2 % (ref 11.5–15.5)
WBC: 8.7 10*3/uL (ref 4.0–10.5)
nRBC: 0 % (ref 0.0–0.2)

## 2020-08-06 LAB — TSH: TSH: 0.984 u[IU]/mL (ref 0.350–4.500)

## 2020-08-06 SURGERY — ESOPHAGOGASTRODUODENOSCOPY (EGD) WITH PROPOFOL
Anesthesia: Monitor Anesthesia Care

## 2020-08-06 MED ORDER — PANTOPRAZOLE SODIUM 40 MG PO TBEC: 40.0000 mg | DELAYED_RELEASE_TABLET | Freq: Two times a day (BID) | ORAL | 0 refills | Status: DC

## 2020-08-06 MED ORDER — PHENYTOIN SODIUM EXTENDED 100 MG PO CAPS: 200.0000 mg | ORAL_CAPSULE | Freq: Two times a day (BID) | ORAL | Status: DC

## 2020-08-06 MED ORDER — PROPOFOL 500 MG/50ML IV EMUL
INTRAVENOUS | Status: DC | PRN
  Administered 2020-08-06: 150 ug/kg/min via INTRAVENOUS
  Administered 2020-08-06: 200 ug/kg/min via INTRAVENOUS

## 2020-08-06 MED ORDER — PROPOFOL 10 MG/ML IV BOLUS
INTRAVENOUS | Status: DC | PRN
  Administered 2020-08-06: 20 mg via INTRAVENOUS
  Administered 2020-08-06: 50 mg via INTRAVENOUS

## 2020-08-06 MED ORDER — LIDOCAINE 2% (20 MG/ML) 5 ML SYRINGE
INTRAMUSCULAR | Status: DC | PRN
  Administered 2020-08-06: 60 mg via INTRAVENOUS

## 2020-08-06 MED ORDER — LACTATED RINGERS IV SOLN: INTRAVENOUS | Status: DC | PRN

## 2020-08-06 MED ORDER — FAMOTIDINE 20 MG PO TABS: 20.0000 mg | ORAL_TABLET | Freq: Two times a day (BID) | ORAL | 0 refills | Status: DC | PRN

## 2020-08-06 MED ORDER — ALUM HYDROXIDE-MAG CARBONATE 95-358 MG/15ML PO SUSP: 30.0000 mL | Freq: Three times a day (TID) | ORAL | 0 refills | Status: AC

## 2020-08-06 MED ORDER — LACTATED RINGERS IV SOLN: INTRAVENOUS | Status: DC

## 2020-08-06 MED ORDER — AMLODIPINE BESYLATE 5 MG PO TABS: 5.0000 mg | ORAL_TABLET | Freq: Every day | ORAL | 0 refills | Status: DC

## 2020-08-06 MED ORDER — PHENYTOIN SODIUM EXTENDED 100 MG PO CAPS
100.0000 mg | ORAL_CAPSULE | Freq: Two times a day (BID) | ORAL | Status: DC
  Filled 2020-08-06: qty 1

## 2020-08-06 SURGICAL SUPPLY — 15 items

## 2020-08-06 NOTE — Discharge Summary (Signed)
[facility] Discharge Summary  Patient name: [name] Medical record number: [id number] Date of birth: 1956-02-24 Age: 65 y.o. Gender: male Date of Admission: 08/04/2020  Date of Discharge: 08/06/2020  Admitting Physician: Zola Button, MD  Primary Care Provider: Burman Riis, NP Consultants: GI, cardiology  Indication for Hospitalization: PUD  Discharge Diagnoses/Problem List:  Principal Problem:   Chest pain Active Problems:   Localization-related symptomatic epilepsy and epileptic syndromes with complex partial seizures, not intractable, without status epilepticus ([place])   Hypertension   Multiple gallstones   Coronary artery calcification seen on CAT scan   Atherosclerosis of aorta (H[facility])   Tobacco dependence with current use   Dysphagia, pharyngeal   Acute epigastric pain   LVH (left ventricular hypertrophy)   Cocaine abuse ([place])   Acute gastric ulcer without hemorrhage or perforation    Disposition: home  Discharge Condition: Stable  Discharge Exam:  General: Resting in bed comfortably, NAD Cardiovascular: RRR, no murmurs Respiratory: CTAB, no respiratory distress Abdomen: Soft, mild tenderness to epigastric and LUQ, +BS Extremities: WWP, no edema   Brief Hospital Course:  [name] is a 65 y.o. male with a history of HTN, seizure disorder, blindness in right eye, current smoker, alcohol use, cocaine use who presented with epigastric pain found to have gastric ulcer. Hospital course outlined by problem below:  PUD Patient presented with 3 days of intermittent epigastric/chest pain.  In the ED, he had negative ACS work-up and no evidence of aortic dissection on CTA.  EGD was performed on 4/12 with findings of 15 mm nonbleeding gastric ulcer and duodenitis.  He was discharged on twice daily PPI, as needed H2 blocker, and Gaviscon.  Coronary calcifications Patient was noted to have severe atherosclerotic calcifications in  the LAD and LCx seen on CTA.  TTE was obtained which revealed EF 60 to 65% with grade 1 diastolic dysfunction.  Cardiology was consulted and arranged for outpatient follow-up, considering outpatient CCTA at follow-up.  HTN Patient was hypertensive on admission up to 168/120.  BP was intermittently elevated throughout admission and he was started on amlodipine 5 mg during hospitalization.  Other problems chronic and stable.   Issues for Follow Up:  1. Patient to continue pantoprazole 40 mg BID for at least 12 weeks, then likely once daily per GI 2. Repeat EGD in 3 months 3. No ASA or NSAIDs 4. Patient encouraged to stop cocaine, alcohol, and smoking 5. A1c 5.7 in pre-diabetes range  Significant Procedures:  EGD 4/12  Significant Labs and Imaging:  Recent Labs  Lab 08/04/20 0624 08/05/20 0229 08/06/20 0425  WBC 8.4 8.7 8.7  HGB 15.1 14.1 13.8  HCT 47.1 42.1 41.6  PLT 234 199 164   Recent Labs  Lab 08/04/20 0624 08/04/20 0801 08/05/20 0229  NA 139  --  139  K 5.1  --  3.9  CL 107  --  106  CO2 20*  --  28  GLUCOSE 88  --  85  BUN 21  --  19  CREATININE 0.90  --  1.09  CALCIUM 8.5*  --  8.9  ALKPHOS  --  64  --   AST  --  21  --   ALT  --  17  --   ALBUMIN  --  3.6  --      Results/Tests Pending at Time of Discharge: none  Discharge Medications:  Allergies as of 08/06/2020      Reactions   Mobic [  meloxicam] Nausea Only   "pain all over"      Medication List    STOP taking these medications   cephALEXin 500 MG capsule Commonly known as: Keflex   HYDROcodone-acetaminophen 5-325 MG tablet Commonly known as: NORCO/VICODIN     TAKE these medications   acetaminophen 325 MG tablet Commonly known as: TYLENOL Take 2 tablets (650 mg total) by mouth every 6 (six) hours as needed for mild pain.   aluminum hydroxide-magnesium carbonate 95-358 MG/15ML Susp Commonly known as: GAVISCON Take 30 mLs by mouth 4 (four) times daily - after meals and at bedtime.    amLODipine 5 MG tablet Commonly known as: NORVASC Take 1 tablet (5 mg total) by mouth daily. Start taking on: August 07, 2020   divalproex 500 MG 24 hr tablet Commonly known as: DEPAKOTE ER Take 4 tablets twice daily   famotidine 20 MG tablet Commonly known as: PEPCID Take 1 tablet (20 mg total) by mouth 2 (two) times daily as needed for heartburn or indigestion.   levETIRAcetam 750 MG tablet Commonly known as: KEPPRA Take 1 tablet (750 mg total) by mouth 2 (two) times daily.   pantoprazole 40 MG tablet Commonly known as: PROTONIX Take 1 tablet (40 mg total) by mouth 2 (two) times daily.   phenytoin 100 MG ER capsule Commonly known as: DILANTIN Take 2 capsules (200 mg total) by mouth 2 (two) times daily.   phenytoin 50 MG tablet Commonly known as: Phenytoin Infatabs CHEW 1 TABLET (50 MG TOTAL) BY MOUTH AT BEDTIME.   cholecalciferol 25 MCG (1000 UNIT) tablet Commonly known as: VITAMIN D3 Take 1,000 Units by mouth daily.   Vitamin D 50 MCG (2000 UT) tablet Take 2,000 Units by mouth daily.       Discharge Instructions: Please refer to Patient Instructions section of EMR for full details.  Patient was counseled important signs and symptoms that should prompt return to medical care, changes in medications, dietary instructions, activity restrictions, and follow up appointments.   Follow-Up Appointments:  Follow-up Information    [facility] In 1 day.   Why: Call tomorrow for an appointment Contact information: [place] [phone] [place].   Specialty: Emergency Medicine Why: If symptoms worsen Contact information: [address] [id number] [place]       Imogene Burn, PA-C Follow up on 09/17/2020.   Specialty: Cardiology Why: Please arrive 15 min early than  your cardiology hospital follow up appointment at 11:15 am Contact information: [address] [place] [address] [phone]               Zola Button, MD 08/06/2020, 6:32 PM PGY-1, [place]

## 2020-08-06 NOTE — Interval H&P Note (Signed)
History and Physical Interval Note: For EGD to eval upper abd pain.  Cardiac cleared for procedure though he does have multiple vessel atherosclerosis. The nature of the procedure, as well as the risks, benefits, and alternatives were carefully and thoroughly reviewed with the patient. Ample time for discussion and questions allowed. The patient understood, was satisfied, and agreed to proceed.   08/06/2020 11:23 AM  [name]  has presented today for surgery, with the diagnosis of Non cardiac, nonanginal chest pain, mid abdominal pain..  The various methods of treatment have been discussed with the patient and family. After consideration of risks, benefits and other options for treatment, the patient has consented to  Procedure(s): ESOPHAGOGASTRODUODENOSCOPY (EGD) WITH PROPOFOL (N/A) as a surgical intervention.  The patient's history has been reviewed, patient examined, no change in status, stable for surgery.  I have reviewed the patient's chart and labs.  Questions were answered to the patient's satisfaction.     [name]

## 2020-08-06 NOTE — Anesthesia Preprocedure Evaluation (Addendum)
Anesthesia Evaluation  Patient identified by MRN, date of birth, ID band Patient awake    Reviewed: Allergy & Precautions, NPO status , Patient's Chart, lab work & pertinent test results  Airway Mallampati: I  TM Distance: >3 FB Neck ROM: Full    Dental  (+) Partial Upper, Partial Lower,    Pulmonary neg pulmonary ROS, Current Smoker,    Pulmonary exam normal breath sounds clear to auscultation       Cardiovascular hypertension, + CAD  Normal cardiovascular exam Rhythm:Regular Rate:Normal  TTE 2022 1. Left ventricular ejection fraction, by estimation, is 60 to 65%. The  left ventricle has normal function. The left ventricle has no regional  wall motion abnormalities. There is moderate asymmetric left ventricular  hypertrophy of the basal-septal  segment. Left ventricular diastolic parameters are consistent with Grade I  diastolic dysfunction (impaired relaxation).  2. Right ventricular systolic function is normal. The right ventricular  size is normal. There is normal pulmonary artery systolic pressure.  3. The mitral valve is normal in structure. No evidence of mitral valve  regurgitation.  4. The aortic valve is tricuspid. Aortic valve regurgitation is not  visualized.  5. The inferior vena cava is normal in size with greater than 50%  respiratory variability, suggesting right atrial pressure of 3 mmHg   Neuro/Psych Seizures - (on keppra, dilantin, depakote), Well Controlled,  CVA negative psych ROS   GI/Hepatic negative GI ROS, (+)     substance abuse  cocaine use,   Endo/Other  negative endocrine ROSdiabetes  Renal/GU negative Renal ROS  negative genitourinary   Musculoskeletal negative musculoskeletal ROS (+)   Abdominal   Peds  Hematology negative hematology ROS (+)   Anesthesia Other Findings EGD for epigastric pain  Reproductive/Obstetrics                            Anesthesia Physical Anesthesia Plan  ASA: III  Anesthesia Plan: MAC   Post-op Pain Management:    Induction: Intravenous  PONV Risk Score and Plan: Propofol infusion and Treatment may vary due to age or medical condition  Airway Management Planned: Natural Airway  Additional Equipment:   Intra-op Plan:   Post-operative Plan:   Informed Consent: I have reviewed the patients History and Physical, chart, labs and discussed the procedure including the risks, benefits and alternatives for the proposed anesthesia with the patient or authorized representative who has indicated his/her understanding and acceptance.     Dental advisory given  Plan Discussed with: CRNA  Anesthesia Plan Comments:         Anesthesia Quick Evaluation

## 2020-08-06 NOTE — Progress Notes (Signed)
Progress Note  Patient Name: [name] Date of Encounter: 08/06/2020  Primary Cardiologist: No primary care provider on file.   Subjective   No CP over night.  Feels back to normal and ready to eat.  Inpatient Medications    Scheduled Meds: . acetaminophen  650 mg Oral Q6H   Or  . acetaminophen  650 mg Rectal Q6H  . aluminum hydroxide-magnesium carbonate  30 mL Oral TID PC & HS  . amLODipine  5 mg Oral Daily  . divalproex  2,000 mg Oral BID  . enoxaparin (LOVENOX) injection  40 mg Subcutaneous Q24H  . levETIRAcetam  750 mg Oral BID  . pantoprazole  40 mg Oral BID  . phenytoin  50 mg Oral QHS  . phenytoin  100 mg Oral BID   Continuous Infusions: . lactated ringers Stopped (08/06/20 1221)   PRN Meds: famotidine   Vital Signs    Vitals:   08/06/20 1158 08/06/20 1210 08/06/20 1219 08/06/20 1248  BP: 116/79 (!) 150/94 (!) 166/92 (!) 150/94  Pulse: 71 68 64 (!) 58  Resp: 19 13 17 12   Temp: 97.7 F (36.5 C)   98.2 F (36.8 C)  TempSrc: Temporal   Oral  SpO2:  99% 99%   Weight:      Height:        Intake/Output Summary (Last 24 hours) at 08/06/2020 1301 Last data filed at 08/05/2020 2003 Gross per 24 hour  Intake 480 ml  Output 350 ml  Net 130 ml   Filed Weights   08/04/20 0631 08/04/20 1400 08/06/20 0700  Weight: 67.6 kg 63.5 kg 64 kg    Telemetry    Sinus bradycardia to sinus rhythm with artifact - Personally Reviewed  ECG    SR 60 LVH - Personally Reviewed  Physical Exam   GEN: No acute distress.   Neck: No JVD Cardiac: RRR, no murmurs, rubs, or gallops.  Respiratory: Clear to auscultation bilaterally. GI: Soft, nontender, non-distended  MS: No edema; No deformity. Neuro:  Nonfocal  Psych: Normal affect   Labs    Chemistry Recent Labs  Lab 08/04/20 0624 08/04/20 0801 08/05/20 0229  NA 139  --  139  K 5.1  --  3.9  CL 107  --  106  CO2 20*  --  28  GLUCOSE 88  --  85  BUN 21  --  19  CREATININE 0.90  --  1.09  CALCIUM 8.5*   --  8.9  PROT  --  7.1  --   ALBUMIN  --  3.6  --   AST  --  21  --   ALT  --  17  --   ALKPHOS  --  64  --   BILITOT  --  0.5  --   GFRNONAA >60  --  >60  ANIONGAP 12  --  5     Hematology Recent Labs  Lab 08/04/20 0624 08/05/20 0229 08/06/20 0425  WBC 8.4 8.7 8.7  RBC 4.89 4.57 4.46  HGB 15.1 14.1 13.8  HCT 47.1 42.1 41.6  MCV 96.3 92.1 93.3  MCH 30.9 30.9 30.9  MCHC 32.1 33.5 33.2  RDW 14.4 14.2 14.2  PLT 234 199 164    Cardiac EnzymesNo results for input(s): TROPONINI in the last 168 hours. No results for input(s): TROPIPOC in the last 168 hours.   BNPNo results for input(s): BNP, PROBNP in the last 168 hours.   DDimer No results for input(s): DDIMER  in the last 168 hours.   Radiology    ECHOCARDIOGRAM COMPLETE  Result Date: 08/05/2020    ECHOCARDIOGRAM REPORT   Patient Name:   [name] Rough Date of Exam: 08/05/2020 Medical Rec #:  [id number]     Height:       65.0 in Accession #:    [id number]    Weight:       140.1 lb Date of Birth:  [birth year]     BSA:          1.700 m Patient Age:    65 years      BP:           140/88 mmHg Patient Gender: M             HR:           55 bpm. Exam Location:  Inpatient Procedure: 2D Echo, Cardiac Doppler and Color Doppler Indications:    Chest pain  History:        Patient has no prior history of Echocardiogram examinations.                 Stroke; Risk Factors:Diabetes.  Sonographer:    Luisa Hart RDCS Referring Phys: [id number] TODD D MCDIARMID  Sonographer Comments: No cardiac surgeries or procedures noted in chart IMPRESSIONS  1. Left ventricular ejection fraction, by estimation, is 60 to 65%. The left ventricle has normal function. The left ventricle has no regional wall motion abnormalities. There is moderate asymmetric left ventricular hypertrophy of the basal-septal segment. Left ventricular diastolic parameters are consistent with Grade I diastolic dysfunction (impaired relaxation).  2. Right ventricular systolic function is normal. The  right ventricular size is normal. There is normal pulmonary artery systolic pressure.  3. The mitral valve is normal in structure. No evidence of mitral valve regurgitation.  4. The aortic valve is tricuspid. Aortic valve regurgitation is not visualized.  5. The inferior vena cava is normal in size with greater than 50% respiratory variability, suggesting right atrial pressure of 3 mmHg. Comparison(s): No prior Echocardiogram. FINDINGS  Left Ventricle: Left ventricular ejection fraction, by estimation, is 60 to 65%. The left ventricle has normal function. The left ventricle has no regional wall motion abnormalities. The left ventricular internal cavity size was normal in size. There is  moderate asymmetric left ventricular hypertrophy of the basal-septal segment. Left ventricular diastolic parameters are consistent with Grade I diastolic dysfunction (impaired relaxation). Indeterminate filling pressures. Right Ventricle: The right ventricular size is normal. No increase in right ventricular wall thickness. Right ventricular systolic function is normal. There is normal pulmonary artery systolic pressure. The tricuspid regurgitant velocity is 1.90 m/s, and  with an assumed right atrial pressure of 3 mmHg, the estimated right ventricular systolic pressure is 27.7 mmHg. Left Atrium: Left atrial size was normal in size. Right Atrium: Right atrial size was normal in size. Pericardium: There is no evidence of pericardial effusion. Mitral Valve: The mitral valve is normal in structure. No evidence of mitral valve regurgitation. Tricuspid Valve: The tricuspid valve is grossly normal. Tricuspid valve regurgitation is trivial. Aortic Valve: The aortic valve is tricuspid. Aortic valve regurgitation is not visualized. Aortic valve mean gradient measures 3.0 mmHg. Aortic valve peak gradient measures 5.1 mmHg. Aortic valve area, by VTI measures 2.42 cm. Pulmonic Valve: The pulmonic valve was normal in structure. Pulmonic valve  regurgitation is not visualized. Aorta: The aortic root and ascending aorta are structurally normal, with no evidence of dilitation. Venous: The inferior vena cava  is normal in size with greater than 50% respiratory variability, suggesting right atrial pressure of 3 mmHg. IAS/Shunts: No atrial level shunt detected by color flow Doppler.  LEFT VENTRICLE PLAX 2D LVIDd:         4.00 cm     Diastology LVIDs:         2.15 cm     LV e' medial:    3.70 cm/s LV PW:         0.85 cm     LV E/e' medial:  15.5 LV IVS:        1.47 cm     LV e' lateral:   7.29 cm/s LVOT diam:     2.00 cm     LV E/e' lateral: 7.9 LV SV:         61 LV SV Index:   36 LVOT Area:     3.14 cm  LV Volumes (MOD) LV vol d, MOD A2C: 48.5 ml LV vol d, MOD A4C: 62.3 ml LV vol s, MOD A2C: 15.5 ml LV vol s, MOD A4C: 27.5 ml LV SV MOD A2C:     33.0 ml LV SV MOD A4C:     62.3 ml LV SV MOD BP:      35.2 ml RIGHT VENTRICLE RV S prime:     11.00 cm/s TAPSE (M-mode): 1.6 cm LEFT ATRIUM             Index       RIGHT ATRIUM          Index LA diam:        3.60 cm 2.12 cm/m  RA Area:     8.17 cm LA Vol (A2C):   44.0 ml 25.88 ml/m RA Volume:   12.10 ml 7.12 ml/m LA Vol (A4C):   25.6 ml 15.06 ml/m LA Biplane Vol: 35.2 ml 20.70 ml/m  AORTIC VALVE                   PULMONIC VALVE AV Area (Vmax):    2.27 cm    PV Vmax:       0.74 m/s AV Area (Vmean):   2.40 cm    PV Vmean:      51.800 cm/s AV Area (VTI):     2.42 cm    PV VTI:        0.156 m AV Vmax:           113.00 cm/s PV Peak grad:  2.2 mmHg AV Vmean:          77.700 cm/s PV Mean grad:  1.0 mmHg AV VTI:            0.252 m AV Peak Grad:      5.1 mmHg AV Mean Grad:      3.0 mmHg LVOT Vmax:         81.70 cm/s LVOT Vmean:        59.400 cm/s LVOT VTI:          0.194 m LVOT/AV VTI ratio: 0.77  AORTA Ao Root diam: 3.30 cm Ao Asc diam:  2.60 cm MITRAL VALVE               TRICUSPID VALVE MV Area (PHT): 3.85 cm    TR Peak grad:   14.4 mmHg MV Decel Time: 197 msec    TR Vmax:        190.00 cm/s MV E velocity: 57.40 cm/s  MV A velocity: 90.80 cm/s  SHUNTS MV  E/A ratio:  0.63        Systemic VTI:  0.19 m                            Systemic Diam: 2.00 cm Lyman Bishop MD Electronically signed by Lyman Bishop MD Signature Date/Time: 08/05/2020/12:33:14 PM    Final     Cardiac Studies   Transthoracic Echocardiogram: Date: 08/05/20 Results: No WMA  1. Left ventricular ejection fraction, by estimation, is 60 to 65%. The  left ventricle has normal function. The left ventricle has no regional  wall motion abnormalities. There is moderate asymmetric left ventricular  hypertrophy of the basal-septal  segment. Left ventricular diastolic parameters are consistent with Grade I  diastolic dysfunction (impaired relaxation).  2. Right ventricular systolic function is normal. The right ventricular  size is normal. There is normal pulmonary artery systolic pressure.  3. The mitral valve is normal in structure. No evidence of mitral valve  regurgitation.  4. The aortic valve is tricuspid. Aortic valve regurgitation is not  visualized.  5. The inferior vena cava is normal in size with greater than 50%  respiratory variability, suggesting right atrial pressure of 3 mmHg.   Patient Profile     65 y.o. male  history of R eye blindness, present alcohol, tobacco, and cocaine abuse who presents for atypical pain   Assessment & Plan    Aortic atherosclerosis CAC with CP HTN Poly substance abuse (cocaine, alcohol, and tobacco) - discusses at least temporary cessation to allow gastric ulcer to heal - offered statin and patient again deferred - can increase norvasc as tolerated for BP control long term - will arrange outpatient f/u; if shows up and is cocaine free we will pursue Outpatient CCTA - given need for follow up EGD, we have a high bar for planning coronary intervention.   [facility] will sign off.   Medication Recommendations:  Norvasc 5 mg Po Daily,  Other recommendations (labs, testing, etc):   NA Follow up as an outpatient:  We will arrange outpatient f/u  For questions or updates, please contact [place] Please consult [email or web address] for contact info under Cardiology/STEMI.      Signed, Werner Lean, MD  08/06/2020, 1:01 PM

## 2020-08-06 NOTE — Discharge Instructions (Signed)
Dear [name],   Thank you so much for allowing [place] to be part of your care!  You were admitted to [facility] for a stomach ulcer related to the pain medication meloxicam.  You had an upper endoscopy and were treated with antacid medications to help ulcer healing.  You were found to have prediabetes, which means you are at risk of getting diabetes.   POST-HOSPITAL & CARE INSTRUCTIONS 1. It is important that you take the pantoprazole twice a day to allow the ulcer to heal.  Continue taking this twice a day until instructed by your doctor to do otherwise.  You can take the famotidine and Gaviscon as needed for pain. 2. Avoid taking over-the-counter medications such as ibuprofen, Advil, Aleve, aspirin.  Tylenol is safe to take. 3. Stop using cocaine, alcohol, and smoking.  This can cause your stomach ulcer to get worse and prolong healing. 4. Please let PCP/Specialists know of any changes that were made.  5. Please see medications section of this packet for any medication changes.   DOCTOR'S APPOINTMENT & FOLLOW UP CARE INSTRUCTIONS  Future Appointments  Date Time Provider [place]  09/17/2020 11:15 AM Imogene Burn, PA-C [facility]  05/08/2021 10:50 AM Tomi Likens, Stephan Minister, DO [facility] None    RETURN PRECAUTIONS: Please return to the ED if you start vomiting blood, or find blood in your stools or have black stools.  Take care and be well!  [facility]  [place], [place] [address] [phone]   Please read and follow all provided instructions.  Your diagnoses today include:  1. Precordial chest pain   2. Coronary artery disease involving native heart without angina pectoris, unspecified vessel or lesion type     Tests performed today include:  An EKG of your heart - no sign of heart attack  A chest x-ray  Cardiac enzymes - a blood test for heart muscle damage, no sign  of heart attack  Blood counts and electrolytes  Liver and pancreas function tests - were normal  CT scan of the chest, abdomen, and pelvis - you have blood vessel disease in the heart, but no problems seen that would be causing your chest pain. You need to see the heart doctor listed to check out the blood vessel disease  Vital signs. See below for your results today.   Medications prescribed:   Pepcid (famotidine) - antihistamine  You can find this medication over-the-counter.   DO NOT exceed:   20mg  Pepcid every 12 hours   Omeprazole (Prilosec) - stomach acid reducer  This medication can be found over-the-counter  Take any prescribed medications only as directed.  Follow-up instructions: Please call the heart doctor listed to schedule a follow-up appointment and see your primary care doctor soon as possible.  Return instructions:  SEEK IMMEDIATE MEDICAL ATTENTION IF:  You have severe chest pain, especially if the pain is crushing or pressure-like and spreads to the arms, back, neck, or jaw, or if you have sweating, nausea (feeling sick to your stomach), or shortness of breath. THIS IS AN EMERGENCY. Don't wait to see if the pain will go away. Get medical help at once. Call 911 or [phone] (operator). DO NOT drive yourself to the hospital.   Your chest pain gets worse and does not go away with rest.   You have an attack of chest pain lasting longer than usual, despite rest and  treatment with the medications your caregiver has prescribed.   You wake from sleep with chest pain or shortness of breath.  You feel dizzy or faint.  You have chest pain not typical of your usual pain for which you originally saw your caregiver.   You have any other emergent concerns regarding your health.  Additional Information: Chest pain comes from many different causes. Your caregiver has diagnosed you as having chest pain that is not specific for one problem, but does not require admission.  You  are at low risk for an acute heart condition or other serious illness.   Your vital signs today were: BP (!) 168/120   Pulse 61   Temp 98.2 F (36.8 C) (Oral)   Resp 12   Ht 5\' 5"  (1.651 m)   Wt 67.6 kg   SpO2 98%   BMI 24.80 kg/m  If your blood pressure (BP) was elevated above 135/85 this visit, please have this repeated by your doctor within one month. --------------

## 2020-08-06 NOTE — Op Note (Signed)
[facility] Patient Name: [name] Procedure Date : 08/06/2020 MRN: [id number] Attending MD: Jerene Bears , MD Date of Birth: 10/08/1955 CSN: [id number] Age: 65 Admit Type: Inpatient Procedure:                Upper GI endoscopy Indications:              Epigastric abdominal pain Providers:                Lajuan Lines. Hilarie Fredrickson, MD, Mariana Arn, Carmie End,                            RN, Tyna Jaksch Technician Referring MD:             [facility] Medicines:                Monitored Anesthesia Care Complications:            No immediate complications. Estimated Blood Loss:     Estimated blood loss was minimal. Procedure:                Pre-Anesthesia Assessment:                           - Prior to the procedure, a History and Physical                            was performed, and patient medications and                            allergies were reviewed. The patient's tolerance of                            previous anesthesia was also reviewed. The risks                            and benefits of the procedure and the sedation                            options and risks were discussed with the patient.                            All questions were answered, and informed consent                            was obtained. Prior Anticoagulants: The patient has                            taken no previous anticoagulant or antiplatelet                            agents. ASA Grade Assessment: III - A patient with                            severe systemic disease. After reviewing the risks  and benefits, the patient was deemed in                            satisfactory condition to undergo the procedure.                           After obtaining informed consent, the endoscope was                            passed under direct vision. Throughout the                            procedure, the patient's blood pressure, pulse, and                             oxygen saturations were monitored continuously. The                            GIF-H190 ([id number]) Olympus gastroscope was                            introduced through the mouth, and advanced to the                            second part of duodenum. The upper GI endoscopy was                            accomplished without difficulty. The patient                            tolerated the procedure well. Scope In: Scope Out: Findings:      Normal mucosa was found in the entire esophagus.      A 2 cm hiatal hernia was present.      One non-bleeding cratered gastric ulcer with no stigmata of bleeding was       found in the prepyloric region of the stomach. The lesion was 15 mm in       largest dimension. Biopsies were taken from the ulcer edges with a cold       forceps for histology.      The exam of the stomach was otherwise normal.      Biopsies were taken with a cold forceps in the gastric body, at the       incisura and in the gastric antrum for Helicobacter pylori testing.      Moderate inflammation characterized by erythema was found in the       duodenal bulb.      The second portion of the duodenum was normal. Impression:               - Normal mucosa was found in the entire esophagus.                           - 2 cm hiatal hernia.                           - Non-bleeding gastric ulcer  with no stigmata of                            bleeding. Biopsied. This is the source of upper                            abdominal pain.                           - Duodenitis.                           - Normal second portion of the duodenum.                           - Biopsies were taken with a cold forceps for                            Helicobacter pylori testing. Moderate Sedation:      N/A Recommendation:           - Return patient to hospital ward for ongoing care.                           - Resume and advance diet as tolerated.                           - Continue  present medications.                           - Pantoprazole 40 mg BID-AC x at least 12 weeks,                            then likely once daily.                           - No aspirin, ibuprofen, naproxen, or other                            non-steroidal anti-inflammatory drugs.                           - Stop cocaine and alcohol as these substances will                            prevent ulcer healing and could lead to further                            complications.                           - Await pathology results.                           - Repeat upper endoscopy in 3 months to check  healing. Procedure Code(s):        --- Professional ---                           [phone], Esophagogastroduodenoscopy, flexible,                            transoral; with biopsy, single or multiple Diagnosis Code(s):        --- Professional ---                           K44.9, Diaphragmatic hernia without obstruction or                            gangrene                           K25.9, Gastric ulcer, unspecified as acute or                            chronic, without hemorrhage or perforation                           K29.80, Duodenitis without bleeding                           R10.13, Epigastric pain CPT copyright 2019 [employer]. All rights reserved. The codes documented in this report are preliminary and upon coder review may  be revised to meet current compliance requirements. Jerene Bears, MD 08/06/2020 12:03:05 PM This report has been signed electronically. Number of Addenda: 0

## 2020-08-06 NOTE — Transfer of Care (Signed)
Immediate Anesthesia Transfer of Care Note  Patient: [name]  Procedure(s) Performed: ESOPHAGOGASTRODUODENOSCOPY (EGD) WITH PROPOFOL (N/A ) BIOPSY  Patient Location: Endoscopy Unit  Anesthesia Type:MAC  Level of Consciousness: awake, alert  and oriented  Airway & Oxygen Therapy: Patient Spontanous Breathing and Patient connected to nasal cannula oxygen  Post-op Assessment: Report given to RN and Post -op Vital signs reviewed and stable  Post vital signs: Reviewed and stable  Last Vitals:  Vitals Value Taken Time  BP 116/69   Temp    Pulse 63   Resp    SpO2 100     Last Pain:  Vitals:   08/06/20 1023  TempSrc:   PainSc: 0-No pain      Patients Stated Pain Goal: 0 (02/19/47 6282)  Complications: No complications documented.

## 2020-08-06 NOTE — Progress Notes (Signed)
Pt complaining of chest pain, 8/10- pointing to his heart area and mid sternal area. EKG done- pain was gone by the time the ekg leads were being pulled off. Will hand this off to the on coming RN

## 2020-08-06 NOTE — Progress Notes (Incomplete)
Progress Note  Patient Name: [name] Date of Encounter: 08/06/2020  [facility] Cardiologist: No primary care provider on file. ***  Subjective   Patient   Inpatient Medications    Scheduled Meds: . acetaminophen  650 mg Oral Q6H   Or  . acetaminophen  650 mg Rectal Q6H  . aluminum hydroxide-magnesium carbonate  30 mL Oral TID PC & HS  . amLODipine  5 mg Oral Daily  . divalproex  2,000 mg Oral BID  . enoxaparin (LOVENOX) injection  40 mg Subcutaneous Q24H  . levETIRAcetam  750 mg Oral BID  . pantoprazole  40 mg Oral BID  . phenytoin  50 mg Oral QHS  . phenytoin  100 mg Oral BID   Continuous Infusions:  PRN Meds: famotidine   Vital Signs    Vitals:   08/05/20 1820 08/05/20 2000 08/05/20 2002 08/06/20 0700  BP: 137/87 (!) 148/90  115/80  Pulse:  76  73  Resp:  11  18  Temp:   98.7 F (37.1 C) 98.4 F (36.9 C)  TempSrc:   Oral Oral  SpO2:  96%  97%  Weight:    64 kg  Height:        Intake/Output Summary (Last 24 hours) at 08/06/2020 1009 Last data filed at 08/05/2020 2003 Gross per 24 hour  Intake 480 ml  Output 350 ml  Net 130 ml   Last 3 Weights 08/06/2020 08/04/2020 08/04/2020  Weight (lbs) 141 lb 1.6 oz 140 lb 1.6 oz 149 lb 0.5 oz  Weight (kg) 64.003 kg 63.549 kg 67.6 kg      Telemetry    Sinus rhythm with ventricular rate of 70s, artifacts, no acute events noted - Personally Reviewed  ECG    EKG with NSR, LVH, no acute changes - Personally Reviewed  Physical Exam   GEN: No acute distress.   Neck: No JVD Cardiac: RRR, no murmurs, rubs, or gallops.  Respiratory: Clear to auscultation bilaterally. GI: Soft, nontender, non-distended  MS: No edema; No deformity. Neuro:  Nonfocal  Psych: Normal affect   Labs    High Sensitivity Troponin:   Recent Labs  Lab 08/04/20 0624 08/04/20 0801  TROPONINIHS 5 7      Chemistry Recent Labs  Lab 08/04/20 0624 08/04/20 0801 08/05/20 0229  NA 139  --  139  K 5.1  --  3.9  CL 107  --   106  CO2 20*  --  28  GLUCOSE 88  --  85  BUN 21  --  19  CREATININE 0.90  --  1.09  CALCIUM 8.5*  --  8.9  PROT  --  7.1  --   ALBUMIN  --  3.6  --   AST  --  21  --   ALT  --  17  --   ALKPHOS  --  64  --   BILITOT  --  0.5  --   GFRNONAA >60  --  >60  ANIONGAP 12  --  5     Hematology Recent Labs  Lab 08/04/20 0624 08/05/20 0229 08/06/20 0425  WBC 8.4 8.7 8.7  RBC 4.89 4.57 4.46  HGB 15.1 14.1 13.8  HCT 47.1 42.1 41.6  MCV 96.3 92.1 93.3  MCH 30.9 30.9 30.9  MCHC 32.1 33.5 33.2  RDW 14.4 14.2 14.2  PLT 234 199 164    BNPNo results for input(s): BNP, PROBNP in the last 168 hours.   DDimer No results  for input(s): DDIMER in the last 168 hours.   Radiology    ECHOCARDIOGRAM COMPLETE  Result Date: 08/05/2020    ECHOCARDIOGRAM REPORT   Patient Name:   [name] Rough Date of Exam: 08/05/2020 Medical Rec #:  [id number]     Height:       65.0 in Accession #:    [id number]    Weight:       140.1 lb Date of Birth:  05-03-1955     BSA:          1.700 m Patient Age:    65 years      BP:           140/88 mmHg Patient Gender: M             HR:           55 bpm. Exam Location:  Inpatient Procedure: 2D Echo, Cardiac Doppler and Color Doppler Indications:    Chest pain  History:        Patient has no prior history of Echocardiogram examinations.                 Stroke; Risk Factors:Diabetes.  Sonographer:    Luisa Hart RDCS Referring Phys: [id number] TODD D MCDIARMID  Sonographer Comments: No cardiac surgeries or procedures noted in chart IMPRESSIONS  1. Left ventricular ejection fraction, by estimation, is 60 to 65%. The left ventricle has normal function. The left ventricle has no regional wall motion abnormalities. There is moderate asymmetric left ventricular hypertrophy of the basal-septal segment. Left ventricular diastolic parameters are consistent with Grade I diastolic dysfunction (impaired relaxation).  2. Right ventricular systolic function is normal. The right ventricular size is  normal. There is normal pulmonary artery systolic pressure.  3. The mitral valve is normal in structure. No evidence of mitral valve regurgitation.  4. The aortic valve is tricuspid. Aortic valve regurgitation is not visualized.  5. The inferior vena cava is normal in size with greater than 50% respiratory variability, suggesting right atrial pressure of 3 mmHg. Comparison(s): No prior Echocardiogram. FINDINGS  Left Ventricle: Left ventricular ejection fraction, by estimation, is 60 to 65%. The left ventricle has normal function. The left ventricle has no regional wall motion abnormalities. The left ventricular internal cavity size was normal in size. There is  moderate asymmetric left ventricular hypertrophy of the basal-septal segment. Left ventricular diastolic parameters are consistent with Grade I diastolic dysfunction (impaired relaxation). Indeterminate filling pressures. Right Ventricle: The right ventricular size is normal. No increase in right ventricular wall thickness. Right ventricular systolic function is normal. There is normal pulmonary artery systolic pressure. The tricuspid regurgitant velocity is 1.90 m/s, and  with an assumed right atrial pressure of 3 mmHg, the estimated right ventricular systolic pressure is 06.2 mmHg. Left Atrium: Left atrial size was normal in size. Right Atrium: Right atrial size was normal in size. Pericardium: There is no evidence of pericardial effusion. Mitral Valve: The mitral valve is normal in structure. No evidence of mitral valve regurgitation. Tricuspid Valve: The tricuspid valve is grossly normal. Tricuspid valve regurgitation is trivial. Aortic Valve: The aortic valve is tricuspid. Aortic valve regurgitation is not visualized. Aortic valve mean gradient measures 3.0 mmHg. Aortic valve peak gradient measures 5.1 mmHg. Aortic valve area, by VTI measures 2.42 cm. Pulmonic Valve: The pulmonic valve was normal in structure. Pulmonic valve regurgitation is not  visualized. Aorta: The aortic root and ascending aorta are structurally normal, with no evidence of dilitation. Venous: The  inferior vena cava is normal in size with greater than 50% respiratory variability, suggesting right atrial pressure of 3 mmHg. IAS/Shunts: No atrial level shunt detected by color flow Doppler.  LEFT VENTRICLE PLAX 2D LVIDd:         4.00 cm     Diastology LVIDs:         2.15 cm     LV e' medial:    3.70 cm/s LV PW:         0.85 cm     LV E/e' medial:  15.5 LV IVS:        1.47 cm     LV e' lateral:   7.29 cm/s LVOT diam:     2.00 cm     LV E/e' lateral: 7.9 LV SV:         61 LV SV Index:   36 LVOT Area:     3.14 cm  LV Volumes (MOD) LV vol d, MOD A2C: 48.5 ml LV vol d, MOD A4C: 62.3 ml LV vol s, MOD A2C: 15.5 ml LV vol s, MOD A4C: 27.5 ml LV SV MOD A2C:     33.0 ml LV SV MOD A4C:     62.3 ml LV SV MOD BP:      35.2 ml RIGHT VENTRICLE RV S prime:     11.00 cm/s TAPSE (M-mode): 1.6 cm LEFT ATRIUM             Index       RIGHT ATRIUM          Index LA diam:        3.60 cm 2.12 cm/m  RA Area:     8.17 cm LA Vol (A2C):   44.0 ml 25.88 ml/m RA Volume:   12.10 ml 7.12 ml/m LA Vol (A4C):   25.6 ml 15.06 ml/m LA Biplane Vol: 35.2 ml 20.70 ml/m  AORTIC VALVE                   PULMONIC VALVE AV Area (Vmax):    2.27 cm    PV Vmax:       0.74 m/s AV Area (Vmean):   2.40 cm    PV Vmean:      51.800 cm/s AV Area (VTI):     2.42 cm    PV VTI:        0.156 m AV Vmax:           113.00 cm/s PV Peak grad:  2.2 mmHg AV Vmean:          77.700 cm/s PV Mean grad:  1.0 mmHg AV VTI:            0.252 m AV Peak Grad:      5.1 mmHg AV Mean Grad:      3.0 mmHg LVOT Vmax:         81.70 cm/s LVOT Vmean:        59.400 cm/s LVOT VTI:          0.194 m LVOT/AV VTI ratio: 0.77  AORTA Ao Root diam: 3.30 cm Ao Asc diam:  2.60 cm MITRAL VALVE               TRICUSPID VALVE MV Area (PHT): 3.85 cm    TR Peak grad:   14.4 mmHg MV Decel Time: 197 msec    TR Vmax:        190.00 cm/s MV E velocity: 57.40 cm/s MV A velocity: 90.80  cm/s  SHUNTS MV E/A ratio:  0.63        Systemic VTI:  0.19 m                            Systemic Diam: 2.00 cm Lyman Bishop MD Electronically signed by Lyman Bishop MD Signature Date/Time: 08/05/2020/12:33:14 PM    Final     Cardiac Studies   Echo from 08/05/20:  1. Left ventricular ejection fraction, by estimation, is 60 to 65%. The  left ventricle has normal function. The left ventricle has no regional  wall motion abnormalities. There is moderate asymmetric left ventricular  hypertrophy of the basal-septal  segment. Left ventricular diastolic parameters are consistent with Grade I  diastolic dysfunction (impaired relaxation).  2. Right ventricular systolic function is normal. The right ventricular  size is normal. There is normal pulmonary artery systolic pressure.  3. The mitral valve is normal in structure. No evidence of mitral valve  regurgitation.  4. The aortic valve is tricuspid. Aortic valve regurgitation is not  visualized.  5. The inferior vena cava is normal in size with greater than 50%  respiratory variability, suggesting right atrial pressure of 3 mmHg.   Patient Profile     65 y.o. male with PMH of HTN, tobacco abuse, alcohol use, seizure disorder, substance abuse with cocaine, and right eye blindness, who presented to [facility] for intermittent epigastric and chest pain. Cardiology consulted for chest pain and coronary calcification on CTA.    Assessment & Plan    Atypical chest pain Coronary calcification on CTA - patient [facility] to [facility] 08/04/20 with intermittent epigastric and chest pain, pain radiates to left, middle, and right side of chest, abdomen, and back, without diaphoresis or SOB, worsened with certain position, deep breathing, and palpation. Hs Trop negative x2. EKG without ischemic finding. Echo from 08/05/20 showed EF 60-65%. LV normal function, no RWMA, moderate asymmetric LVH of the basal-septal  Segment. Grade I DD. RV systolic function normal. No  significant valvular disease. CTA torso 08/04/20 showed no evidence of acute aortic syndrome, severe proximal left anterior descending and left circumflex coronary arterial atherosclerotic calcifications, and aortic atherosclerosis. Chest pain is not remarkable for ischemic process at this time, possible referred from epigastric pain.  - discussed the importance of cessation of cocaine use, may obtain outpatient CCTA if able to remain off cocaine  - risk factor stratification: A1C 5.7%, LDL 87 from 08/05/20, lifestyle modification encouraged , patient does not wish statin, consider ASA 81mg  daily for prophylaxis if no contraindication from GI standpoint  - agree with GI evaluation for GI dominant symptoms   Epigastric pain - EGD planned today, management per primary and GI   HTN - BP elevated this admission, recommend low dose amlodipine 5mg  daily   Substance abuse with cocaine  - continue use coasine over the past few days before this admission - UDS positive for opiates and cocaine this admission - recommend cessation  - avoid BBlocker in the setting of active cocaine use   Hepatic hemangioma  - Per CTA torso, outpatient follow up with PCP  Pre-diabetes - AC1 5.7%, discussed lifestyle modification   Seizure disorder - on home regimen of AEDs    Arranged outpatient follow up appointment with cardiology on 09/17/20 at 11:15 AM.    For questions or updates, please contact [facility] Please consult [email or web address] for contact info under        Signed, Margie Billet, NP  08/06/2020,  10:09 AM

## 2020-08-07 ENCOUNTER — Encounter (HOSPITAL_COMMUNITY): Payer: Self-pay | Admitting: Internal Medicine

## 2020-08-08 ENCOUNTER — Telehealth: Payer: Self-pay | Admitting: Neurology

## 2020-08-08 LAB — SURGICAL PATHOLOGY

## 2020-08-08 NOTE — Telephone Encounter (Signed)
Patient called in stating he had been in the ED on 08/04/20 and discharged on 08/06/20. He stated he was supposed to let Dr. Tomi Aguilar know. His next appointment is scheduled for 05/08/21.

## 2020-08-08 NOTE — Telephone Encounter (Signed)
Pt advised.

## 2020-08-08 NOTE — Telephone Encounter (Signed)
Hospital notes reviewed.  Other than what was discussed by his caring providers in the hospital, I have no further recommendations from my standpoint.

## 2020-08-09 NOTE — Anesthesia Postprocedure Evaluation (Signed)
Anesthesia Post Note  Patient: [name]  Procedure(s) Performed: ESOPHAGOGASTRODUODENOSCOPY (EGD) WITH PROPOFOL (N/A ) BIOPSY     Patient location during evaluation: Endoscopy Anesthesia Type: MAC Level of consciousness: awake and alert Pain management: pain level controlled Vital Signs Assessment: post-procedure vital signs reviewed and stable Respiratory status: spontaneous breathing, nonlabored ventilation, respiratory function stable and patient connected to nasal cannula oxygen Cardiovascular status: stable and blood pressure returned to baseline Postop Assessment: no apparent nausea or vomiting Anesthetic complications: no   No complications documented.  Last Vitals:  Vitals:   08/06/20 1219 08/06/20 1248  BP: (!) 166/92 (!) 150/94  Pulse: 64 (!) 58  Resp: 17 12  Temp:  36.8 C  SpO2: 99%     Last Pain:  Vitals:   08/06/20 1248  TempSrc: Oral  PainSc:                  Nikkia Devoss

## 2020-08-12 ENCOUNTER — Encounter: Payer: Self-pay | Admitting: Family Medicine

## 2020-08-13 ENCOUNTER — Other Ambulatory Visit: Payer: Self-pay

## 2020-08-13 MED ORDER — BIS SUBCIT-METRONID-TETRACYC 140-125-125 MG PO CAPS: 3.0000 | ORAL_CAPSULE | Freq: Three times a day (TID) | ORAL | 0 refills | Status: DC

## 2020-08-22 ENCOUNTER — Ambulatory Visit: Payer: 59 | Admitting: Cardiology

## 2020-08-26 ENCOUNTER — Ambulatory Visit: Payer: 59 | Admitting: Internal Medicine

## 2020-09-10 ENCOUNTER — Ambulatory Visit (INDEPENDENT_AMBULATORY_CARE_PROVIDER_SITE_OTHER): Payer: Medicare HMO | Admitting: Gastroenterology

## 2020-09-10 ENCOUNTER — Encounter: Payer: Self-pay | Admitting: Gastroenterology

## 2020-09-10 VITALS — BP 108/70 | HR 71 | Ht 65.0 in | Wt 139.0 lb

## 2020-09-10 DIAGNOSIS — B9681 Helicobacter pylori [H. pylori] as the cause of diseases classified elsewhere: Secondary | ICD-10-CM | POA: Diagnosis not present

## 2020-09-10 DIAGNOSIS — K257 Chronic gastric ulcer without hemorrhage or perforation: Secondary | ICD-10-CM

## 2020-09-10 DIAGNOSIS — A048 Other specified bacterial intestinal infections: Secondary | ICD-10-CM

## 2020-09-10 MED ORDER — PANTOPRAZOLE SODIUM 40 MG PO TBEC: 40.0000 mg | DELAYED_RELEASE_TABLET | Freq: Two times a day (BID) | ORAL | 1 refills | Status: DC

## 2020-09-10 NOTE — Progress Notes (Signed)
[facility] Progress Note  Chief Complaint: Gastric ulcer  Subjective  History: I saw this patient for screening colonoscopy in 2019.  He was admitted to the hospital about 5 weeks ago with chest pain and epigastric pain in the setting of alcohol and cocaine use.  Cardiology consult note reviewed, pain was felt to be atypical, troponin was negative, EKG reassuring, CTA chest had coronary calcifications. Dr. Vena Rua GI consult note was reviewed.  Upper endoscopy report shows 15 to 20 mm clean-based prepyloric gastric ulcer.  Biopsies negative for malignancy, but positive for H. pylori.  Dr. Hilarie Fredrickson prescribed bismuth based quadruple therapy, the patient was contacted about this on April 19.  It was difficult to get a clear and consistent history from this patient today, and he has poor health literacy.  When I asked about the antibiotic treatment for his H. pylori, it sounds like he has not quite finished it.  He then told me he had put it in a drawer and missed some doses, now has about 2 tablets left.  Therefore, it is clear he has not been compliant with that therapy.  I asked if he was taking the pantoprazole as prescribed at hospital discharge, but did not know what that medicine was and did not listed with his usual meds. His epigastric pain resolved, he denies nausea or vomiting, he denies black or bloody stool.  He is evasive about whether or not there is ongoing alcohol or drug use.  ROS: Cardiovascular:  no chest pain Respiratory: no dyspnea  The patient's Past Medical, Family and Social History were reviewed and are on file in the EMR.  Objective:  Med list reviewed  Current Outpatient Medications:  .  amLODipine (NORVASC) 5 MG tablet, Take 1 tablet (5 mg total) by mouth daily., Disp: 30 tablet, Rfl: 0 .  bismuth-metronidazole-tetracycline (PYLERA) 140-125-125 MG capsule, Take 3 capsules by mouth 4 (four) times daily -  before meals and at bedtime., Disp: 120 capsule,  Rfl: 0 .  Cholecalciferol (VITAMIN D) 50 MCG (2000 UT) tablet, Take 2,000 Units by mouth daily., Disp: , Rfl:  .  cholecalciferol (VITAMIN D3) 25 MCG (1000 UNIT) tablet, Take 1,000 Units by mouth daily., Disp: , Rfl:  .  divalproex (DEPAKOTE ER) 500 MG 24 hr tablet, Take 4 tablets twice daily, Disp: 240 tablet, Rfl: 5 .  famotidine (PEPCID) 20 MG tablet, Take 1 tablet (20 mg total) by mouth 2 (two) times daily as needed for heartburn or indigestion., Disp: 30 tablet, Rfl: 0 .  levETIRAcetam (KEPPRA) 750 MG tablet, Take 1 tablet (750 mg total) by mouth 2 (two) times daily., Disp: 60 tablet, Rfl: 5 .  phenytoin (DILANTIN) 100 MG ER capsule, Take 2 capsules (200 mg total) by mouth 2 (two) times daily., Disp: , Rfl:  .  phenytoin (PHENYTOIN INFATABS) 50 MG tablet, CHEW 1 TABLET (50 MG TOTAL) BY MOUTH AT BEDTIME., Disp: 30 tablet, Rfl: 5 .  pantoprazole (PROTONIX) 40 MG tablet, Take 1 tablet (40 mg total) by mouth 2 (two) times daily., Disp: 60 tablet, Rfl: 1   Vital signs in last 24 hrs: Vitals:   09/10/20 1523  BP: 108/70  Pulse: 71  SpO2: 96%   Wt Readings from Last 3 Encounters:  09/10/20 139 lb (63 kg)  08/06/20 141 lb 1.6 oz (64 kg)  05/02/20 149 lb (67.6 kg)    Physical Exam    HEENT: sclera anicteric, oral mucosa moist without lesions  Neck: supple, no thyromegaly,  JVD or lymphadenopathy  Cardiac: RRR without murmurs, S1S2 heard, no peripheral edema.  Good peripheral pulses  Pulm: clear to auscultation bilaterally, normal RR and effort noted  Abdomen: soft, no tenderness, with active bowel sounds. No guarding or palpable hepatosplenomegaly.  Skin; warm and dry, no jaundice or rash  Labs:  CBC Latest Ref Rng & Units 08/06/2020 08/05/2020 08/04/2020  WBC 4.0 - 10.5 K/uL 8.7 8.7 8.4  Hemoglobin 13.0 - 17.0 g/dL 13.8 14.1 15.1  Hematocrit 39.0 - 52.0 % 41.6 42.1 47.1  Platelets 150 - 400 K/uL 164 199 234   CMP Latest Ref Rng & Units 08/05/2020 08/04/2020 05/02/2020  Glucose 70  - 99 mg/dL 85 88 75  BUN 8 - 23 mg/dL 19 21 28(H)  Creatinine 0.61 - 1.24 mg/dL 1.09 0.90 1.07  Sodium 135 - 145 mmol/L 139 139 143  Potassium 3.5 - 5.1 mmol/L 3.9 5.1 4.4  Chloride 98 - 111 mmol/L 106 107 108  CO2 22 - 32 mmol/L 28 20(L) 30  Calcium 8.9 - 10.3 mg/dL 8.9 8.5(L) 9.5  Total Protein 6.5 - 8.1 g/dL - 7.1 7.6  Total Bilirubin 0.3 - 1.2 mg/dL - 0.5 0.4  Alkaline Phos 38 - 126 U/L - 64 58  AST 15 - 41 U/L - 21 25  ALT 0 - 44 U/L - 17 34    ___________________________________________ Radiologic studies:  CLINICAL DATA:  65 year old male with chest pain.   EXAM: CT ANGIOGRAPHY CHEST, ABDOMEN AND PELVIS   TECHNIQUE: Non-contrast CT of the chest was initially obtained.   Multidetector CT imaging through the chest, abdomen and pelvis was performed using the standard protocol during bolus administration of intravenous contrast. Multiplanar reconstructed images and MIPs were obtained and reviewed to evaluate the vascular anatomy.   CONTRAST:  175mL OMNIPAQUE IOHEXOL 350 MG/ML SOLN   COMPARISON:  CT abdomen pelvis from 09/20/2019   FINDINGS: CTA CHEST FINDINGS   VASCULAR   Preferential opacification of the thoracic aorta. No evidence of thoracic aortic aneurysm or dissection. Normal heart size. No pericardial effusion.   Sinues of Valsalva: 26 mm 26 x 29 mm   Sinotubular Junction: 26 mm   Ascending Aorta: 30 mm   Aortic Arch: 28 mm   Descending aorta: 24 mm at the level of the carina   Branch vessels: Conventional branching pattern. No significant atherosclerotic changes.   Coronary arteries: Normal origins and courses. Severe focal atherosclerotic calcification straddling the bifurcation of the left main coronary artery, extending into the left anterior descending and left circumflex coronary arteries.   Main pulmonary artery: 26 mm. No evidence of central pulmonary embolism.   Pulmonary veins: No anomalous pulmonary venous return. No evidence of  left atrial appendage thrombus.   Review of the MIP images confirms the above findings.   NON VASCULAR   Mediastinum/Nodes: No enlarged mediastinal, hilar, or axillary lymph nodes. Thyroid gland, trachea, and esophagus demonstrate no significant findings.   Lungs/Pleura: Lungs are clear. No pleural effusion or pneumothorax.   Musculoskeletal: No chest wall abnormality. No acute or significant osseous findings.   CTA ABDOMEN AND PELVIS FINDINGS   VASCULAR   Aorta: Patent and normal caliber throughout. Scattered fibrofatty and calcified atherosclerotic changes, most prominent in the distal aorta.   Celiac: Patent without evidence of aneurysm, dissection, vasculitis or significant stenosis.   SMA: Patent without evidence of aneurysm, dissection, vasculitis or significant stenosis.   Renals: Single bilateral renal arteries are patent without evidence of aneurysm, dissection, vasculitis, fibromuscular dysplasia or  significant stenosis.   IMA: Patent without evidence of aneurysm, dissection, vasculitis or significant stenosis.   Inflow: Circumferential atherosclerotic calcifications. Patent without evidence of aneurysm, dissection, vasculitis or significant stenosis.   Veins: No obvious venous abnormality within the limitations of this arterial phase study.   Review of the MIP images confirms the above findings.   NON-VASCULAR   Hepatobiliary: 8 mm hyperattenuating focus in the medial aspect of segment 2, mildly hypoattenuating on noncontrast phase. No additional focal hepatic abnormality. The gallbladder is present with multiple peripherally calcified infundibular gallstones. No pericholecystic fluid or gallbladder wall thickening. No intra or extrahepatic biliary ductal dilation.   Pancreas: Unremarkable. No pancreatic ductal dilatation or surrounding inflammatory changes.   Spleen: Normal in size without focal abnormality.   Adrenals/Urinary Tract: Adrenal  glands are unremarkable. Kidneys are normal, without renal calculi, focal lesion, or hydronephrosis. Bladder is unremarkable.   Stomach/Bowel: Stomach is within normal limits. Appendix appears normal. Scattered colonic diverticula, most pronounced in the descending colon without surrounding inflammatory changes. No evidence of bowel wall thickening, distention, or inflammatory changes.   Lymphatic: No abdominopelvic lymphadenopathy.   Reproductive: Prostate is unremarkable.   Other: No abdominal wall hernia or abnormality. No abdominopelvic ascites.   Musculoskeletal: No acute or significant osseous findings.   IMPRESSION: VASCULAR   1. No evidence of acute aortic syndrome. 2. Severe proximal left anterior descending and left circumflex coronary arterial atherosclerotic calcifications. 3.  Aortic Atherosclerosis (ICD10-I70.0).   NON VASCULAR   1. Hyperattenuating subcentimeter focus in the left lobe of the liver, most compatible flash filling hepatic hemangioma. 2. Diverticulosis, no evidence of diverticulitis.   Ruthann Cancer, MD   [facility]   [facility]     Electronically Signed   By: Ruthann Cancer MD   On: 08/04/2020 10:01  ____________________________________________ Other:  1. Left ventricular ejection fraction, by estimation, is 60 to 65%. The  left ventricle has normal function. The left ventricle has no regional  wall motion abnormalities. There is moderate asymmetric left ventricular  hypertrophy of the basal-septal  segment. Left ventricular diastolic parameters are consistent with Grade I  diastolic dysfunction (impaired relaxation).   2. Right ventricular systolic function is normal. The right ventricular  size is normal. There is normal pulmonary artery systolic pressure.   3. The mitral valve is normal in structure. No evidence of mitral valve  regurgitation.   4. The aortic valve is tricuspid.  Aortic valve regurgitation is not  visualized.   5. The inferior vena cava is normal in size with greater than 50%  respiratory variability, suggesting right atrial pressure of 3 mmHg.   _____________________________________________ Assessment & Plan  Assessment: Encounter Diagnoses  Name Primary?  . Chronic gastric ulcer due to Helicobacter pylori Yes  . H. pylori infection    Gastric ulcer from H. pylori, treated with bismuth based quadruple therapy, but it seems he has not been entirely adherent to that regimen.  This decreases its chance of eradicating the bacteria.  This patient has ongoing issues with poor health literacy and medical noncompliance.  It is difficult to be certain, but he might not be taking PPI at present.  It sounds like he had some follow-up with the primary care clinic afterwards but those records are not available. Plan: Pantoprazole 40 mg twice daily Upper endoscopy scheduled in 5 to 6 weeks.  This will assess if ulcer is healed, biopsies will be taken to see if H. pylori eradicated or still present. He  was agreeable to endoscopy after discussion of procedure and risks.  The benefits and risks of the planned procedure were described in detail with the patient or (when appropriate) their health care proxy.  Risks were outlined as including, but not limited to, bleeding, infection, perforation, adverse medication reaction leading to cardiac or pulmonary decompensation, pancreatitis (if ERCP).  The limitation of incomplete mucosal visualization was also discussed.  No guarantees or warranties were given.  He was initially uncertain that he could get a care partner, then said he had a friend that he knew would do it for him.  He has likely to take the [employer] bus for the procedure, but still needs a care partner to do so.  40 minutes were spent on this encounter (including chart review, history/exam, counseling/coordination of care, and documentation) > 50% of that time  was spent on counseling and coordination of care.  Extensive chart review required for recent hospitalization since I am not seen the patient since 2019.  Topics discussed included: Stomach ulcer, H. pylori bacteria, his other medical problems and need for adherence to treatment plan.Marland Kitchen  Nelida Meuse III

## 2020-09-10 NOTE — Progress Notes (Deleted)
Cardiology Office Note    Date:  09/10/2020   ID:  [name], DOB Jan 08, 1956, MRN [id number]   PCP:  Burman Riis, NP   [facility]rez  Cardiologist:  None *** Advanced Practice Provider:  No care team member to display Electrophysiologist:  None   [id number]}   No chief complaint on file.   History of Present Illness:  [name] is a 65 y.o. male ***    Past Medical History:  Diagnosis Date  . Acute gastric ulcer without hemorrhage or perforation   . Atherosclerosis of aorta ([place]) 08/05/2020  . Blindness of right eye   . Cocaine abuse ([place])   . Coronary artery calcification seen on CAT scan 08/05/2020   CTA Chest & AP 08/04/20: Severe proximal left anterior descending and left circumflex coronary arterial atherosclerotic calcifications  . Diabetes mellitus   . Epilepsy ([place])    As reported by patient  . Gastric ulcer due to Helicobacter pylori 0/16/0109    STOMACH, ANTRUM AND BODY, BIOPSY:  Gastric antral and oxyntic mucosa with Helicobacter pylori-associated gastritis (confirmed with Warthin Starry stain)   . Hypertension 01/24/2017  . Localization-related symptomatic epilepsy and epileptic syndromes with complex partial seizures, not intractable, without status epilepticus ([place]) 01/29/2015  . LUNG ABSCESS 03/14/2008   Qualifier: Diagnosis of  By: Melvyn Novas MD, Christena Deem   . LVH (left ventricular hypertrophy) 08/05/2020   transthoracic echocardiogram 08/05/20: moderate asymmetric left ventricular hypertrophy of the basal-septal segment. Left ventricular diastolic parameters are consistent with Grade I diastolic dysfunction   . Multiple gallstones 08/05/2020   CTA 08/04/20: multiple peripherally calcified infundibular gallstones  . Overdose 01/24/2017  . Sciatica of right side 04/30/2016  . Seizures ([place])    since age 64;last seizure 3-4 years ago per pt  . Stroke [facility]) 2 years ago  . Tobacco dependence with current use 08/05/2020    Past  Surgical History:  Procedure Laterality Date  . BIOPSY  08/06/2020   Procedure: BIOPSY;  Surgeon: Jerene Bears, MD;  Location: [facility] ENDOSCOPY;  Service: Gastroenterology;;  . ESOPHAGOGASTRODUODENOSCOPY (EGD) WITH PROPOFOL N/A 08/06/2020   Procedure: ESOPHAGOGASTRODUODENOSCOPY (EGD) WITH PROPOFOL;  Surgeon: Jerene Bears, MD;  Location: [facility];  Service: Gastroenterology;  Laterality: N/A;  . NO PAST SURGERIES      Current Medications: No outpatient medications have been marked as taking for the 09/17/20 encounter (Appointment) with Imogene Burn, PA-C.     Allergies:   Mobic [meloxicam]   Social History   Socioeconomic History  . Marital status: Single    Spouse name: Not on file  . Number of children: Not on file  . Years of education: Not on file  . Highest education level: Not on file  Occupational History  . Not on file  Tobacco Use  . Smoking status: Current Every Day Smoker    Packs/day: 0.25    Types: Cigarettes  . Smokeless tobacco: Never Used  Vaping Use  . Vaping Use: Never used  Substance and Sexual Activity  . Alcohol use: Yes    Comment: one 40 oz beer each day  . Drug use: Yes    Types: Cocaine    Comment: cocaine--weekly per pt; last time couple days ago  . Sexual activity: Never  Other Topics Concern  . Not on file  Social History Narrative   Right handed   One story home   Negative caffeine   Social Determinants of Health   Financial  Resource Strain: Not on file  Food Insecurity: Not on file  Transportation Needs: Not on file  Physical Activity: Not on file  Stress: Not on file  Social Connections: Not on file     Family History:  The patient's ***family history includes Diabetes in his mother; Hypertension in his mother.   ROS:   Please see the history of present illness.    ROS All other systems reviewed and are negative.   PHYSICAL EXAM:   VS:  There were no vitals taken for this visit.  Physical Exam  GEN: Well nourished,  well developed, in no acute distress  HEENT: normal  Neck: no JVD, carotid bruits, or masses Cardiac:RRR; no murmurs, rubs, or gallops  Respiratory:  clear to auscultation bilaterally, normal work of breathing GI: soft, nontender, nondistended, + BS Ext: without cyanosis, clubbing, or edema, Good distal pulses bilaterally MS: no deformity or atrophy  Skin: warm and dry, no rash Neuro:  Alert and Oriented x 3, Strength and sensation are intact Psych: euthymic mood, full affect  Wt Readings from Last 3 Encounters:  08/06/20 141 lb 1.6 oz (64 kg)  05/02/20 149 lb (67.6 kg)  02/29/20 145 lb (65.8 kg)      Studies/Labs Reviewed:   EKG:  EKG is*** ordered today.  The ekg ordered today demonstrates ***  Recent Labs: 08/04/2020: ALT 17 08/05/2020: BUN 19; Creatinine, Ser 1.09; Potassium 3.9; Sodium 139 08/06/2020: Hemoglobin 13.8; Platelets 164; TSH 0.984   Lipid Panel    Component Value Date/Time   CHOL 171 08/05/2020 0229   TRIG 110 08/05/2020 0229   HDL 62 08/05/2020 0229   CHOLHDL 2.8 08/05/2020 0229   VLDL 22 08/05/2020 0229   LDLCALC 87 08/05/2020 0229    Additional studies/ records that were reviewed today include:  ***   Risk Assessment/Calculations:   {Does this patient have ATRIAL FIBRILLATION?:[phone]}     ASSESSMENT:    No diagnosis found.   PLAN:  In order of problems listed above:    Shared Decision Making/Informed Consent   {Are you ordering a CV Procedure (e.g. stress test, cath, DCCV, TEE, etc)?   Press F2        :[id number]}    Medication Adjustments/Labs and Tests Ordered: Current medicines are reviewed at length with the patient today.  Concerns regarding medicines are outlined above.  Medication changes, Labs and Tests ordered today are listed in the Patient Instructions below. There are no Patient Instructions on file for this visit.   Sumner Boast, PA-C  09/10/2020 3:09 PM    [facility] [address], [place], [place]  [address] Phone: [phone]; Fax: [phone]

## 2020-09-10 NOTE — Patient Instructions (Signed)
If you are age 65 or older, your body mass index should be between 23-30. Your Body mass index is 23.13 kg/m. If this is out of the aforementioned range listed, please consider follow up with your Primary Care Provider.  If you are age 4 or younger, your body mass index should be between 19-25. Your Body mass index is 23.13 kg/m. If this is out of the aformentioned range listed, please consider follow up with your Primary Care Provider.   You have been scheduled for an endoscopy. Please follow written instructions given to you at your visit today. If you use inhalers (even only as needed), please bring them with you on the day of your procedure.  It was a pleasure to see you today!  Thank you for trusting me with your gastrointestinal care!

## 2020-09-17 ENCOUNTER — Ambulatory Visit: Payer: Medicare HMO | Admitting: Physician Assistant

## 2020-09-25 ENCOUNTER — Other Ambulatory Visit: Payer: Self-pay

## 2020-09-25 ENCOUNTER — Encounter (INDEPENDENT_AMBULATORY_CARE_PROVIDER_SITE_OTHER): Payer: Self-pay | Admitting: Primary Care

## 2020-09-25 ENCOUNTER — Ambulatory Visit (INDEPENDENT_AMBULATORY_CARE_PROVIDER_SITE_OTHER): Payer: Medicare HMO | Admitting: Primary Care

## 2020-09-25 VITALS — BP 146/92 | HR 53 | Temp 97.3°F | Ht 65.0 in | Wt 145.4 lb

## 2020-09-25 DIAGNOSIS — F17210 Nicotine dependence, cigarettes, uncomplicated: Secondary | ICD-10-CM

## 2020-09-25 DIAGNOSIS — Z13220 Encounter for screening for lipoid disorders: Secondary | ICD-10-CM

## 2020-09-25 DIAGNOSIS — F1410 Cocaine abuse, uncomplicated: Secondary | ICD-10-CM | POA: Diagnosis not present

## 2020-09-25 DIAGNOSIS — F17200 Nicotine dependence, unspecified, uncomplicated: Secondary | ICD-10-CM | POA: Diagnosis not present

## 2020-09-25 DIAGNOSIS — B9681 Helicobacter pylori [H. pylori] as the cause of diseases classified elsewhere: Secondary | ICD-10-CM

## 2020-09-25 DIAGNOSIS — I10 Essential (primary) hypertension: Secondary | ICD-10-CM

## 2020-09-25 DIAGNOSIS — Z09 Encounter for follow-up examination after completed treatment for conditions other than malignant neoplasm: Secondary | ICD-10-CM

## 2020-09-25 DIAGNOSIS — K257 Chronic gastric ulcer without hemorrhage or perforation: Secondary | ICD-10-CM | POA: Diagnosis not present

## 2020-09-25 DIAGNOSIS — Z7689 Persons encountering health services in other specified circumstances: Secondary | ICD-10-CM

## 2020-09-25 MED ORDER — AMLODIPINE BESYLATE 10 MG PO TABS: 10.0000 mg | ORAL_TABLET | Freq: Every day | ORAL | 1 refills | Status: DC

## 2020-09-25 NOTE — Patient Instructions (Addendum)
Community Resources  Advocacy/Legal Legal Aid [place]:  [phone]  /  [phone]  [place]:  [phone]  Family Service of the [facility] 24-hr Crisis line:  [phone]  [facility], [place]:  [phone]  [place] (custody):  [phone]  [employer] [facility]:   [phone]    Baby & Breastfeeding Car Seat Inspection @ Various [place].- call [place] [facility]  [phone]  [place] Lactation [phone]  [place]: [phone] ([place]);  [phone] ([place])  [place] Switch League:  [phone]   [place] Child Development: [phone] [facility]) / [phone] ([place])  - Child Care Resources/ Referrals/ Scholarships  - Head Start/ Early Head Start (call or apply online)  [place] DHHS: [place] Pre-K :  [phone] / [phone]   Employment / [place]: [phone] / [place] ([place]): [phone] ([place]) / [phone] ([place])  [place]: [phone] / [phone]  [place] [employer] [facility] Career Center: [phone]  DHHS Work First: [phone] ([place]) / [phone] ([place])  [place]:  [place]:  [phone]  [employer]: [place] (furniture):  [place] Helping Hands: [phone]  [place]  [place]- SNAP/ Food Stamps: [phone]  [place]: Letta [phone] ;  [place] [phone]  [place]  During the summer, text "FOOD" to [place] / Clinics (Adults) [place] (for Adults) through [facility]: [phone]  [place]:   [place]:   [phone]  Health Department:  [place]:  [phone] / [phone]  Planned [facility] of [facility] [place]:   [phone]  [facility]:   [phone] x [place]:   [place]:  [place]:  [phone]   [place] for [facility]):  [phone]  [employer] [facility]:  [place]:  [place]:  [place]:  [place]  www.youthsafegso.org  PFLAG  665-993-5701 / info@[place] .org  The [place]:  [phone]   Mental Health/ Substance Use Family Service of the [place]  [place]:  [phone] or [phone]  [facility] of Care:  [phone]  [facility]neys Counseling:  [place]:  [phone]    [place]:  [phone]  Alcoholics Anonymous:  [phone]  Narcotics Anonymous:  [phone]  Quit Smoking Hotline:  800-QUIT-NOW 640-237-1472)   Parenting [place]:  [place]:  [phone]  YWCA: [phone]  [employer]: Bringing Out the Best:  [phone]               Thriving at Three (Hispanic families): [phone]  Healthy Start ([place]):  [phone]  Parents as Teachers:  [place] Together (Immigrants): [phone]   Poison Control [phone]  [place] Open Doors Application: [address]  [place] of [place]: http://www.[place].gov/index.aspx?page=3615   Special Needs Family Support Network:  [phone]  [place] of [place]:   [phone] or [phone] /  [place]:  [place]:  [phone]  Children's  Developmental Service Agency (CDSA):  [phone]  [facility] (Care Coordination for Children):  [phone]   Transportation Medicaid Transportation: [phone] to apply  [place]: [phone] (reduced-fare bus ID to [place])  SCAT Paratransit services: Eligible riders only, call [phone] for application   Tutoring/Mentoring [place]: [place]: [phone] ([place])  ACES through child's school: [place]: contact your local [place] Program: [phone]

## 2020-09-25 NOTE — Progress Notes (Signed)
[facility]   Subjective:     [name] is a 65 y.o. male presents for hospital follow up and establish care. Admit date to the hospital was 08/04/20, patient was discharged from the hospital on 08/06/20, patient was admitted for: Localization-related symptomatic epilepsy and epileptic syndromes with complex partial seizures, not intractable, without status epilepticus ([place]), Hypertension and Chest pain. Presented to the emergency room with 3 days of intermittent epigastric/chest pain.EGD was performed on 4/12 with findings of 15 mm nonbleeding gastric ulcer and duodenitis referred to gastrology. Bp is elevated. Denies shortness of breath, headaches, chest pain or lower extremity edema, sudden onset, vision changes, unilateral weakness, dizziness, paresthesias Past Medical History:  Diagnosis Date  . Acute gastric ulcer without hemorrhage or perforation   . Atherosclerosis of aorta ([place]) 08/05/2020  . Blindness of right eye   . Cocaine abuse ([place])   . Coronary artery calcification seen on CAT scan 08/05/2020   CTA Chest & AP 08/04/20: Severe proximal left anterior descending and left circumflex coronary arterial atherosclerotic calcifications  . Diabetes mellitus   . Epilepsy ([place])    As reported by patient  . Gastric ulcer due to Helicobacter pylori 4/33/2951    STOMACH, ANTRUM AND BODY, BIOPSY:  Gastric antral and oxyntic mucosa with Helicobacter pylori-associated gastritis (confirmed with Warthin Starry stain)   . Hypertension 01/24/2017  . Localization-related symptomatic epilepsy and epileptic syndromes with complex partial seizures, not intractable, without status epilepticus ([place]) 01/29/2015  . LUNG ABSCESS 03/14/2008   Qualifier: Diagnosis of  By: Melvyn Novas MD, Christena Deem   . LVH (left ventricular hypertrophy) 08/05/2020   transthoracic echocardiogram 08/05/20: moderate asymmetric left ventricular hypertrophy of the basal-septal segment. Left ventricular diastolic parameters  are consistent with Grade I diastolic dysfunction   . Multiple gallstones 08/05/2020   CTA 08/04/20: multiple peripherally calcified infundibular gallstones  . Overdose 01/24/2017  . Sciatica of right side 04/30/2016  . Seizures ([place])    since age 57;last seizure 3-4 years ago per pt  . Stroke [facility]) 2 years ago  . Tobacco dependence with current use 08/05/2020     Allergies  Allergen Reactions  . Mobic [Meloxicam] Nausea Only    "pain all over"      Current Outpatient Medications on File Prior to Visit  Medication Sig Dispense Refill  . amLODipine (NORVASC) 5 MG tablet Take 1 tablet (5 mg total) by mouth daily. 30 tablet 0  . Cholecalciferol (VITAMIN D) 50 MCG (2000 UT) tablet Take 2,000 Units by mouth daily.    . divalproex (DEPAKOTE ER) 500 MG 24 hr tablet Take 4 tablets twice daily 240 tablet 5  . levETIRAcetam (KEPPRA) 750 MG tablet Take 1 tablet (750 mg total) by mouth 2 (two) times daily. 60 tablet 5  . pantoprazole (PROTONIX) 40 MG tablet Take 1 tablet (40 mg total) by mouth 2 (two) times daily. 60 tablet 1  . phenytoin (DILANTIN) 100 MG ER capsule Take 2 capsules (200 mg total) by mouth 2 (two) times daily.    . phenytoin (PHENYTOIN INFATABS) 50 MG tablet CHEW 1 TABLET (50 MG TOTAL) BY MOUTH AT BEDTIME. 30 tablet 5  . bismuth-metronidazole-tetracycline (PYLERA) 140-125-125 MG capsule Take 3 capsules by mouth 4 (four) times daily -  before meals and at bedtime. 120 capsule 0  . famotidine (PEPCID) 20 MG tablet Take 1 tablet (20 mg total) by mouth 2 (two) times daily as needed for heartburn or indigestion. 30 tablet 0   No current facility-administered medications  on file prior to visit.     Review of System: Review of Systems  All other systems reviewed and are negative.   Objective:  BP (!) 146/92 (BP Location: Right Arm, Patient Position: Sitting, Cuff Size: Normal)   Pulse (!) 53   Temp (!) 97.3 F (36.3 C) (Temporal)   Ht 5\' 5"  (1.651 m)   Wt 145 lb 6.4 oz (66 kg)    SpO2 97%   BMI 24.20 kg/m  Physical Exam: General Appearance: Well nourished, in no apparent distress. Eyes: PERRLA, EOMs, conjunctiva no swelling or erythema( right eye blind no PERRLA?EOM) Sinuses: No Frontal/maxillary tenderness ENT/Mouth: Ext aud canals clear, TMs without erythema, bulging. No erythema, swelling, or exudate on post pharynx. Hearing normal.  Neck: Supple, thyroid normal.  Respiratory: Respiratory effort normal, BS equal bilaterally without rales, rhonchi, wheezing or stridor.  Cardio: RRR with no MRGs. Brisk peripheral pulses without edema.  Abdomen: Soft, + BS. tender, no guarding, rebound, hernias, masses. Lymphatics: Non tender without lymphadenopathy.  Musculoskeletal: Full ROM, 5/5 strength, normal gait.  Skin: Warm, dry without rashes, lesions, ecchymosis.  Neuro: Cranial nerves intact. Normal muscle tone, no cerebellar symptoms. Sensation intact.  Psych: Awake and oriented X 3, normal affect, Insight and Judgment appropriate.    Assessment:  [name] was seen today for hospitalization follow-up.  Diagnoses and all orders for this visit:  Tobacco dependence with current use 1/4-1/2 ppd for 50 years. Aware of increased risk for lung cancer and other respiratory diseases recommend cessation.  This will be reminded at each clinical visit.  Cocaine abuse ([place]) Quick straight to the point cocaine will stop your heart asystole dead you decide what you want to do gave information on AVS for resources   Chronic gastric ulcer due to Helicobacter pylori followed by [facility] discharge follow-up Per discharge summary  1 Follow up with Dustin Burn, PA-C (Cardiology) on 09/17/2020; Please arrive 15 min early than your cardiology hospital follow up appointment at 11:15 am  Encounter to establish care Establishing care with new PCP   Primary hypertension Counseled on blood pressure goal of less than 130/80, low-sodium, DASH diet, medication compliance,  150 minutes of moderate intensity exercise per week. Discussed medication compliance, adverse effects. Increased amlodipine 10 mg daily  -     amLODipine (NORVASC) 10 MG tablet; Take 1 tablet (10 mg total) by mouth daily.   This note has been created with Surveyor, quantity. Any transcriptional errors are unintentional.   Kerin Perna, NP 09/25/2020, 2:08 PM

## 2020-10-01 ENCOUNTER — Ambulatory Visit: Payer: 59 | Admitting: Gastroenterology

## 2020-10-17 ENCOUNTER — Encounter: Payer: Self-pay | Admitting: Certified Registered Nurse Anesthetist

## 2020-10-18 ENCOUNTER — Telehealth: Payer: Self-pay | Admitting: Gastroenterology

## 2020-10-18 ENCOUNTER — Encounter: Payer: Medicare HMO | Admitting: Gastroenterology

## 2020-10-18 ENCOUNTER — Other Ambulatory Visit: Payer: Self-pay | Admitting: Gastroenterology

## 2020-10-18 NOTE — Telephone Encounter (Signed)
Patient was rescheduled and needs new instructions.

## 2020-10-18 NOTE — Telephone Encounter (Signed)
NEW 7-11 EGD INSTRUCTIONS COMPLETED AND MAILED TO PT BY PV

## 2020-10-18 NOTE — Telephone Encounter (Addendum)
To clarify for documentation purposes - this patient called the [facility] in AM to cancel his EGD because his care partner fell through.  He was rescheduled to 11/04/20 by [facility] staff

## 2020-11-01 ENCOUNTER — Encounter: Payer: Medicare HMO | Admitting: Gastroenterology

## 2020-11-04 ENCOUNTER — Encounter: Payer: Self-pay | Admitting: Gastroenterology

## 2020-11-04 ENCOUNTER — Other Ambulatory Visit: Payer: Self-pay

## 2020-11-04 ENCOUNTER — Ambulatory Visit: Payer: Medicare HMO | Admitting: Gastroenterology

## 2020-11-04 VITALS — BP 139/82 | HR 76 | Temp 97.8°F | Ht 65.0 in | Wt 139.0 lb

## 2020-11-04 MED ORDER — SODIUM CHLORIDE 0.9 % IV SOLN: 500.0000 mL | Freq: Once | INTRAVENOUS | Status: AC

## 2020-11-04 NOTE — Progress Notes (Signed)
Pt's states no medical or surgical changes since previsit or office visit. 

## 2020-11-04 NOTE — Progress Notes (Signed)
Pt states he ate steak and eggs at 0700 today.  Michaelle Birks RN spoke with Dr. Loletha Carrow who stated that the pt had cancelled or not shown for previous procedures in the past.  Dr. Loletha Carrow advised D. Crockett RN to tell the pt that they couldn't do his EGD due to his eating today.  He also instructed [place] to offer the pt the opportunity to re-schedule his EGD today or at a future time ,and that he would allow him one more chance to have his procedure done here.    Advised pt about cancelling the procedure and rescheduling, but he refused and stated that he didn't have issues in the past.  He also stated that "there are other doctors out there", and that he would have to ask his brother about transportation anyway. Pt left the facility without rescheduling.

## 2020-11-05 ENCOUNTER — Other Ambulatory Visit (INDEPENDENT_AMBULATORY_CARE_PROVIDER_SITE_OTHER): Payer: Self-pay | Admitting: Primary Care

## 2020-11-05 ENCOUNTER — Other Ambulatory Visit (INDEPENDENT_AMBULATORY_CARE_PROVIDER_SITE_OTHER): Payer: Medicare HMO | Admitting: Primary Care

## 2020-11-05 ENCOUNTER — Other Ambulatory Visit (INDEPENDENT_AMBULATORY_CARE_PROVIDER_SITE_OTHER): Payer: Medicare HMO

## 2020-11-05 NOTE — Addendum Note (Signed)
Addended by: Juluis Mire on: 11/05/2020 01:45 PM   Modules accepted: Orders

## 2020-11-06 ENCOUNTER — Ambulatory Visit (INDEPENDENT_AMBULATORY_CARE_PROVIDER_SITE_OTHER): Payer: Medicare HMO | Admitting: Primary Care

## 2020-11-06 ENCOUNTER — Telehealth: Payer: Self-pay | Admitting: Neurology

## 2020-11-06 LAB — CMP14+EGFR
ALT: 18 IU/L (ref 0–44)
AST: 24 IU/L (ref 0–40)
Albumin/Globulin Ratio: 1.5 (ref 1.2–2.2)
Albumin: 4.3 g/dL (ref 3.8–4.8)
Alkaline Phosphatase: 80 IU/L (ref 44–121)
BUN/Creatinine Ratio: 24 (ref 10–24)
BUN: 24 mg/dL (ref 8–27)
Bilirubin Total: <0.2 mg/dL (ref 0.0–1.2)
CO2: 23 mmol/L (ref 20–29)
Calcium: 9 mg/dL (ref 8.6–10.2)
Chloride: 99 mmol/L (ref 96–106)
Creatinine, Ser: 0.98 mg/dL (ref 0.76–1.27)
Globulin, Total: 2.9 g/dL (ref 1.5–4.5)
Glucose: 107 mg/dL — ABNORMAL HIGH (ref 65–99)
Potassium: 4.6 mmol/L (ref 3.5–5.2)
Sodium: 136 mmol/L (ref 134–144)
Total Protein: 7.2 g/dL (ref 6.0–8.5)
eGFR: 86 mL/min/{1.73_m2} (ref >59)

## 2020-11-06 LAB — LIPID PANEL
Chol/HDL Ratio: 2.4 ratio (ref 0.0–5.0)
Cholesterol, Total: 183 mg/dL (ref 100–199)
HDL: 76 mg/dL (ref >39)
LDL Chol Calc (NIH): 78 mg/dL (ref 0–99)
Triglycerides: 172 mg/dL — ABNORMAL HIGH (ref 0–149)
VLDL Cholesterol Cal: 29 mg/dL (ref 5–40)

## 2020-11-06 LAB — CBC WITH DIFFERENTIAL/PLATELET
Basophils Absolute: 0 10*3/uL (ref 0.0–0.2)
Basos: 1 %
EOS (ABSOLUTE): 0.3 10*3/uL (ref 0.0–0.4)
Eos: 3 %
Hematocrit: 42.3 % (ref 37.5–51.0)
Hemoglobin: 14.4 g/dL (ref 13.0–17.7)
Immature Grans (Abs): 0 10*3/uL (ref 0.0–0.1)
Immature Granulocytes: 0 %
Lymphocytes Absolute: 3.9 10*3/uL — ABNORMAL HIGH (ref 0.7–3.1)
Lymphs: 51 %
MCH: 30.6 pg (ref 26.6–33.0)
MCHC: 34 g/dL (ref 31.5–35.7)
MCV: 90 fL (ref 79–97)
Monocytes Absolute: 0.8 10*3/uL (ref 0.1–0.9)
Monocytes: 10 %
Neutrophils Absolute: 2.7 10*3/uL (ref 1.4–7.0)
Neutrophils: 35 %
Platelets: 204 10*3/uL (ref 150–450)
RBC: 4.71 x10E6/uL (ref 4.14–5.80)
RDW: 13.2 % (ref 11.6–15.4)
WBC: 7.8 10*3/uL (ref 3.4–10.8)

## 2020-11-06 NOTE — Telephone Encounter (Signed)
Patient called and requested a refill but the call dropped before the patient could give medication information or pharmacy information.   Patient had stated his phone is out of minutes when I asked for a call back number.  Please get necessary information when patient calls back.

## 2020-11-06 NOTE — Telephone Encounter (Signed)
Pt called back in stating he needs a refill of his Dilantin sent to [facility]. He gave a friend's phone number. His phone is cut off.

## 2020-11-07 ENCOUNTER — Other Ambulatory Visit: Payer: Self-pay | Admitting: Neurology

## 2020-11-07 DIAGNOSIS — G40209 Localization-related (focal) (partial) symptomatic epilepsy and epileptic syndromes with complex partial seizures, not intractable, without status epilepticus: Secondary | ICD-10-CM

## 2020-11-07 MED ORDER — PHENYTOIN 50 MG PO CHEW: CHEWABLE_TABLET | ORAL | 5 refills | Status: DC

## 2020-11-07 MED ORDER — PHENYTOIN SODIUM EXTENDED 100 MG PO CAPS: 200.0000 mg | ORAL_CAPSULE | Freq: Every morning | ORAL | 5 refills | Status: DC

## 2020-11-07 NOTE — Telephone Encounter (Signed)
Advised pt friend script sent to the pharmacy.

## 2020-11-08 ENCOUNTER — Telehealth: Payer: Self-pay | Admitting: Primary Care

## 2020-11-08 NOTE — Telephone Encounter (Signed)
Copied from [place] [id number]. Topic: General - Other >> Nov 07, 2020 12:27 PM Wynetta Emery, [place] C wrote: Reason for CRM: pt called in for lab results.

## 2020-11-08 NOTE — Progress Notes (Unsigned)
Pt called the office per the pt the Pharmacy never recevied the 200 mg Dilantin 2 cap in the morning,  After reviewing the pt chart it looks like that script never went through. Not sure why.  Advised pt we will let Dr.Jaffe know and see if he could try again, if not we will fax it over or call it in.

## 2020-11-11 NOTE — Telephone Encounter (Signed)
Will contact patient once PCP has addressed lab results.

## 2020-11-14 ENCOUNTER — Telehealth: Payer: Self-pay | Admitting: Neurology

## 2020-11-14 DIAGNOSIS — G40209 Localization-related (focal) (partial) symptomatic epilepsy and epileptic syndromes with complex partial seizures, not intractable, without status epilepticus: Secondary | ICD-10-CM

## 2020-11-14 MED ORDER — PHENYTOIN SODIUM EXTENDED 100 MG PO CAPS: 200.0000 mg | ORAL_CAPSULE | Freq: Every morning | ORAL | 5 refills | Status: DC

## 2020-11-14 NOTE — Telephone Encounter (Signed)
The status on Dilantin 100 mg shows "no print". So can we resent?

## 2020-11-14 NOTE — Telephone Encounter (Signed)
Resent as instructed. Patient have been notified.

## 2020-11-14 NOTE — Telephone Encounter (Signed)
Pt called in stating the pharmacy never got the prescription for the 100mg  of Dilantin, they only got the 50mg .

## 2020-12-10 ENCOUNTER — Other Ambulatory Visit: Payer: Self-pay | Admitting: Neurology

## 2021-01-06 ENCOUNTER — Other Ambulatory Visit: Payer: Self-pay | Admitting: Gastroenterology

## 2021-02-06 ENCOUNTER — Other Ambulatory Visit (INDEPENDENT_AMBULATORY_CARE_PROVIDER_SITE_OTHER): Payer: Self-pay | Admitting: Primary Care

## 2021-02-06 ENCOUNTER — Other Ambulatory Visit: Payer: Self-pay | Admitting: Gastroenterology

## 2021-02-06 DIAGNOSIS — I10 Essential (primary) hypertension: Secondary | ICD-10-CM

## 2021-02-06 NOTE — Telephone Encounter (Signed)
Sent to PCP ?

## 2021-02-10 ENCOUNTER — Other Ambulatory Visit: Payer: Self-pay | Admitting: Neurology

## 2021-02-10 DIAGNOSIS — G40209 Localization-related (focal) (partial) symptomatic epilepsy and epileptic syndromes with complex partial seizures, not intractable, without status epilepticus: Secondary | ICD-10-CM

## 2021-02-22 ENCOUNTER — Encounter (HOSPITAL_COMMUNITY): Payer: Self-pay | Admitting: Emergency Medicine

## 2021-02-22 ENCOUNTER — Other Ambulatory Visit: Payer: Self-pay

## 2021-02-22 ENCOUNTER — Emergency Department (HOSPITAL_COMMUNITY)
Admission: EM | Admit: 2021-02-22 | Discharge: 2021-02-22 | Disposition: A | Discharge status: Home / Self Care | Payer: Medicare HMO | Attending: Emergency Medicine | Admitting: Emergency Medicine

## 2021-02-22 DIAGNOSIS — I10 Essential (primary) hypertension: Secondary | ICD-10-CM | POA: Diagnosis not present

## 2021-02-22 DIAGNOSIS — E119 Type 2 diabetes mellitus without complications: Secondary | ICD-10-CM | POA: Insufficient documentation

## 2021-02-22 DIAGNOSIS — F17210 Nicotine dependence, cigarettes, uncomplicated: Secondary | ICD-10-CM | POA: Insufficient documentation

## 2021-02-22 DIAGNOSIS — M542 Cervicalgia: Secondary | ICD-10-CM | POA: Diagnosis not present

## 2021-02-22 DIAGNOSIS — Z79899 Other long term (current) drug therapy: Secondary | ICD-10-CM | POA: Diagnosis not present

## 2021-02-22 DIAGNOSIS — M436 Torticollis: Secondary | ICD-10-CM

## 2021-02-22 MED ORDER — OXYCODONE-ACETAMINOPHEN 5-325 MG PO TABS: 1.0000 | ORAL_TABLET | Freq: Four times a day (QID) | ORAL | 0 refills | Status: DC | PRN

## 2021-02-22 MED ORDER — KETOROLAC TROMETHAMINE 30 MG/ML IJ SOLN
30.0000 mg | Freq: Once | INTRAMUSCULAR | Status: AC
  Administered 2021-02-22: 30 mg via INTRAMUSCULAR
  Filled 2021-02-22: qty 1

## 2021-02-22 MED ORDER — DIAZEPAM 5 MG PO TABS
5.0000 mg | ORAL_TABLET | Freq: Once | ORAL | Status: AC
  Administered 2021-02-22: 5 mg via ORAL
  Filled 2021-02-22: qty 1

## 2021-02-22 MED ORDER — OXYCODONE-ACETAMINOPHEN 5-325 MG PO TABS
1.0000 | ORAL_TABLET | Freq: Once | ORAL | Status: AC
  Administered 2021-02-22: 1 via ORAL
  Filled 2021-02-22: qty 1

## 2021-02-22 MED ORDER — METAXALONE 800 MG PO TABS: 800.0000 mg | ORAL_TABLET | Freq: Three times a day (TID) | ORAL | 0 refills | Status: DC

## 2021-02-22 NOTE — ED Provider Notes (Signed)
[name] EMERGENCY DEPARTMENT Provider Note   CSN: [id number] Arrival date & time: 02/22/21  1141     History Chief Complaint  Patient presents with   Neck Pain    [name] is a 65 y.o. male.  65 year old male presents with several days of left-sided neck pain.  Pain starts on the left side of neck and goes down to the left shoulder.  Denies any history of trauma.  No weakness in his left hand.  No right he was concerned about possibility of shingles however he has not had any rashes or fever.  The skin is not sensitive to the touch.  No treatment used prior to arrival      Past Medical History:  Diagnosis Date   Acute gastric ulcer without hemorrhage or perforation    Atherosclerosis of aorta ([place]) 08/05/2020   Blindness of right eye    Cocaine abuse ([place])    Coronary artery calcification seen on CAT scan 08/05/2020   CTA Chest & AP 08/04/20: Severe proximal left anterior descending and left circumflex coronary arterial atherosclerotic calcifications   Diabetes mellitus    Epilepsy ([place])    As reported by patient   Gastric ulcer due to Helicobacter pylori 4/76/5465    STOMACH, ANTRUM AND BODY, BIOPSY:  Gastric antral and oxyntic mucosa with Helicobacter pylori-associated gastritis (confirmed with Warthin Starry stain)    Hypertension 01/24/2017   Localization-related symptomatic epilepsy and epileptic syndromes with complex partial seizures, not intractable, without status epilepticus ([place]) 01/29/2015   LUNG ABSCESS 03/14/2008   Qualifier: Diagnosis of  By: Melvyn Novas MD, Legrand Como B    LVH (left ventricular hypertrophy) 08/05/2020   transthoracic echocardiogram 08/05/20: moderate asymmetric left ventricular hypertrophy of the basal-septal segment. Left ventricular diastolic parameters are consistent with Grade I diastolic dysfunction    Multiple gallstones 08/05/2020   CTA 08/04/20: multiple peripherally calcified infundibular gallstones   Overdose 01/24/2017    Sciatica of right side 04/30/2016   Seizures ([place])    since age 75;last seizure 3-4 years ago per pt   Stroke ([place]) 2 years ago   Tobacco dependence with current use 08/05/2020    Patient Active Problem List   Diagnosis Date Noted   Gastric ulcer due to Helicobacter pylori 03/54/6568   Acute gastric ulcer without hemorrhage or perforation    Multiple gallstones 08/05/2020   Coronary artery calcification seen on CAT scan 08/05/2020   Atherosclerosis of aorta ([place]) 08/05/2020   Tobacco dependence with current use 08/05/2020   Dysphagia, pharyngeal 08/05/2020   LVH (left ventricular hypertrophy) 08/05/2020   Cocaine abuse ([place])    Chest pain 08/04/2020   Hypertension 01/24/2017   Back pain 01/24/2017   Localization-related symptomatic epilepsy and epileptic syndromes with complex partial seizures, not intractable, without status epilepticus ([place]) 01/29/2015   Blindness of right eye     Past Surgical History:  Procedure Laterality Date   BIOPSY  08/06/2020   Procedure: BIOPSY;  Surgeon: Jerene Bears, MD;  Location: [facility] ENDOSCOPY;  Service: Gastroenterology;;   ESOPHAGOGASTRODUODENOSCOPY (EGD) WITH PROPOFOL N/A 08/06/2020   Procedure: ESOPHAGOGASTRODUODENOSCOPY (EGD) WITH PROPOFOL;  Surgeon: Jerene Bears, MD;  Location: [facility];  Service: Gastroenterology;  Laterality: N/A;   NO PAST SURGERIES         Family History  Problem Relation Age of Onset   Diabetes Mother    Hypertension Mother    Colon cancer Neg Hx    Pancreatic cancer Neg Hx    Liver cancer  Neg Hx    Esophageal cancer Neg Hx     Social History   Tobacco Use   Smoking status: Every Day    Packs/day: 0.25    Types: Cigarettes   Smokeless tobacco: Never  Vaping Use   Vaping Use: Never used  Substance Use Topics   Alcohol use: Yes    Comment: one 40 oz beer each day   Drug use: Yes    Types: Cocaine    Comment: cocaine--weekly per pt; last time couple days ago    Home Medications Prior to Admission  medications   Medication Sig Start Date End Date Taking? Authorizing Provider  amLODipine (NORVASC) 10 MG tablet TAKE 1 TABLET (10 MG TOTAL) BY MOUTH DAILY. 02/13/21   Kerin Perna, NP  bismuth-metronidazole-tetracycline [facility]) [phone] MG capsule Take 3 capsules by mouth 4 (four) times daily -  before meals and at bedtime. 08/13/20   Pyrtle, Lajuan Lines, MD  Cholecalciferol (VITAMIN D) 50 MCG (2000 UT) tablet Take 2,000 Units by mouth daily. 07/24/20   [provider]  divalproex (DEPAKOTE ER) 500 MG 24 hr tablet Take 4 tablets twice daily 05/02/20   Pieter Partridge, DO  famotidine (PEPCID) 20 MG tablet Take 1 tablet (20 mg total) by mouth 2 (two) times daily as needed for heartburn or indigestion. 08/06/20   Zola Button, MD  levETIRAcetam (KEPPRA) 750 MG tablet TAKE 1 TABLET (750 MG TOTAL) BY MOUTH 2 (TWO) TIMES DAILY. 12/10/20   Tomi Likens, Adam R, DO  pantoprazole (PROTONIX) 40 MG tablet TAKE 1 TABLET (40 MG TOTAL) BY MOUTH 2 (TWO) TIMES DAILY. 01/06/21   Doran Stabler, MD  phenytoin (DILANTIN) 100 MG ER capsule TAKE 2 CAPSULES (200 MG TOTAL) BY MOUTH IN THE MORNING. 02/14/21   Tomi Likens, Adam R, DO  phenytoin (PHENYTOIN INFATABS) 50 MG tablet CHEW 1 TABLET (50 MG TOTAL) BY MOUTH AT BEDTIME. 11/07/20   Pieter Partridge, DO    Allergies    Mobic [meloxicam]  Review of Systems   Review of Systems  All other systems reviewed and are negative.  Physical Exam Updated Vital Signs BP (!) 141/87 (BP Location: Left Arm)   Pulse 90   Temp 99 F (37.2 C)   Resp 18   SpO2 98%   Physical Exam Vitals and nursing note reviewed.  Constitutional:      General: He is not in acute distress.    Appearance: Normal appearance. He is well-developed. He is not toxic-appearing.  HENT:     Head: Normocephalic and atraumatic.  Eyes:     General: Lids are normal.     Conjunctiva/sclera: Conjunctivae normal.     Pupils: Pupils are equal, round, and reactive to light.  Neck:     Thyroid: No thyroid  mass.     Trachea: No tracheal deviation.      Comments: Tender to palpation along the paraspinal cervical muscles. Cardiovascular:     Rate and Rhythm: Normal rate and regular rhythm.     Heart sounds: Normal heart sounds. No murmur heard.   No gallop.  Pulmonary:     Effort: Pulmonary effort is normal. No respiratory distress.     Breath sounds: Normal breath sounds. No stridor. No decreased breath sounds, wheezing, rhonchi or rales.  Abdominal:     General: There is no distension.     Palpations: Abdomen is soft.     Tenderness: There is no abdominal tenderness. There is no rebound.  Musculoskeletal:  General: No tenderness. Normal range of motion.     Cervical back: Normal range of motion and neck supple.  Skin:    General: Skin is warm and dry.     Findings: No abrasion or rash.  Neurological:     Mental Status: He is alert and oriented to person, place, and time. Mental status is at baseline.     GCS: GCS eye subscore is 4. GCS verbal subscore is 5. GCS motor subscore is 6.     Cranial Nerves: No cranial nerve deficit.     Sensory: No sensory deficit.     Motor: Motor function is intact.  Psychiatric:        Attention and Perception: Attention normal.        Speech: Speech normal.        Behavior: Behavior normal.    ED Results / Procedures / Treatments   Labs (all labs ordered are listed, but only abnormal results are displayed) Labs Reviewed - No data to display  EKG None  Radiology No results found.  Procedures Procedures   Medications Ordered in ED Medications  diazepam (VALIUM) tablet 5 mg (has no administration in time range)  oxyCODONE-acetaminophen (PERCOCET/ROXICET) 5-325 MG per tablet 1 tablet (has no administration in time range)  ketorolac (TORADOL) 30 MG/ML injection 30 mg (has no administration in time range)    ED Course  I have reviewed the triage vital signs and the nursing notes.  Pertinent labs & imaging results that were  available during my care of the patient were reviewed by me and considered in my medical decision making (see chart for details).    MDM Rules/Calculators/A&P                          Patient likely torticollis.  No neurological features.  Medicated here and will place on meds and return precautions given Final Clinical Impression(s) / ED Diagnoses Final diagnoses:  None    Rx / DC Orders ED Discharge Orders     None        Lacretia Leigh, MD 02/22/21 1637

## 2021-02-22 NOTE — ED Provider Notes (Signed)
Emergency Medicine Provider Triage Evaluation Note  [name] , a 65 y.o. male  was evaluated in triage.  Pt complains of left shoulder pain, neck pain for the last three days. Patient reports this feels exactly like when he had shingles a few years ago. No rash noted. No chest pain, SOB. No numbness or tingling. Pain 10/10  Review of Systems  Positive: Neck pain, shoulder pain Negative: rash  Physical Exam  BP (!) 141/87 (BP Location: Left Arm)   Pulse 90   Temp 99 F (37.2 C)   Resp 18   SpO2 98%  Gen:   Awake, no distress   Resp:  Normal effort  MSK:   Moves extremities without difficulty  Other:  TTP left shoulder, neck  Medical Decision Making  Medically screening exam initiated at 12:22 PM.  Appropriate orders placed.  [name] was informed that the remainder of the evaluation will be completed by another provider, this initial triage assessment does not replace that evaluation, and the importance of remaining in the ED until their evaluation is complete.  Neck pain   [name] 02/22/21 1224    Tegeler, Gwenyth Allegra, MD 02/22/21 2026

## 2021-02-22 NOTE — ED Triage Notes (Signed)
Pt to triage via [facility] from home.  Reports L sided neck pain x 3 days.  States it feels like the last time he had shingles.  No rash.

## 2021-04-02 ENCOUNTER — Other Ambulatory Visit: Payer: Self-pay | Admitting: Neurology

## 2021-04-02 DIAGNOSIS — G40209 Localization-related (focal) (partial) symptomatic epilepsy and epileptic syndromes with complex partial seizures, not intractable, without status epilepticus: Secondary | ICD-10-CM

## 2021-04-29 ENCOUNTER — Other Ambulatory Visit: Payer: Self-pay | Admitting: Neurology

## 2021-05-07 NOTE — Progress Notes (Deleted)
NEUROLOGY FOLLOW UP OFFICE NOTE  [name] [id number]  Assessment/Plan:   Focal onset seizures with impairment of consciousness  Depakote ER 200mg  twice daily; Keppra 750mg  twice daily; Dilantin 200mg  in AM and 50mg  in PM Calcium and vit D Follow up one year  Subjective:  [name] is a 66 year old right-handed male with diabetes mellitus and right eye blindness who follows up for localization-related epilepsy.Marland Kitchen   UPDATE: Medications: Depakote ER 2000 mg twice daily, Keppra 750 mg twice daily, Dilantin 200 mg in a.m. and 50 mg in p.m   Last seizure:  March 2019 (in setting of medication noncompliance).  He reports one 60 second "aura" that occurred last week.  He says he has been compliant with his medications.   He is doing well.   11/05/2020 LABS:  CBC with WBC 7.8, HGB 14.4, HCT 42.3, PLT 204; CMP with Na 136, K 4.6, Cl 99, CO2 23, glucose 107, BUN 24, Cr 0.98, t bili <0.2, ALP 80, AST 24, ALT 18, GFR 86.   HISTORY: He began having seizures at age 68.  It may have been precipitated by a head injury after falling out of his crib.     Semiology:  Aura of colored lights in right visual field.  It may last several minutes.  Usually the warning of the aura gives him time to lay down and call the ambulance.  Sometimes it resolves, while other times it progresses to a full-blown seizure, described as loss of consciousness, generalized jerking and tongue biting.  He averages approximately 1-2 seizures per year.  But he says last seizure was around 1 to 1.5 years ago.   He presented to the ED at [facility] on 07/03/2017 because he began experiencing his habitual aura prior to his seizure, specifically flashing lights.  He called EMS and was transported to the hospital.  While on the stretcher, he did have a tonic-clonic seizure.  He was given 2 mg of Ativan.  He reportedly had been drinking alcohol.  He reported medication compliance.  However, valproic acid level was  undetectable and Keppra level was low at 1.5.  Dilantin level was 7.3.  EtOH level was 14.  Basic metabolic panel showed a slight anion gap of 17.  CBC was unremarkable.  He was discharged once stable.   He takes Depakote ER 2000mg  twice daily, phenytoin 200mg  in AM and 50mg  at night, Keppra 750mg  twice daily.     Prior medication:  phenobarbital.  Higher doses of Dilantin caused ataxia.   09/20/09 MRI Brain w/o:  mild generalized atrophy with prominence to the cerebellum.  Post-operative changes of right globe.  PAST MEDICAL HISTORY: Past Medical History:  Diagnosis Date   Acute gastric ulcer without hemorrhage or perforation    Atherosclerosis of aorta ([place]) 08/05/2020   Blindness of right eye    Cocaine abuse ([place])    Coronary artery calcification seen on CAT scan 08/05/2020   CTA Chest & AP 08/04/20: Severe proximal left anterior descending and left circumflex coronary arterial atherosclerotic calcifications   Diabetes mellitus    Epilepsy ([place])    As reported by patient   Gastric ulcer due to Helicobacter pylori 07/24/5186    STOMACH, ANTRUM AND BODY, BIOPSY:  Gastric antral and oxyntic mucosa with Helicobacter pylori-associated gastritis (confirmed with Warthin Starry stain)    Hypertension 01/24/2017   Localization-related symptomatic epilepsy and epileptic syndromes with complex partial seizures, not intractable, without status epilepticus ([place]) 01/29/2015   LUNG ABSCESS  03/14/2008   Qualifier: Diagnosis of  By: Melvyn Novas MD, Christena Deem    LVH (left ventricular hypertrophy) 08/05/2020   transthoracic echocardiogram 08/05/20: moderate asymmetric left ventricular hypertrophy of the basal-septal segment. Left ventricular diastolic parameters are consistent with Grade I diastolic dysfunction    Multiple gallstones 08/05/2020   CTA 08/04/20: multiple peripherally calcified infundibular gallstones   Overdose 01/24/2017   Sciatica of right side 04/30/2016   Seizures ([place])    since age 40;last seizure  3-4 years ago per pt   Stroke ([place]) 2 years ago   Tobacco dependence with current use 08/05/2020    MEDICATIONS: Current Outpatient Medications on File Prior to Visit  Medication Sig Dispense Refill   amLODipine (NORVASC) 10 MG tablet TAKE 1 TABLET (10 MG TOTAL) BY MOUTH DAILY. 90 tablet 1   bismuth-metronidazole-tetracycline (PYLERA) 140-125-125 MG capsule Take 3 capsules by mouth 4 (four) times daily -  before meals and at bedtime. 120 capsule 0   Cholecalciferol (VITAMIN D) 50 MCG (2000 UT) tablet Take 2,000 Units by mouth daily.     divalproex (DEPAKOTE ER) 500 MG 24 hr tablet TAKE 4 TABLETS BY MOUTH TWICE DAILY 240 tablet 0   famotidine (PEPCID) 20 MG tablet Take 1 tablet (20 mg total) by mouth 2 (two) times daily as needed for heartburn or indigestion. 30 tablet 0   levETIRAcetam (KEPPRA) 750 MG tablet TAKE 1 TABLET (750 MG TOTAL) BY MOUTH 2 (TWO) TIMES DAILY. 60 tablet 4   metaxalone (SKELAXIN) 800 MG tablet Take 1 tablet (800 mg total) by mouth 3 (three) times daily. 21 tablet 0   oxyCODONE-acetaminophen (PERCOCET/ROXICET) 5-325 MG tablet Take 1 tablet by mouth every 6 (six) hours as needed for severe pain. 10 tablet 0   pantoprazole (PROTONIX) 40 MG tablet TAKE 1 TABLET (40 MG TOTAL) BY MOUTH 2 (TWO) TIMES DAILY. 60 tablet 0   phenytoin (DILANTIN) 100 MG ER capsule TAKE 2 CAPSULES (200 MG TOTAL) BY MOUTH IN THE MORNING. 180 capsule 2   phenytoin (PHENYTOIN INFATABS) 50 MG tablet CHEW 1 TABLET (50 MG TOTAL) BY MOUTH AT BEDTIME. 30 tablet 5   Current Facility-Administered Medications on File Prior to Visit  Medication Dose Route Frequency Provider Last Rate Last Admin   0.9 %  sodium chloride infusion  500 mL Intravenous Once Danis, Estill Cotta III, MD        ALLERGIES: Allergies  Allergen Reactions   Mobic [Meloxicam] Nausea Only    "pain all over"    FAMILY HISTORY: Family History  Problem Relation Age of Onset   Diabetes Mother    Hypertension Mother    Colon cancer Neg Hx     Pancreatic cancer Neg Hx    Liver cancer Neg Hx    Esophageal cancer Neg Hx       Objective:  *** General: No acute distress.  Patient appears ***-groomed.   Head:  Normocephalic/atraumatic Eyes:  Fundi examined but not visualized Neck: supple, no paraspinal tenderness, full range of motion Heart:  Regular rate and rhythm Lungs:  Clear to auscultation bilaterally Back: No paraspinal tenderness Neurological Exam: alert and oriented to person, place, and time.  Speech fluent and not dysarthric, language intact.  CN II-XII intact. Bulk and tone normal, muscle strength 5/5 throughout.  Sensation to light touch intact.  Deep tendon reflexes 2+ throughout, toes downgoing.  Finger to nose testing intact.  Gait normal, Romberg negative.   Metta Clines, DO  CC: ***

## 2021-05-08 ENCOUNTER — Ambulatory Visit: Payer: Medicare HMO | Admitting: Neurology

## 2021-05-08 ENCOUNTER — Encounter: Payer: Self-pay | Admitting: Neurology

## 2021-05-08 DIAGNOSIS — Z029 Encounter for administrative examinations, unspecified: Secondary | ICD-10-CM

## 2021-06-02 ENCOUNTER — Other Ambulatory Visit: Payer: Self-pay | Admitting: Neurology

## 2021-06-04 NOTE — Telephone Encounter (Signed)
Tried calling patient, Phone disconnected.  Will send a letter to the address on file.

## 2021-06-09 NOTE — Telephone Encounter (Signed)
Tried to call patient to schedule an appointment.  Could not get thru.

## 2021-06-09 NOTE — Telephone Encounter (Signed)
Patient called stating he needs Keppra and Depakot refilled.  Please send to [facility].

## 2021-06-10 ENCOUNTER — Telehealth: Payer: Self-pay | Admitting: Neurology

## 2021-06-10 DIAGNOSIS — G40209 Localization-related (focal) (partial) symptomatic epilepsy and epileptic syndromes with complex partial seizures, not intractable, without status epilepticus: Secondary | ICD-10-CM

## 2021-06-10 MED ORDER — LEVETIRACETAM 750 MG PO TABS: 750.0000 mg | ORAL_TABLET | Freq: Two times a day (BID) | ORAL | 0 refills | Status: DC

## 2021-06-10 MED ORDER — PHENYTOIN SODIUM EXTENDED 100 MG PO CAPS: 200.0000 mg | ORAL_CAPSULE | Freq: Every morning | ORAL | 0 refills | Status: DC

## 2021-06-10 NOTE — Progress Notes (Signed)
NEUROLOGY FOLLOW UP OFFICE NOTE  [name] [id number]  Assessment/Plan:   Focal onset seizures with impaired consciousness  Depakote ER 2000mg  twice daily; Keppra 750mg  twice daily; Dilantin 200mg  in AM and 50mg  in PM. 2.  Calcium and vitamin D 3.  Check CBC, CMP, phenytoin level, levetiracetam level and valproic acid level. 4.  Follow up one year  Subjective:  [name] is a 66 year old right-handed male with diabetes mellitus and right eye blindness who follows up for localization-related epilepsy.Marland Kitchen   UPDATE: Medications: Depakote ER 2000 mg twice daily, Keppra 750 mg twice daily, Dilantin 200 mg in a.m. and 50 mg in p.m   Last seizure:  March 2019 (in setting of medication noncompliance).  He reports one 60 second "aura" that occurred last week.  He says he has been compliant with his medications.   He is doing well.   11/05/2020 LABS:  CBC with WBC 7.8, HGB 14.4, HCT 42.3, PLT 204; CMP with Na 136, K 4.6, Cl 99, CO2 23, Ca 9, glucose 107, BUN 24, Cr 0.98, t bili <0.2, ALP 80, AST 24, ALT 18.   HISTORY: He began having seizures at age 36.  It may have been precipitated by a head injury after falling out of his crib.     Semiology:  Aura of colored lights in right visual field.  It may last several minutes.  Usually the warning of the aura gives him time to lay down and call the ambulance.  Sometimes it resolves, while other times it progresses to a full-blown seizure, described as loss of consciousness, generalized jerking and tongue biting.  He averages approximately 1-2 seizures per year.  But he says last seizure was around 1 to 1.5 years ago.   He presented to the ED at [facility] on 07/03/2017 because he began experiencing his habitual aura prior to his seizure, specifically flashing lights.  He called EMS and was transported to the hospital.  While on the stretcher, he did have a tonic-clonic seizure.  He was given 2 mg of Ativan.  He reportedly had been  drinking alcohol.  He reported medication compliance.  However, valproic acid level was undetectable and Keppra level was low at 1.5.  Dilantin level was 7.3.  EtOH level was 14.  Basic metabolic panel showed a slight anion gap of 17.  CBC was unremarkable.  He was discharged once stable.   He takes Depakote ER 2000mg  twice daily, phenytoin 200mg  in AM and 50mg  at night, Keppra 750mg  twice daily.     Prior medication:  phenobarbital.  Higher doses of Dilantin caused ataxia.   09/20/09 MRI Brain w/o:  mild generalized atrophy with prominence to the cerebellum.  Post-operative changes of right globe.  PAST MEDICAL HISTORY: Past Medical History:  Diagnosis Date   Acute gastric ulcer without hemorrhage or perforation    Atherosclerosis of aorta ([place]) 08/05/2020   Blindness of right eye    Cocaine abuse ([place])    Coronary artery calcification seen on CAT scan 08/05/2020   CTA Chest & AP 08/04/20: Severe proximal left anterior descending and left circumflex coronary arterial atherosclerotic calcifications   Diabetes mellitus    Epilepsy ([place])    As reported by patient   Gastric ulcer due to Helicobacter pylori 7/82/9562    STOMACH, ANTRUM AND BODY, BIOPSY:  Gastric antral and oxyntic mucosa with Helicobacter pylori-associated gastritis (confirmed with Warthin Starry stain)    Hypertension 01/24/2017   Localization-related symptomatic epilepsy and epileptic  syndromes with complex partial seizures, not intractable, without status epilepticus ([place]) 01/29/2015   LUNG ABSCESS 03/14/2008   Qualifier: Diagnosis of  By: Melvyn Novas MD, Legrand Como B    LVH (left ventricular hypertrophy) 08/05/2020   transthoracic echocardiogram 08/05/20: moderate asymmetric left ventricular hypertrophy of the basal-septal segment. Left ventricular diastolic parameters are consistent with Grade I diastolic dysfunction    Multiple gallstones 08/05/2020   CTA 08/04/20: multiple peripherally calcified infundibular gallstones   Overdose  01/24/2017   Sciatica of right side 04/30/2016   Seizures ([place])    since age 77;last seizure 3-4 years ago per pt   Stroke ([place]) 2 years ago   Tobacco dependence with current use 08/05/2020    MEDICATIONS: Current Outpatient Medications on File Prior to Visit  Medication Sig Dispense Refill   amLODipine (NORVASC) 10 MG tablet TAKE 1 TABLET (10 MG TOTAL) BY MOUTH DAILY. 90 tablet 1   bismuth-metronidazole-tetracycline (PYLERA) 140-125-125 MG capsule Take 3 capsules by mouth 4 (four) times daily -  before meals and at bedtime. 120 capsule 0   Cholecalciferol (VITAMIN D) 50 MCG (2000 UT) tablet Take 2,000 Units by mouth daily.     divalproex (DEPAKOTE ER) 500 MG 24 hr tablet TAKE 4 TABLETS BY MOUTH TWICE DAILY 240 tablet 0   famotidine (PEPCID) 20 MG tablet Take 1 tablet (20 mg total) by mouth 2 (two) times daily as needed for heartburn or indigestion. 30 tablet 0   levETIRAcetam (KEPPRA) 750 MG tablet Take 1 tablet (750 mg total) by mouth 2 (two) times daily. 30 tablet 0   metaxalone (SKELAXIN) 800 MG tablet Take 1 tablet (800 mg total) by mouth 3 (three) times daily. 21 tablet 0   oxyCODONE-acetaminophen (PERCOCET/ROXICET) 5-325 MG tablet Take 1 tablet by mouth every 6 (six) hours as needed for severe pain. 10 tablet 0   pantoprazole (PROTONIX) 40 MG tablet TAKE 1 TABLET (40 MG TOTAL) BY MOUTH 2 (TWO) TIMES DAILY. 60 tablet 0   phenytoin (DILANTIN) 100 MG ER capsule Take 2 capsules (200 mg total) by mouth in the morning. 60 capsule 0   phenytoin (PHENYTOIN INFATABS) 50 MG tablet CHEW 1 TABLET (50 MG TOTAL) BY MOUTH AT BEDTIME. 30 tablet 5   Current Facility-Administered Medications on File Prior to Visit  Medication Dose Route Frequency Provider Last Rate Last Admin   0.9 %  sodium chloride infusion  500 mL Intravenous Once Danis, Estill Cotta III, MD        ALLERGIES: Allergies  Allergen Reactions   Mobic [Meloxicam] Nausea Only    "pain all over"    FAMILY HISTORY: Family History  Problem  Relation Age of Onset   Diabetes Mother    Hypertension Mother    Colon cancer Neg Hx    Pancreatic cancer Neg Hx    Liver cancer Neg Hx    Esophageal cancer Neg Hx       Objective:  Blood pressure 106/71, pulse 74, height 5\' 6"  (1.676 m), weight 131 lb 12.8 oz (59.8 kg), SpO2 100 %. General: No acute distress.  Patient appears well-groomed.   Head:  Normocephalic/atraumatic Eyes:  Fundi examined but not visualized Neck: supple, no paraspinal tenderness, full range of motion Heart:  Regular rate and rhythm Lungs:  Clear to auscultation bilaterally Back: No paraspinal tenderness Neurological Exam: alert and oriented to person, place, and time.  Speech fluent and not dysarthric, language intact.  Blind in right eye.  Scarring of right eye.  No ocular movement of right  eye.  Otherwise, CN II-XII intact. Bulk and tone normal, muscle strength 5/5 throughout.  Sensation to light touch intact.  Deep tendon reflexes 2+ throughout, toes downgoing.  Finger to nose testing intact.  Gait normal, Romberg negative.   Metta Clines, DO  CC: Juluis Mire, NP

## 2021-06-10 NOTE — Telephone Encounter (Signed)
1. Which medications need to be refilled? (please list name of each medication and dose if known) keppra  2. Which pharmacy/location (including street and city if local pharmacy) is medication to be sent to? [facility]

## 2021-06-11 ENCOUNTER — Telehealth: Payer: Self-pay

## 2021-06-11 ENCOUNTER — Encounter: Payer: Self-pay | Admitting: Neurology

## 2021-06-11 ENCOUNTER — Other Ambulatory Visit: Payer: Self-pay

## 2021-06-11 ENCOUNTER — Ambulatory Visit (INDEPENDENT_AMBULATORY_CARE_PROVIDER_SITE_OTHER): Payer: Medicare HMO | Admitting: Neurology

## 2021-06-11 ENCOUNTER — Other Ambulatory Visit (INDEPENDENT_AMBULATORY_CARE_PROVIDER_SITE_OTHER): Payer: Medicare HMO

## 2021-06-11 DIAGNOSIS — G40209 Localization-related (focal) (partial) symptomatic epilepsy and epileptic syndromes with complex partial seizures, not intractable, without status epilepticus: Secondary | ICD-10-CM

## 2021-06-11 MED ORDER — PHENYTOIN SODIUM EXTENDED 100 MG PO CAPS: 200.0000 mg | ORAL_CAPSULE | Freq: Every morning | ORAL | 3 refills | Status: DC

## 2021-06-11 MED ORDER — DIVALPROEX SODIUM ER 500 MG PO TB24: ORAL_TABLET | ORAL | 3 refills | Status: DC

## 2021-06-11 MED ORDER — PHENYTOIN 50 MG PO CHEW: CHEWABLE_TABLET | ORAL | 3 refills | Status: DC

## 2021-06-11 MED ORDER — LEVETIRACETAM 750 MG PO TABS: 750.0000 mg | ORAL_TABLET | Freq: Two times a day (BID) | ORAL | 3 refills | Status: DC

## 2021-06-11 NOTE — Patient Instructions (Signed)
Depakote ER 2000mg  twice daily Keppra 750mg  twice daily Dilantin 200mg  in morning and 50mg  at night Take supplemental calcium and vit D Check CBC, CMP, phenytoin level, valproic acid level and leviteracetam level Follow up one year

## 2021-06-11 NOTE — Telephone Encounter (Signed)
Telephone call from [place]. Please review Script for Depakote says a 90 days but they only received 360 tabs for 4 500 mg tabs BID.  Will send in correct QT tabs

## 2021-06-12 LAB — COMPREHENSIVE METABOLIC PANEL
ALT: 16 IU/L (ref 0–44)
AST: 25 IU/L (ref 0–40)
Albumin/Globulin Ratio: 1.5 (ref 1.2–2.2)
Albumin: 4 g/dL (ref 3.8–4.8)
Alkaline Phosphatase: 73 IU/L (ref 44–121)
BUN/Creatinine Ratio: 16 (ref 10–24)
BUN: 24 mg/dL (ref 8–27)
Bilirubin Total: <0.2 mg/dL (ref 0.0–1.2)
CO2: 27 mmol/L (ref 20–29)
Calcium: 9.5 mg/dL (ref 8.6–10.2)
Chloride: 106 mmol/L (ref 96–106)
Creatinine, Ser: 1.48 mg/dL — ABNORMAL HIGH (ref 0.76–1.27)
Globulin, Total: 2.7 g/dL (ref 1.5–4.5)
Glucose: 81 mg/dL (ref 70–99)
Potassium: 4.9 mmol/L (ref 3.5–5.2)
Sodium: 145 mmol/L — ABNORMAL HIGH (ref 134–144)
Total Protein: 6.7 g/dL (ref 6.0–8.5)
eGFR: 52 mL/min/{1.73_m2} — ABNORMAL LOW (ref >59)

## 2021-06-12 LAB — CBC
Hematocrit: 38.4 % (ref 37.5–51.0)
Hemoglobin: 13.6 g/dL (ref 13.0–17.7)
MCH: 31.1 pg (ref 26.6–33.0)
MCHC: 35.4 g/dL (ref 31.5–35.7)
MCV: 88 fL (ref 79–97)
Platelets: 226 10*3/uL (ref 150–450)
RBC: 4.37 x10E6/uL (ref 4.14–5.80)
RDW: 13 % (ref 11.6–15.4)
WBC: 7.1 10*3/uL (ref 3.4–10.8)

## 2021-06-16 LAB — VALPROIC ACID LEVEL: Valproic Acid Lvl: 70.1 mg/L (ref 50.0–100.0)

## 2021-06-16 LAB — PHENYTOIN LEVEL, TOTAL: Phenytoin, Total: 9.5 mg/L — ABNORMAL LOW (ref 10.0–20.0)

## 2021-06-16 LAB — LEVETIRACETAM LEVEL: Keppra (Levetiracetam): 40.8 ug/mL

## 2021-06-17 ENCOUNTER — Telehealth: Payer: Self-pay | Admitting: Neurology

## 2021-06-17 NOTE — Progress Notes (Signed)
Patient advised of his lab results.

## 2021-06-17 NOTE — Telephone Encounter (Signed)
Patient said he had a missed call on his phone, isnt sure why, would like a call back

## 2021-06-18 NOTE — Telephone Encounter (Signed)
See results note 06/17/21

## 2021-12-04 ENCOUNTER — Other Ambulatory Visit: Payer: Self-pay

## 2021-12-04 ENCOUNTER — Encounter (HOSPITAL_COMMUNITY): Payer: Self-pay | Admitting: Emergency Medicine

## 2021-12-04 ENCOUNTER — Emergency Department (HOSPITAL_COMMUNITY)
Admission: EM | Admit: 2021-12-04 | Discharge: 2021-12-05 | Disposition: A | Discharge status: Skilled Nursing Facility | Payer: Medicare HMO | Attending: Emergency Medicine | Admitting: Emergency Medicine

## 2021-12-04 DIAGNOSIS — M25512 Pain in left shoulder: Secondary | ICD-10-CM | POA: Diagnosis present

## 2021-12-04 DIAGNOSIS — M436 Torticollis: Secondary | ICD-10-CM | POA: Diagnosis not present

## 2021-12-04 DIAGNOSIS — I10 Essential (primary) hypertension: Secondary | ICD-10-CM | POA: Insufficient documentation

## 2021-12-04 DIAGNOSIS — M67912 Unspecified disorder of synovium and tendon, left shoulder: Secondary | ICD-10-CM | POA: Insufficient documentation

## 2021-12-04 DIAGNOSIS — F17210 Nicotine dependence, cigarettes, uncomplicated: Secondary | ICD-10-CM | POA: Insufficient documentation

## 2021-12-04 DIAGNOSIS — E119 Type 2 diabetes mellitus without complications: Secondary | ICD-10-CM | POA: Insufficient documentation

## 2021-12-04 NOTE — ED Provider Triage Note (Signed)
Emergency Medicine Provider Triage Evaluation Note  [name] , a 66 y.o. male  was evaluated in triage.  Pt complains of low back pain and L side pain x several days. Denies injury. States he is having difficulty rolling in bed.   Review of Systems  Positive: Back pain, L side pain Negative: Injury, urinary sx, numbness, weakness  Physical Exam  BP (!) 132/99   Pulse 98   Temp 99.8 F (37.7 C) (Oral)   Resp 18   SpO2 98%  Gen:   Awake, no distress   Resp:  Normal effort  MSK:   Moves extremities without difficulty  Other:    Medical Decision Making  Medically screening exam initiated at 8:12 PM.  Appropriate orders placed.  [name] was informed that the remainder of the evaluation will be completed by another provider, this initial triage assessment does not replace that evaluation, and the importance of remaining in the ED until their evaluation is complete.     Kateri Plummer, PA-C 12/04/21 2014

## 2021-12-04 NOTE — ED Triage Notes (Addendum)
Patient arrived with PTAR from home reports left side body aches / low back pain onset this week , denies injury or fall .

## 2021-12-05 ENCOUNTER — Emergency Department (HOSPITAL_COMMUNITY): Payer: Medicare HMO

## 2021-12-05 DIAGNOSIS — M67912 Unspecified disorder of synovium and tendon, left shoulder: Secondary | ICD-10-CM | POA: Diagnosis not present

## 2021-12-05 MED ORDER — DIAZEPAM 5 MG PO TABS
5.0000 mg | ORAL_TABLET | Freq: Once | ORAL | Status: AC
Start: 2021-12-05 — End: 2021-12-05
  Administered 2021-12-05: 5 mg via ORAL
  Filled 2021-12-05: qty 1

## 2021-12-05 MED ORDER — IBUPROFEN 600 MG PO TABS: 600.0000 mg | ORAL_TABLET | Freq: Four times a day (QID) | ORAL | 0 refills | Status: DC | PRN

## 2021-12-05 MED ORDER — LIDOCAINE 5 % EX PTCH: 1.0000 | MEDICATED_PATCH | Freq: Every day | CUTANEOUS | 0 refills | Status: DC | PRN

## 2021-12-05 MED ORDER — METHOCARBAMOL 500 MG PO TABS: 1000.0000 mg | ORAL_TABLET | Freq: Two times a day (BID) | ORAL | 0 refills | Status: AC

## 2021-12-05 MED ORDER — OXYCODONE-ACETAMINOPHEN 5-325 MG PO TABS
1.0000 | ORAL_TABLET | Freq: Once | ORAL | Status: AC
  Administered 2021-12-05: 1 via ORAL
  Filled 2021-12-05: qty 1

## 2021-12-05 MED ORDER — ACETAMINOPHEN 325 MG PO TABS: 650.0000 mg | ORAL_TABLET | Freq: Four times a day (QID) | ORAL | 0 refills | Status: AC | PRN

## 2021-12-05 MED ORDER — LIDOCAINE 5 % EX PTCH
1.0000 | MEDICATED_PATCH | CUTANEOUS | Status: DC
  Administered 2021-12-05: 1 via TRANSDERMAL
  Filled 2021-12-05: qty 1

## 2021-12-05 NOTE — ED Provider Notes (Signed)
[facility] EMERGENCY DEPARTMENT Provider Note   CSN: [id number] Arrival date & time: 12/04/21  2505     History {Add pertinent medical, surgical, social history, OB history to HPI:1} Chief Complaint  Patient presents with   Back Pain / Left Side Body Aches    [name] is a 66 y.o. male.  Patient as above with significant medical history as below, including cocaine abuse, DM, seizures, HTN who presents to the ED with complaint of pain.  Pain reports pain to the left side of his neck, left shoulder area.  Pain associated movement.  Denies falls or injuries.  Patient was seen approxi-2 months ago with similar complaint.  Patient reports the pain is exactly as it was during that prior evaluation.  Pain did improve with medications he was given in the ER.  He is requesting repeat dose the medications he was given 2 months ago.  He has no cough, no fevers or chills, no rashes.  No abdominal pain, no change in bowel or bladder function.  No numbness or tingling that seems new.  No significant pain to his low back, no stomach pain to his abdomen.  No GU complaints.  Difficulty turning his neck at times secondary to the discomfort.  No numbness or tingling that seems new.  No recent seizures follows with neurology.     Past Medical History:  Diagnosis Date   Acute gastric ulcer without hemorrhage or perforation    Atherosclerosis of aorta ([place]) 08/05/2020   Blindness of right eye    Cocaine abuse ([place])    Coronary artery calcification seen on CAT scan 08/05/2020   CTA Chest & AP 08/04/20: Severe proximal left anterior descending and left circumflex coronary arterial atherosclerotic calcifications   Diabetes mellitus    Epilepsy ([place])    As reported by patient   Gastric ulcer due to Helicobacter pylori 3/97/6734    STOMACH, ANTRUM AND BODY, BIOPSY:  Gastric antral and oxyntic mucosa with Helicobacter pylori-associated gastritis (confirmed with Warthin Starry stain)     Hypertension 01/24/2017   Localization-related symptomatic epilepsy and epileptic syndromes with complex partial seizures, not intractable, without status epilepticus ([place]) 01/29/2015   LUNG ABSCESS 03/14/2008   Qualifier: Diagnosis of  By: Melvyn Novas MD, Legrand Como B    LVH (left ventricular hypertrophy) 08/05/2020   transthoracic echocardiogram 08/05/20: moderate asymmetric left ventricular hypertrophy of the basal-septal segment. Left ventricular diastolic parameters are consistent with Grade I diastolic dysfunction    Multiple gallstones 08/05/2020   CTA 08/04/20: multiple peripherally calcified infundibular gallstones   Overdose 01/24/2017   Sciatica of right side 04/30/2016   Seizures ([place])    since age 55;last seizure 3-4 years ago per pt   Stroke ([place]) 2 years ago   Tobacco dependence with current use 08/05/2020    Past Surgical History:  Procedure Laterality Date   BIOPSY  08/06/2020   Procedure: BIOPSY;  Surgeon: Jerene Bears, MD;  Location: [facility] ENDOSCOPY;  Service: Gastroenterology;;   ESOPHAGOGASTRODUODENOSCOPY (EGD) WITH PROPOFOL N/A 08/06/2020   Procedure: ESOPHAGOGASTRODUODENOSCOPY (EGD) WITH PROPOFOL;  Surgeon: Jerene Bears, MD;  Location: [facility];  Service: Gastroenterology;  Laterality: N/A;   NO PAST SURGERIES       The history is provided by the patient. No language interpreter was used.       Home Medications Prior to Admission medications   Medication Sig Start Date End Date Taking? Authorizing Provider  amLODipine (NORVASC) 10 MG tablet TAKE 1 TABLET (10 MG  TOTAL) BY MOUTH DAILY. 02/13/21   Kerin Perna, NP  bismuth-metronidazole-tetracycline [facility]) [phone] MG capsule Take 3 capsules by mouth 4 (four) times daily -  before meals and at bedtime. 08/13/20   Pyrtle, Lajuan Lines, MD  Cholecalciferol (VITAMIN D) 50 MCG (2000 UT) tablet Take 2,000 Units by mouth daily. 07/24/20   [provider]  divalproex (DEPAKOTE ER) 500 MG 24 hr tablet TAKE 4 TABLETS BY MOUTH  TWICE DAILY 06/11/21   Tomi Likens, Adam R, DO  famotidine (PEPCID) 20 MG tablet Take 1 tablet (20 mg total) by mouth 2 (two) times daily as needed for heartburn or indigestion. 08/06/20   Zola Button, MD  levETIRAcetam (KEPPRA) 750 MG tablet Take 1 tablet (750 mg total) by mouth 2 (two) times daily. 06/11/21   Pieter Partridge, DO  metaxalone (SKELAXIN) 800 MG tablet Take 1 tablet (800 mg total) by mouth 3 (three) times daily. 02/22/21   Lacretia Leigh, MD  oxyCODONE-acetaminophen (PERCOCET/ROXICET) 5-325 MG tablet Take 1 tablet by mouth every 6 (six) hours as needed for severe pain. 02/22/21   Lacretia Leigh, MD  pantoprazole (PROTONIX) 40 MG tablet TAKE 1 TABLET (40 MG TOTAL) BY MOUTH 2 (TWO) TIMES DAILY. 01/06/21   Doran Stabler, MD  phenytoin (DILANTIN) 100 MG ER capsule Take 2 capsules (200 mg total) by mouth in the morning. 06/11/21   Pieter Partridge, DO  phenytoin (PHENYTOIN INFATABS) 50 MG tablet CHEW 1 TABLET (50 MG TOTAL) BY MOUTH AT BEDTIME. 06/11/21   Pieter Partridge, DO      Allergies    Mobic [meloxicam]    Review of Systems   Review of Systems  Constitutional:  Negative for chills and fever.  HENT:  Negative for facial swelling and trouble swallowing.   Eyes:  Negative for photophobia and visual disturbance.  Respiratory:  Negative for cough and shortness of breath.   Cardiovascular:  Negative for chest pain and palpitations.  Gastrointestinal:  Negative for abdominal pain, nausea and vomiting.  Endocrine: Negative for polydipsia and polyuria.  Genitourinary:  Negative for difficulty urinating and hematuria.  Musculoskeletal:  Positive for myalgias and neck pain. Negative for gait problem, joint swelling and neck stiffness.  Skin:  Negative for pallor and rash.  Neurological:  Negative for syncope and headaches.  Psychiatric/Behavioral:  Negative for agitation and confusion.     Physical Exam Updated Vital Signs BP 131/89   Pulse 89   Temp 99.8 F (37.7 C) (Oral)   Resp 16    SpO2 100%  Physical Exam Vitals and nursing note reviewed.  Constitutional:      General: He is not in acute distress.    Appearance: He is well-developed.  HENT:     Head: Normocephalic and atraumatic.     Comments: Poor dentition    Right Ear: External ear normal.     Left Ear: External ear normal.     Mouth/Throat:     Mouth: Mucous membranes are moist.  Eyes:     General: No scleral icterus. Neck:      Comments: Tenderness to left side of his neck and left trapezius Cardiovascular:     Rate and Rhythm: Normal rate and regular rhythm.     Pulses: Normal pulses.     Heart sounds: Normal heart sounds.  Pulmonary:     Effort: Pulmonary effort is normal. No respiratory distress.     Breath sounds: Normal breath sounds.  Abdominal:     General: Abdomen is  flat.     Palpations: Abdomen is soft.     Tenderness: There is no abdominal tenderness. There is no guarding or rebound.  Musculoskeletal:        General: Normal range of motion.     Cervical back: Normal range of motion.     Right lower leg: No edema.     Left lower leg: No edema.  Skin:    General: Skin is warm and dry.     Capillary Refill: Capillary refill takes less than 2 seconds.  Neurological:     Mental Status: He is alert and oriented to person, place, and time.     GCS: GCS eye subscore is 4. GCS verbal subscore is 5. GCS motor subscore is 6.     Cranial Nerves: Cranial nerves 2-12 are intact.     Sensory: Sensation is intact.     Motor: Motor function is intact.     Coordination: Coordination is intact.  Psychiatric:        Mood and Affect: Mood normal.        Behavior: Behavior normal.     ED Results / Procedures / Treatments   Labs (all labs ordered are listed, but only abnormal results are displayed) Labs Reviewed - No data to display  EKG None  Radiology No results found.  Procedures Procedures  {Document cardiac monitor, telemetry assessment procedure when appropriate:1}  Medications  Ordered in ED Medications  diazepam (VALIUM) tablet 5 mg (has no administration in time range)  oxyCODONE-acetaminophen (PERCOCET/ROXICET) 5-325 MG per tablet 1 tablet (has no administration in time range)    ED Course/ Medical Decision Making/ A&P                           Medical Decision Making Risk Prescription drug management.   This patient presents to the ED with chief complaint(s) of left-sided neck and shoulder pain with pertinent past medical history of torticollis, seizures, hypertension which further complicates the presenting complaint. The complaint involves an extensive differential diagnosis and also carries with it a high risk of complications and morbidity.    The differential diagnosis includes but not limited to neck strain, MSK, sprain, torticollis, soft tissue infection, other acute etiologies were considered. Serious etiologies were considered.   The initial plan is to analgesics, x-ray   Additional history obtained: Additional history obtained from  n/a Records reviewed [place] and recent ED visits, prior labs and imaging  Independent labs interpretation:  The following labs were independently interpreted: n/a  Independent visualization of imaging: - I independently visualized the following imaging with scope of interpretation limited to determining acute life threatening conditions related to emergency care: left shoulder XR, which revealed ***  Cardiac monitoring was reviewed and interpreted by myself which shows n/a  Treatment and Reassessment: Analgesics, valium   Consultation: - Consulted or discussed management/test interpretation w/ external professional: ***  Consideration for admission or further workup: Admission was considered ***  Social Determinants of health: Social History   Tobacco Use   Smoking status: Every Day    Packs/day: 0.25    Types: Cigarettes   Smokeless tobacco: Never  Vaping Use   Vaping Use: Never  used  Substance Use Topics   Alcohol use: Yes    Comment: one 40 oz beer each day   Drug use: Yes    Types: Cocaine    Comment: cocaine--weekly per pt; last time couple days ago      {  Document critical care time when appropriate:1} {Document review of labs and clinical decision tools ie heart score, Chads2Vasc2 etc:1}  {Document your independent review of radiology images, and any outside records:1} {Document your discussion with family members, caretakers, and with consultants:1} {Document social determinants of health affecting pt's care:1} {Document your decision making why or why not admission, treatments were needed:1} Final Clinical Impression(s) / ED Diagnoses Final diagnoses:  None    Rx / DC Orders ED Discharge Orders     None

## 2021-12-05 NOTE — ED Notes (Signed)
Patient transported to X-ray 

## 2021-12-05 NOTE — Discharge Instructions (Signed)
It was a pleasure caring for you today in the emergency department. ° °Please return to the emergency department for any worsening or worrisome symptoms. ° ° °

## 2022-02-13 ENCOUNTER — Other Ambulatory Visit: Payer: Self-pay | Admitting: Neurology

## 2022-02-13 DIAGNOSIS — G40209 Localization-related (focal) (partial) symptomatic epilepsy and epileptic syndromes with complex partial seizures, not intractable, without status epilepticus: Secondary | ICD-10-CM

## 2022-02-17 ENCOUNTER — Other Ambulatory Visit: Payer: Self-pay | Admitting: Neurology

## 2022-02-18 NOTE — Telephone Encounter (Signed)
Patient has follow up in 05/2022

## 2022-03-23 ENCOUNTER — Other Ambulatory Visit: Payer: Self-pay | Admitting: Neurology

## 2022-03-23 DIAGNOSIS — G40209 Localization-related (focal) (partial) symptomatic epilepsy and epileptic syndromes with complex partial seizures, not intractable, without status epilepticus: Secondary | ICD-10-CM

## 2022-05-14 ENCOUNTER — Other Ambulatory Visit: Payer: Self-pay | Admitting: Neurology

## 2022-05-14 DIAGNOSIS — G40209 Localization-related (focal) (partial) symptomatic epilepsy and epileptic syndromes with complex partial seizures, not intractable, without status epilepticus: Secondary | ICD-10-CM

## 2022-06-09 ENCOUNTER — Other Ambulatory Visit: Payer: Self-pay | Admitting: Neurology

## 2022-06-16 NOTE — Progress Notes (Deleted)
NEUROLOGY FOLLOW UP OFFICE NOTE  CEPEDA FRANQUI JS:[id number]  Assessment/Plan:   Focal onset seizures with impaired consciousness  Depakote ER 2059m twice daily; Keppra 7531mtwice daily; Dilantin 20055mn AM and 26m75m PM. 2.  Calcium and vitamin D 3.  Check CBC, CMP, phenytoin level, levetiracetam level and valproic acid level. 4.  Follow up one year  Subjective:  [name] 66 y24r old right-handed male with diabetes mellitus and right eye blindness who follows up for localization-related epilepsy..   Marland KitchenPDATE: Medications: Depakote ER 2000 mg twice daily, Keppra 750 mg twice daily, Dilantin 200 mg in a.m. and 50 mg in p.m   Last seizure:  March 2019 (in setting of medication noncompliance).  He reports one 60 second "aura" that occurred last week.  He says he has been compliant with his medications.   He is doing well.    HISTORY: He began having seizures at age 23.  66t may have been precipitated by a head injury after falling out of his crib.     Semiology:  Aura of colored lights in right visual field.  It may last several minutes.  Usually the warning of the aura gives him time to lay down and call the ambulance.  Sometimes it resolves, while other times it progresses to a full-blown seizure, described as loss of consciousness, generalized jerking and tongue biting.  He averages approximately 1-2 seizures per year.  But he says last seizure was around 1 to 1.5 years ago.   [facility] because he began experiencing his habitual aura prior to his seizure, specifically flashing lights.  He called EMS and was transported to the hospital.  While on the stretcher, he did have a tonic-clonic seizure.  He was given 2 mg of Ativan.  He reportedly had been drinking alcohol.  He reported medication compliance.  However, valproic acid level was undetectable and Keppra level was low at 1.5.  Dilantin level was 7.3.  EtOH level was 14.  Basic  metabolic panel showed a slight anion gap of 17.  CBC was unremarkable.  He was discharged once stable.   He takes Depakote ER 2000mg72mce daily, phenytoin 200mg 39mM and 26mg a87mght, Keppra 726mg tw77mdaily.     Prior medication:  phenobarbital.  Higher doses of Dilantin caused ataxia.   09/20/09 MRI Brain w/o:  mild generalized atrophy with prominence to the cerebellum.  Post-operative changes of right globe.  06/11/2021 LABS:  Valproic Acid level 70.1, phenytoin level 9.5, levetiracetam level 40.8  PAST MEDICAL HISTORY: Past Medical History:  Diagnosis Date   Acute gastric ulcer without hemorrhage or perforation    Atherosclerosis of aorta ([facility]) 4/1[place]022   Blindness of right eye    Cocaine abuse ([facility])    Aubreyonary artery calcification seen on CAT scan 08/05/2020   CTA Chest & AP 08/04/20: Severe proximal left anterior descending and left circumflex coronary arterial atherosclerotic calcifications   Diabetes mellitus    Epilepsy ([facility])    Aurorareported by patient   Gastric ulcer due to Helicobacter pylori 4/12/202123456ACH, ANTRUM AND BODY, BIOPSY:  Gastric antral and oxyntic mucosa with Helicobacter pylori-associated gastritis (confirmed with Warthin Starry stain)    Hypertension 01/24/2017   Localization-related symptomatic epilepsy and epileptic syndromes with complex partial seizures, not intractable, without status epilepticus ([facility]) 10/[place]016   LUNG ABSCESS 03/14/2008   Qualifier: Diagnosis of  By: Wert MD,Melvyn NovashaelLegrand Como  B    LVH (left ventricular hypertrophy) 08/05/2020   transthoracic echocardiogram 08/05/20: moderate asymmetric left ventricular hypertrophy of the basal-septal segment. Left ventricular diastolic parameters are consistent with Grade I diastolic dysfunction    Multiple gallstones 08/05/2020   CTA 08/04/20: multiple peripherally calcified infundibular gallstones   Overdose 01/24/2017   Sciatica of right side 04/30/2016   Seizures ([place])    since age 60;last seizure 3-4  years ago per pt   Stroke ([place]) 2 years ago   Tobacco dependence with current use 08/05/2020    MEDICATIONS: Current Outpatient Medications on File Prior to Visit  Medication Sig Dispense Refill   acetaminophen (TYLENOL) 325 MG tablet Take 2 tablets (650 mg total) by mouth every 6 (six) hours as needed. 36 tablet 0   amLODipine (NORVASC) 10 MG tablet TAKE 1 TABLET (10 MG TOTAL) BY MOUTH DAILY. 90 tablet 1   bismuth-metronidazole-tetracycline (PYLERA) 140-125-125 MG capsule Take 3 capsules by mouth 4 (four) times daily -  before meals and at bedtime. 120 capsule 0   Cholecalciferol (VITAMIN D) 50 MCG (2000 UT) tablet Take 2,000 Units by mouth daily.     divalproex (DEPAKOTE ER) 500 MG 24 hr tablet TAKE 4 TABLETS BY MOUTH TWICE DAILY 720 tablet 0   famotidine (PEPCID) 20 MG tablet Take 1 tablet (20 mg total) by mouth 2 (two) times daily as needed for heartburn or indigestion. 30 tablet 0   ibuprofen (ADVIL) 600 MG tablet Take 1 tablet (600 mg total) by mouth every 6 (six) hours as needed (take with food). 30 tablet 0   levETIRAcetam (KEPPRA) 750 MG tablet TAKE 1 TABLET (750 MG TOTAL) BY MOUTH 2 (TWO) TIMES DAILY. 180 tablet 0   lidocaine (LIDODERM) 5 % Place 1 patch onto the skin daily as needed. Remove & Discard patch within 12 hours or as directed by MD 15 patch 0   pantoprazole (PROTONIX) 40 MG tablet TAKE 1 TABLET (40 MG TOTAL) BY MOUTH 2 (TWO) TIMES DAILY. 60 tablet 0   phenytoin (DILANTIN) 100 MG ER capsule TAKE 2 CAPSULES (200 MG TOTAL) BY MOUTH IN THE MORNING. 180 capsule 0   phenytoin (DILANTIN) 50 MG tablet CHEW 1 TABLET (50 MG TOTAL) BY MOUTH AT BEDTIME. 90 tablet 0   Current Facility-Administered Medications on File Prior to Visit  Medication Dose Route Frequency Provider Last Rate Last Admin   0.9 %  sodium chloride infusion  500 mL Intravenous Once Danis, Estill Cotta III, MD        ALLERGIES: Allergies  Allergen Reactions   Mobic [Meloxicam] Nausea Only    "pain all over"     FAMILY HISTORY: Family History  Problem Relation Age of Onset   Diabetes Mother    Hypertension Mother    Colon cancer Neg Hx    Pancreatic cancer Neg Hx    Liver cancer Neg Hx    Esophageal cancer Neg Hx       Objective:  *** General: No acute distress.  Patient appears well-groomed.   Head:  Normocephalic/atraumatic Eyes:  Fundi examined but not visualized Neck: supple, no paraspinal tenderness, full range of motion Heart:  Regular rate and rhythm Neurological Exam: alert and oriented to person, place, and time.  Speech fluent and not dysarthric, language intact.  Blind in right eye, scarring of right eye.  No ocular movement of right eye.  Otherwise, CN II-XII intact. Bulk and tone normal, muscle strength 5/5 throughout.  Sensation to light touch intact.  Deep tendon  reflexes 2+ throughout.  Finger to nose testing intact.  Gait normal, Romberg negative.    Metta Clines, DO  CC: Juluis Mire, NP

## 2022-06-17 ENCOUNTER — Encounter: Payer: Self-pay | Admitting: Neurology

## 2022-06-17 ENCOUNTER — Ambulatory Visit: Payer: Medicaid Other | Admitting: Neurology

## 2022-07-07 NOTE — Progress Notes (Deleted)
 NEUROLOGY FOLLOW UP OFFICE NOTE  [name] [id number]  Assessment/Plan:   Focal onset seizures with impaired consciousness  Depakote ER 2000mg twice daily; Keppra 750mg twice daily; Dilantin 200mg in AM and 50mg in PM. 2.  Calcium and vitamin D 3.  Check CBC, CMP, phenytoin level, levetiracetam level and valproic acid level. 4.  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   UPDATE: Medications: Depakote ER 2000 mg twice daily, Keppra 750 mg twice daily, Dilantin 200 mg in a.m. and 50 mg in p.m   Last seizure:  March 2019 (in setting of medication noncompliance).  He reports one 60 second "aura" that occurred last week.  He says he has been compliant with his medications.   He is doing well.    HISTORY: He began having seizures at age 2.  It may have been precipitated by a head injury after falling out of his crib.     Semiology:  Aura of colored lights in right visual field.  It may last several minutes.  Usually the warning of the aura gives him time to lay down and call the ambulance.  Sometimes it resolves, while other times it progresses to a full-blown seizure, described as loss of consciousness, generalized jerking and tongue biting.  He averages approximately 1-2 seizures per year.  But he says last seizure was around 1 to 1.5 years ago.   He presented to the ED at [facility] on 07/03/2017 because he began experiencing his habitual aura prior to his seizure, specifically flashing lights.  He called EMS and was transported to the hospital.  While on the stretcher, he did have a tonic-clonic seizure.  He was given 2 mg of Ativan.  He reportedly had been drinking alcohol.  He reported medication compliance.  However, valproic acid level was undetectable and Keppra level was low at 1.5.  Dilantin level was 7.3.  EtOH level was 14.  Basic  metabolic panel showed a slight anion gap of 17.  CBC was unremarkable.  He was discharged once stable.   He takes Depakote ER 2000mg twice daily, phenytoin 200mg in AM and 50mg at night, Keppra 750mg twice daily.     Prior medication:  phenobarbital.  Higher doses of Dilantin caused ataxia.   09/20/09 MRI Brain w/o:  mild generalized atrophy with prominence to the cerebellum.  Post-operative changes of right globe.  06/11/2021 LABS:  Valproic Acid level 70.1, phenytoin level 9.5, levetiracetam level 40.8  PAST MEDICAL HISTORY: Past Medical History:  Diagnosis Date   Acute gastric ulcer without hemorrhage or perforation    Atherosclerosis of aorta (HCC) 08/05/2020   Blindness of right eye    Cocaine abuse (HCC)    Coronary artery calcification seen on CAT scan 08/05/2020   CTA Chest & AP 08/04/20: Severe proximal left anterior descending and left circumflex coronary arterial atherosclerotic calcifications   Diabetes mellitus    Epilepsy (HCC)    As reported by patient   Gastric ulcer due to Helicobacter pylori 08/06/2020    STOMACH, ANTRUM AND BODY, BIOPSY:  Gastric antral and oxyntic mucosa with Helicobacter pylori-associated gastritis (confirmed with Warthin Starry stain)    Hypertension 01/24/2017   Localization-related symptomatic epilepsy and epileptic syndromes with complex partial seizures, not intractable, without status epilepticus (HCC) 01/29/2015   LUNG ABSCESS 03/14/2008   Qualifier: Diagnosis of  By: Wert MD, Michael   B    LVH (left ventricular hypertrophy) 08/05/2020   transthoracic echocardiogram 08/05/20: moderate asymmetric left ventricular hypertrophy of the basal-septal segment. Left ventricular diastolic parameters are consistent with Grade I diastolic dysfunction    Multiple gallstones 08/05/2020   CTA 08/04/20: multiple peripherally calcified infundibular gallstones   Overdose 01/24/2017   Sciatica of right side 04/30/2016   Seizures (HCC)    since age 2;last seizure 3-4  years ago per pt   Stroke (HCC) 2 years ago   Tobacco dependence with current use 08/05/2020    MEDICATIONS: Current Outpatient Medications on File Prior to Visit  Medication Sig Dispense Refill   acetaminophen (TYLENOL) 325 MG tablet Take 2 tablets (650 mg total) by mouth every 6 (six) hours as needed. 36 tablet 0   amLODipine (NORVASC) 10 MG tablet TAKE 1 TABLET (10 MG TOTAL) BY MOUTH DAILY. 90 tablet 1   bismuth-metronidazole-tetracycline (PYLERA) 140-125-125 MG capsule Take 3 capsules by mouth 4 (four) times daily -  before meals and at bedtime. 120 capsule 0   Cholecalciferol (VITAMIN D) 50 MCG (2000 UT) tablet Take 2,000 Units by mouth daily.     divalproex (DEPAKOTE ER) 500 MG 24 hr tablet TAKE 4 TABLETS BY MOUTH TWICE DAILY 720 tablet 0   famotidine (PEPCID) 20 MG tablet Take 1 tablet (20 mg total) by mouth 2 (two) times daily as needed for heartburn or indigestion. 30 tablet 0   ibuprofen (ADVIL) 600 MG tablet Take 1 tablet (600 mg total) by mouth every 6 (six) hours as needed (take with food). 30 tablet 0   levETIRAcetam (KEPPRA) 750 MG tablet TAKE 1 TABLET (750 MG TOTAL) BY MOUTH 2 (TWO) TIMES DAILY. 180 tablet 0   lidocaine (LIDODERM) 5 % Place 1 patch onto the skin daily as needed. Remove & Discard patch within 12 hours or as directed by MD 15 patch 0   pantoprazole (PROTONIX) 40 MG tablet TAKE 1 TABLET (40 MG TOTAL) BY MOUTH 2 (TWO) TIMES DAILY. 60 tablet 0   phenytoin (DILANTIN) 100 MG ER capsule TAKE 2 CAPSULES (200 MG TOTAL) BY MOUTH IN THE MORNING. 180 capsule 0   phenytoin (DILANTIN) 50 MG tablet CHEW 1 TABLET (50 MG TOTAL) BY MOUTH AT BEDTIME. 90 tablet 0   Current Facility-Administered Medications on File Prior to Visit  Medication Dose Route Frequency Provider Last Rate Last Admin   0.9 %  sodium chloride infusion  500 mL Intravenous Once Danis, Henry L III, MD        ALLERGIES: Allergies  Allergen Reactions   Mobic [Meloxicam] Nausea Only    "pain all over"     FAMILY HISTORY: Family History  Problem Relation Age of Onset   Diabetes Mother    Hypertension Mother    Colon cancer Neg Hx    Pancreatic cancer Neg Hx    Liver cancer Neg Hx    Esophageal cancer Neg Hx       Objective:  *** General: No acute distress.  Patient appears well-groomed.   Head:  Normocephalic/atraumatic Eyes:  Fundi examined but not visualized Neck: supple, no paraspinal tenderness, full range of motion Heart:  Regular rate and rhythm Neurological Exam: alert and oriented to person, place, and time.  Speech fluent and not dysarthric, language intact.  Blind in right eye, scarring of right eye.  No ocular movement of right eye.  Otherwise, CN II-XII intact. Bulk and tone normal, muscle strength 5/5 throughout.  Sensation to light touch intact.  Deep tendon   reflexes 2+ throughout.  Finger to nose testing intact.  Gait normal, Romberg negative.    Avannah Decker, DO  CC: Michelle Edwards, NP      

## 2022-07-08 ENCOUNTER — Encounter: Payer: Self-pay | Admitting: Neurology

## 2022-07-08 ENCOUNTER — Ambulatory Visit: Payer: Medicare HMO | Admitting: Neurology

## 2022-07-22 ENCOUNTER — Emergency Department (HOSPITAL_COMMUNITY): Payer: Medicare HMO

## 2022-07-22 ENCOUNTER — Other Ambulatory Visit: Payer: Self-pay

## 2022-07-22 ENCOUNTER — Emergency Department (HOSPITAL_COMMUNITY)
Admission: EM | Admit: 2022-07-22 | Discharge: 2022-07-23 | Discharge status: Against Medical Advice | Payer: Medicare HMO | Attending: Emergency Medicine | Admitting: Emergency Medicine

## 2022-07-22 DIAGNOSIS — M79605 Pain in left leg: Secondary | ICD-10-CM | POA: Insufficient documentation

## 2022-07-22 DIAGNOSIS — B882 Other arthropod infestations: Secondary | ICD-10-CM | POA: Insufficient documentation

## 2022-07-22 DIAGNOSIS — M545 Low back pain, unspecified: Secondary | ICD-10-CM

## 2022-07-22 DIAGNOSIS — M79604 Pain in right leg: Secondary | ICD-10-CM | POA: Diagnosis not present

## 2022-07-22 LAB — CBC
HCT: 38.7 % — ABNORMAL LOW (ref 39.0–52.0)
Hemoglobin: 12.9 g/dL — ABNORMAL LOW (ref 13.0–17.0)
MCH: 31.4 pg (ref 26.0–34.0)
MCHC: 33.3 g/dL (ref 30.0–36.0)
MCV: 94.2 fL (ref 80.0–100.0)
Platelets: 249 10*3/uL (ref 150–400)
RBC: 4.11 MIL/uL — ABNORMAL LOW (ref 4.22–5.81)
RDW: 15.1 % (ref 11.5–15.5)
WBC: 8.6 10*3/uL (ref 4.0–10.5)
nRBC: 0 % (ref 0.0–0.2)

## 2022-07-22 LAB — BASIC METABOLIC PANEL
Anion gap: 9 (ref 5–15)
BUN: 23 mg/dL (ref 8–23)
CO2: 25 mmol/L (ref 22–32)
Calcium: 8.8 mg/dL — ABNORMAL LOW (ref 8.9–10.3)
Chloride: 107 mmol/L (ref 98–111)
Creatinine, Ser: 0.78 mg/dL (ref 0.61–1.24)
GFR, Estimated: >60 mL/min (ref >60)
Glucose, Bld: 74 mg/dL (ref 70–99)
Potassium: 4 mmol/L (ref 3.5–5.1)
Sodium: 141 mmol/L (ref 135–145)

## 2022-07-22 LAB — VALPROIC ACID LEVEL: Valproic Acid Lvl: 51 ug/mL (ref 50.0–100.0)

## 2022-07-22 MED ORDER — LEVETIRACETAM 500 MG PO TABS
750.0000 mg | ORAL_TABLET | Freq: Once | ORAL | Status: AC
  Administered 2022-07-22: 750 mg via ORAL
  Filled 2022-07-22: qty 1

## 2022-07-22 MED ORDER — DIVALPROEX SODIUM ER 500 MG PO TB24
2000.0000 mg | ORAL_TABLET | Freq: Once | ORAL | Status: AC
  Administered 2022-07-22: 2000 mg via ORAL
  Filled 2022-07-22: qty 4

## 2022-07-22 MED ORDER — METHYLPREDNISOLONE SODIUM SUCC 125 MG IJ SOLR
125.0000 mg | Freq: Once | INTRAMUSCULAR | Status: AC
  Administered 2022-07-22: 125 mg via INTRAVENOUS
  Filled 2022-07-22: qty 2

## 2022-07-22 MED ORDER — LACTATED RINGERS IV BOLUS
500.0000 mL | Freq: Once | INTRAVENOUS | Status: AC
  Administered 2022-07-22: 500 mL via INTRAVENOUS

## 2022-07-22 MED ORDER — KETOROLAC TROMETHAMINE 30 MG/ML IJ SOLN
30.0000 mg | Freq: Once | INTRAMUSCULAR | Status: AC
  Administered 2022-07-22: 30 mg via INTRAVENOUS
  Filled 2022-07-22: qty 1

## 2022-07-22 MED ORDER — PHENYTOIN 50 MG PO CHEW
50.0000 mg | CHEWABLE_TABLET | Freq: Once | ORAL | Status: DC
  Filled 2022-07-22: qty 1

## 2022-07-22 MED ORDER — PHENYTOIN 50 MG PO CHEW
200.0000 mg | CHEWABLE_TABLET | Freq: Once | ORAL | Status: AC
  Administered 2022-07-22: 200 mg via ORAL
  Filled 2022-07-22: qty 4

## 2022-07-22 MED ORDER — MORPHINE SULFATE (PF) 2 MG/ML IV SOLN
2.0000 mg | Freq: Once | INTRAVENOUS | Status: AC
  Administered 2022-07-22: 2 mg via INTRAVENOUS
  Filled 2022-07-22: qty 1

## 2022-07-22 NOTE — ED Provider Notes (Signed)
Assumed care of patient from off-going team. For more details, please see note from same day.  In brief, this is a 67 y.o. male, for more information please see note from same day Presents with BL leg pain and back pain, but found to have bed bugs.   Plan/Dispo at time of sign-out & ED Course since sign-out: [ ]  CT after shower  BP 137/72   Pulse 60   Temp 98.3 F (36.8 C)   Resp 16   SpO2 97%    ED Course:   Clinical Course as of 07/22/22 2322  Wed Jul 22, 2022  2232 Have showered patient but patient was brought back to dirty sheets. CT came to take patient but bed bugs observed in the sheets so CT won't take him. Will attempt to shower patient again and go straight to CT. [HN]  2313 I discussed with patient who is extremely frustrated at the situation. He states he needs his seizure medications or he will have a seizure. I ordered his home meds per his request.  I discussed with nursing and patient about making sure that he showers again and get to clean rooms that he is able to go to CT.  CT states that they cannot risk getting bedbugs and CT and having to shut down the CT scanner.  Patient states that he refuses to shower again because he already showered once.  Patient be leaving AMA.  Patient reports understanding of risks of leaving but refuses to shower again.  [HN]    Clinical Course User Index [HN] Audley Hose, MD    Dispo: AMA ------------------------------- Cindee Lame, MD Emergency Medicine  This note was created using dictation software, which may contain spelling or grammatical errors.   Audley Hose, MD 07/22/22 [phone]

## 2022-07-22 NOTE — ED Triage Notes (Signed)
Pt bib ems from home c/o shooting pain both legs x 2 days, ambulatory, no injury. Ao x 4. Per EMS, pt has bed bugs.  128/72 HR 88 98% RA

## 2022-07-22 NOTE — ED Provider Notes (Signed)
[facility] Provider Note   CSN: [id number] Arrival date & time: 07/22/22  1334     History {Add pertinent medical, surgical, social history, OB history to HPI:1} Chief Complaint  Patient presents with   Leg Pain    [name] is a 67 y.o. male.  HPI 67 year old male presents today complaining of bilateral leg pain and low back pain.  He denies any injury.  He denies any history of IV drug abuse, blood thinners, or previous back problems or infections.  He states he is having difficulty moving his legs due to pain.  Pain starts at about the knees and radiates up to the low back area.  He denies any fever, chills, nausea, or vomiting.  EMS reports that they were able to move him from the bed in the house to the stretcher with him walking hunched over.  He states that he is having pain in his legs but is not having weakness and is able to move his legs tied side-to-side without difficulty  He denies any loss of bowel or bladder control or change in sensation  Home Medications Prior to Admission medications   Medication Sig Start Date End Date Taking? Authorizing Provider  acetaminophen (TYLENOL) 325 MG tablet Take 2 tablets (650 mg total) by mouth every 6 (six) hours as needed. 12/05/21   Jeanell Sparrow, DO  amLODipine (NORVASC) 10 MG tablet TAKE 1 TABLET (10 MG TOTAL) BY MOUTH DAILY. 02/13/21   Kerin Perna, NP  bismuth-metronidazole-tetracycline [facility]) [phone] MG capsule Take 3 capsules by mouth 4 (four) times daily -  before meals and at bedtime. 08/13/20   Pyrtle, Lajuan Lines, MD  Cholecalciferol (VITAMIN D) 50 MCG (2000 UT) tablet Take 2,000 Units by mouth daily. 07/24/20   [provider]  divalproex (DEPAKOTE ER) 500 MG 24 hr tablet TAKE 4 TABLETS BY MOUTH TWICE DAILY 03/23/22   Tomi Likens, Adam R, DO  famotidine (PEPCID) 20 MG tablet Take 1 tablet (20 mg total) by mouth 2 (two) times daily as needed for heartburn or  indigestion. 08/06/20   Zola Button, MD  ibuprofen (ADVIL) 600 MG tablet Take 1 tablet (600 mg total) by mouth every 6 (six) hours as needed (take with food). 12/05/21   Jeanell Sparrow, DO  levETIRAcetam (KEPPRA) 750 MG tablet TAKE 1 TABLET (750 MG TOTAL) BY MOUTH 2 (TWO) TIMES DAILY. 02/18/22   Tomi Likens, Adam R, DO  lidocaine (LIDODERM) 5 % Place 1 patch onto the skin daily as needed. Remove & Discard patch within 12 hours or as directed by MD 12/05/21   Wynona Dove A, DO  pantoprazole (PROTONIX) 40 MG tablet TAKE 1 TABLET (40 MG TOTAL) BY MOUTH 2 (TWO) TIMES DAILY. 01/06/21   Doran Stabler, MD  phenytoin (DILANTIN) 100 MG ER capsule TAKE 2 CAPSULES (200 MG TOTAL) BY MOUTH IN THE MORNING. 05/15/22   Tomi Likens, Adam R, DO  phenytoin (DILANTIN) 50 MG tablet CHEW 1 TABLET (50 MG TOTAL) BY MOUTH AT BEDTIME. 06/12/22   Pieter Partridge, DO      Allergies    Mobic [meloxicam]    Review of Systems   Review of Systems  Physical Exam Updated Vital Signs BP (!) 140/91   Pulse 70   Temp 98.3 F (36.8 C)   Resp 17   SpO2 96%  Physical Exam Vitals reviewed.  HENT:     Head: Normocephalic.     Right Ear: External ear  normal.     Left Ear: External ear normal.     Nose: Nose normal.     Mouth/Throat:     Pharynx: Oropharynx is clear.  Eyes:     Pupils: Pupils are equal, round, and reactive to light.  Cardiovascular:     Rate and Rhythm: Normal rate and regular rhythm.     Pulses: Normal pulses.  Pulmonary:     Effort: Pulmonary effort is normal.     Breath sounds: Normal breath sounds.  Abdominal:     General: Abdomen is flat.  Musculoskeletal:        General: Normal range of motion.     Cervical back: Normal range of motion.  Skin:    General: Skin is warm.     Capillary Refill: Capillary refill takes less than 2 seconds.  Neurological:     General: No focal deficit present.     Mental Status: He is alert.  Psychiatric:        Mood and Affect: Mood normal.        Behavior: Behavior  normal.     ED Results / Procedures / Treatments   Labs (all labs ordered are listed, but only abnormal results are displayed) Labs Reviewed  CBC  BASIC METABOLIC PANEL  VALPROIC ACID LEVEL    EKG None  Radiology No results found.  Procedures Procedures  {Document cardiac monitor, telemetry assessment procedure when appropriate:1}  Medications Ordered in ED Medications - No data to display  ED Course/ Medical Decision Making/ A&P   {   Click here for ABCD2, HEART and other calculatorsREFRESH Note before signing :1}                          Medical Decision Making Amount and/or Complexity of Data Reviewed Labs: ordered. Radiology: ordered.  Risk Prescription drug management.     {Document critical care time when appropriate:1} {Document review of labs and clinical decision tools ie heart score, Chads2Vasc2 etc:1}  {Document your independent review of radiology images, and any outside records:1} {Document your discussion with family members, caretakers, and with consultants:1} {Document social determinants of health affecting pt's care:1} {Document your decision making why or why not admission, treatments were needed:1} Final Clinical Impression(s) / ED Diagnoses Final diagnoses:  None    Rx / DC Orders ED Discharge Orders     None

## 2022-07-22 NOTE — ED Notes (Signed)
Pt refused to sign AMA form.

## 2022-07-22 NOTE — Discharge Instructions (Addendum)
Thank you for coming to [facility] Emergency Department. You were seen for back and leg pain. We wanted to perform CT scan but had difficulty because of your bed bugs. You have accepted the risks of leaving and are choosing to leave against medical advice.  Please return to the emergency department. If you choose not to return to the ED, please follow up with your primary care provider as soon as possible within 1 week.   Return to the ED if you develop any of the following: - Fever (100.4 F or 38 C) or chills at home that do not respond to over the counter medications - Weakness, numbness, or tingling in your extremities - Difficulty emptying bladder / urinary incontinence - Fecal incontinence - Uncontrolled nausea/vomiting with inability to keep down liquids - Feeling as though you are going to pass out or passing out - Anything else that concerns you

## 2022-08-10 ENCOUNTER — Other Ambulatory Visit: Payer: Self-pay | Admitting: Neurology

## 2022-10-01 ENCOUNTER — Other Ambulatory Visit: Payer: Self-pay | Admitting: Neurology

## 2022-10-01 DIAGNOSIS — G40209 Localization-related (focal) (partial) symptomatic epilepsy and epileptic syndromes with complex partial seizures, not intractable, without status epilepticus: Secondary | ICD-10-CM

## 2022-11-02 ENCOUNTER — Other Ambulatory Visit: Payer: Self-pay | Admitting: Neurology

## 2022-11-02 DIAGNOSIS — G40209 Localization-related (focal) (partial) symptomatic epilepsy and epileptic syndromes with complex partial seizures, not intractable, without status epilepticus: Secondary | ICD-10-CM

## 2023-01-01 NOTE — Progress Notes (Deleted)
NEUROLOGY FOLLOW UP OFFICE NOTE  KIRTIS AU [id number]  Assessment/Plan:   Focal onset seizures with impaired consciousness  Depakote ER 2000mg  twice daily; Keppra 750mg  twice daily; Dilantin 200mg  in AM and 50mg  in PM. 2.  Calcium and vitamin D 3.  Check CBC, CMP, phenytoin level, levetiracetam level and valproic acid level. 4.  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 and 50 mg in p.m   Last seizure:  March 2019 (in setting of medication noncompliance).  He reports one 60 second "aura" that occurred last week.  He says he has been compliant with his medications.   He is doing well.   07/22/2022 LABS:  CBC with WBC 8.6, HGB 12.9, HCT 38.7, PLT 249; BMP with Na 141, K 4, Cl 107, CO2 25, glucose 74, Ca 8.8, BUN 23, Cr 0.78, GFR >60, VPA level 51.   HISTORY: He began having seizures at age 20.  It may have been precipitated by a head injury after falling out of his crib.     Semiology:  Aura of colored lights in right visual field.  It may last several minutes.  Usually the warning of the aura gives him time to lay down and call the ambulance.  Sometimes it resolves, while other times it progresses to a full-blown seizure, described as loss of consciousness, generalized jerking and tongue biting.  He averages approximately 1-2 seizures per year.  But he says last seizure was around 1 to 1.5 years ago.   He presented to the ED at [facility] on 07/03/2017 because he began experiencing his habitual aura prior to his seizure, specifically flashing lights.  He called EMS and was transported to the hospital.  While on the stretcher, he did have a tonic-clonic seizure.  He was given 2 mg of Ativan.  He reportedly had been drinking alcohol.  He  reported medication compliance.  However, valproic acid level was undetectable and Keppra level was low at 1.5.  Dilantin level was 7.3.  EtOH level was 14.  Basic metabolic panel showed a slight anion gap of 17.  CBC was unremarkable.  He was discharged once stable.   He takes Depakote ER 2000mg  twice daily, phenytoin 200mg  in AM and 50mg  at night, Keppra 750mg  twice daily.     Prior medication:  phenobarbital.  Higher doses of Dilantin caused ataxia.   09/20/09 MRI Brain w/o:  mild generalized atrophy with prominence to the cerebellum.  Post-operative changes of right globe.  PAST MEDICAL HISTORY: Past Medical History:  Diagnosis Date   Acute gastric ulcer without hemorrhage or perforation    Atherosclerosis of aorta (HCC) 08/05/2020   Blindness of right eye    Cocaine abuse (HCC)    Coronary artery calcification seen on CAT scan 08/05/2020   CTA Chest & AP 08/04/20: Severe proximal left anterior descending and left circumflex coronary arterial atherosclerotic calcifications   Diabetes mellitus    Epilepsy (HCC)    As reported by patient   Gastric ulcer due to Helicobacter pylori 08/06/2020    STOMACH, ANTRUM AND BODY, BIOPSY:  Gastric antral and oxyntic mucosa with Helicobacter pylori-associated gastritis (confirmed with Warthin Starry stain)    Hypertension 01/24/2017   Localization-related symptomatic epilepsy and epileptic syndromes with complex partial  seizures, not intractable, without status epilepticus (HCC) 01/29/2015   LUNG ABSCESS 03/14/2008   Qualifier: Diagnosis of  By: Sherene Sires MD, Casimiro Needle B    LVH (left ventricular hypertrophy) 08/05/2020   transthoracic echocardiogram 08/05/20: moderate asymmetric left ventricular hypertrophy of the basal-septal segment. Left ventricular diastolic parameters are consistent with Grade I diastolic dysfunction    Multiple gallstones 08/05/2020   CTA 08/04/20: multiple peripherally calcified infundibular gallstones   Overdose 01/24/2017   Sciatica of  right side 04/30/2016   Seizures (HCC)    since age 45;last seizure 3-4 years ago per pt   Stroke (HCC) 2 years ago   Tobacco dependence with current use 08/05/2020    MEDICATIONS: Current Outpatient Medications on File Prior to Visit  Medication Sig Dispense Refill   acetaminophen (TYLENOL) 325 MG tablet Take 2 tablets (650 mg total) by mouth every 6 (six) hours as needed. 36 tablet 0   amLODipine (NORVASC) 10 MG tablet TAKE 1 TABLET (10 MG TOTAL) BY MOUTH DAILY. 90 tablet 1   bismuth-metronidazole-tetracycline (PYLERA) 140-125-125 MG capsule Take 3 capsules by mouth 4 (four) times daily -  before meals and at bedtime. 120 capsule 0   Cholecalciferol (VITAMIN D) 50 MCG (2000 UT) tablet Take 2,000 Units by mouth daily.     divalproex (DEPAKOTE ER) 500 MG 24 hr tablet TAKE 4 TABLETS BY MOUTH TWICE DAILY 720 tablet 0   famotidine (PEPCID) 20 MG tablet Take 1 tablet (20 mg total) by mouth 2 (two) times daily as needed for heartburn or indigestion. 30 tablet 0   ibuprofen (ADVIL) 600 MG tablet Take 1 tablet (600 mg total) by mouth every 6 (six) hours as needed (take with food). 30 tablet 0   levETIRAcetam (KEPPRA) 750 MG tablet TAKE 1 TABLET (750 MG TOTAL) BY MOUTH 2 (TWO) TIMES DAILY. 180 tablet 0   lidocaine (LIDODERM) 5 % Place 1 patch onto the skin daily as needed. Remove & Discard patch within 12 hours or as directed by MD 15 patch 0   pantoprazole (PROTONIX) 40 MG tablet TAKE 1 TABLET (40 MG TOTAL) BY MOUTH 2 (TWO) TIMES DAILY. 60 tablet 0   phenytoin (DILANTIN) 100 MG ER capsule TAKE 2 CAPSULES (200 MG TOTAL) BY MOUTH IN THE MORNING. 180 capsule 0   phenytoin (DILANTIN) 50 MG tablet CHEW 1 TABLET (50 MG TOTAL) BY MOUTH AT BEDTIME. 90 tablet 0   Current Facility-Administered Medications on File Prior to Visit  Medication Dose Route Frequency Provider Last Rate Last Admin   0.9 %  sodium chloride infusion  500 mL Intravenous Once Danis, Starr [place] III, MD        ALLERGIES: Allergies  Allergen  Reactions   Mobic [Meloxicam] Nausea Only    "pain all over"    FAMILY HISTORY: Family History  Problem Relation Age of Onset   Diabetes Mother    Hypertension Mother    Colon cancer Neg Hx    Pancreatic cancer Neg Hx    Liver cancer Neg Hx    Esophageal cancer Neg Hx       Objective:  *** General: No acute distress.  Patient appears well-groomed.   Head:  Normocephalic/atraumatic Eyes:  Fundi examined but not visualized Neck: supple, no paraspinal tenderness, full range of motion Heart:  Regular rate and rhythm Neurological Exam: alert and oriented.  Speech fluent and not dysarthric, language intact.  Blind in right eye, no ocular movement in right eye.  Otherwise, CN II-XII intact. Bulk and tone normal,  muscle strength 5/5 throughout.  Sensation to light touch intact.  Deep tendon reflexes 2+ throughout.  Finger to nose testing intact.  Gait normal, Romberg negative.    Shon Millet, DO  CC: Gwinda Passe, NP

## 2023-01-03 ENCOUNTER — Encounter (HOSPITAL_COMMUNITY): Payer: Self-pay

## 2023-01-03 ENCOUNTER — Emergency Department (HOSPITAL_COMMUNITY)
Admission: EM | Admit: 2023-01-03 | Discharge: 2023-01-03 | Disposition: A | Discharge status: Home / Self Care | Payer: Medicare HMO | Attending: Emergency Medicine | Admitting: Emergency Medicine

## 2023-01-03 ENCOUNTER — Emergency Department (HOSPITAL_COMMUNITY): Payer: Medicare HMO

## 2023-01-03 ENCOUNTER — Other Ambulatory Visit: Payer: Self-pay

## 2023-01-03 DIAGNOSIS — G8929 Other chronic pain: Secondary | ICD-10-CM | POA: Diagnosis not present

## 2023-01-03 DIAGNOSIS — G40909 Epilepsy, unspecified, not intractable, without status epilepticus: Secondary | ICD-10-CM | POA: Insufficient documentation

## 2023-01-03 DIAGNOSIS — S20211A Contusion of right front wall of thorax, initial encounter: Secondary | ICD-10-CM | POA: Diagnosis not present

## 2023-01-03 DIAGNOSIS — W06XXXA Fall from bed, initial encounter: Secondary | ICD-10-CM | POA: Insufficient documentation

## 2023-01-03 DIAGNOSIS — M545 Low back pain, unspecified: Secondary | ICD-10-CM | POA: Insufficient documentation

## 2023-01-03 DIAGNOSIS — S299XXA Unspecified injury of thorax, initial encounter: Secondary | ICD-10-CM | POA: Diagnosis present

## 2023-01-03 LAB — COMPREHENSIVE METABOLIC PANEL
ALT: 14 U/L (ref 0–44)
AST: 16 U/L (ref 15–41)
Albumin: 3.5 g/dL (ref 3.5–5.0)
Alkaline Phosphatase: 60 U/L (ref 38–126)
Anion gap: 14 (ref 5–15)
BUN: 23 mg/dL (ref 8–23)
CO2: 21 mmol/L — ABNORMAL LOW (ref 22–32)
Calcium: 9.1 mg/dL (ref 8.9–10.3)
Chloride: 105 mmol/L (ref 98–111)
Creatinine, Ser: 0.63 mg/dL (ref 0.61–1.24)
GFR, Estimated: >60 mL/min (ref >60)
Glucose, Bld: 91 mg/dL (ref 70–99)
Potassium: 3.6 mmol/L (ref 3.5–5.1)
Sodium: 140 mmol/L (ref 135–145)
Total Bilirubin: 0.7 mg/dL (ref 0.3–1.2)
Total Protein: 7.5 g/dL (ref 6.5–8.1)

## 2023-01-03 LAB — URINALYSIS, ROUTINE W REFLEX MICROSCOPIC
Bacteria, UA: NONE SEEN
Bilirubin Urine: NEGATIVE
Glucose, UA: NEGATIVE mg/dL
Hgb urine dipstick: NEGATIVE
Ketones, ur: 20 mg/dL — AB
Leukocytes,Ua: NEGATIVE
Nitrite: NEGATIVE
Protein, ur: 30 mg/dL — AB
Specific Gravity, Urine: 1.041 — ABNORMAL HIGH (ref 1.005–1.030)
pH: 5 (ref 5.0–8.0)

## 2023-01-03 LAB — CBC WITH DIFFERENTIAL/PLATELET
Abs Immature Granulocytes: 0.04 10*3/uL (ref 0.00–0.07)
Basophils Absolute: 0.1 10*3/uL (ref 0.0–0.1)
Basophils Relative: 0 %
Eosinophils Absolute: 0 10*3/uL (ref 0.0–0.5)
Eosinophils Relative: 0 %
HCT: 43 % (ref 39.0–52.0)
Hemoglobin: 14 g/dL (ref 13.0–17.0)
Immature Granulocytes: 0 %
Lymphocytes Relative: 28 %
Lymphs Abs: 4 10*3/uL (ref 0.7–4.0)
MCH: 30.2 pg (ref 26.0–34.0)
MCHC: 32.6 g/dL (ref 30.0–36.0)
MCV: 92.7 fL (ref 80.0–100.0)
Monocytes Absolute: 1.5 10*3/uL — ABNORMAL HIGH (ref 0.1–1.0)
Monocytes Relative: 11 %
Neutro Abs: 8.4 10*3/uL — ABNORMAL HIGH (ref 1.7–7.7)
Neutrophils Relative %: 61 %
Platelets: 186 10*3/uL (ref 150–400)
RBC: 4.64 MIL/uL (ref 4.22–5.81)
RDW: 14 % (ref 11.5–15.5)
WBC: 14.1 10*3/uL — ABNORMAL HIGH (ref 4.0–10.5)
nRBC: 0 % (ref 0.0–0.2)

## 2023-01-03 LAB — LIPASE, BLOOD: Lipase: 22 U/L (ref 11–51)

## 2023-01-03 MED ORDER — METHOCARBAMOL 500 MG PO TABS: 500.0000 mg | ORAL_TABLET | Freq: Four times a day (QID) | ORAL | 0 refills | Status: DC

## 2023-01-03 MED ORDER — HYDROCODONE-ACETAMINOPHEN 5-325 MG PO TABS
2.0000 | ORAL_TABLET | Freq: Once | ORAL | Status: AC
  Administered 2023-01-03: 2 via ORAL
  Filled 2023-01-03: qty 2

## 2023-01-03 MED ORDER — HYDROCODONE-ACETAMINOPHEN 5-325 MG PO TABS: 1.0000 | ORAL_TABLET | ORAL | 0 refills | Status: AC | PRN

## 2023-01-03 NOTE — ED Provider Notes (Signed)
EMERGENCY DEPARTMENT AT [facility] Provider Note   CSN: [id number] Arrival date & time: 01/03/23  1026     History  Chief Complaint  Patient presents with   Back Pain    [name] is a 67 y.o. male.  Patient complains of pain to the right side of his chest.  Patient reports he has a seizure disorder and is concerned that he could have fallen out of bed and struck the right side of his chest.  Patient is worried that he could have a broken rib.  Patient reports he has a history of chronic back pain.  Patient reports he has pain in his right low back.  Patient reports that he normally has pain in this area but it seems worse than usual.  Patient noticed increased pain in this area 3 days ago while he was sitting and watching TV.  The history is provided by the patient. No language interpreter was used.  Back Pain Location:  Generalized Quality:  Aching Pain severity:  Moderate Onset quality:  Gradual Duration:  3 days Timing:  Constant Chronicity:  New Relieved by:  Nothing Worsened by:  Nothing Associated symptoms: no abdominal pain and no numbness        Home Medications Prior to Admission medications   Medication Sig Start Date End Date Taking? Authorizing Provider  acetaminophen (TYLENOL) 325 MG tablet Take 2 tablets (650 mg total) by mouth every 6 (six) hours as needed. 12/05/21   Sloan Leiter, DO  amLODipine (NORVASC) 10 MG tablet TAKE 1 TABLET (10 MG TOTAL) BY MOUTH DAILY. 02/13/21   Grayce Sessions, NP  bismuth-metronidazole-tetracycline [facility]) [phone] MG capsule Take 3 capsules by mouth 4 (four) times daily -  before meals and at bedtime. 08/13/20   Pyrtle, Carie Caddy, MD  Cholecalciferol (VITAMIN D) 50 MCG (2000 UT) tablet Take 2,000 Units by mouth daily. 07/24/20   [provider]  divalproex (DEPAKOTE ER) 500 MG 24 hr tablet TAKE 4 TABLETS BY MOUTH TWICE DAILY 10/02/22   Everlena Cooper, Adam R, DO  famotidine (PEPCID) 20 MG tablet Take  1 tablet (20 mg total) by mouth 2 (two) times daily as needed for heartburn or indigestion. 08/06/20   Littie Deeds, MD  ibuprofen (ADVIL) 600 MG tablet Take 1 tablet (600 mg total) by mouth every 6 (six) hours as needed (take with food). 12/05/21   Sloan Leiter, DO  levETIRAcetam (KEPPRA) 750 MG tablet TAKE 1 TABLET (750 MG TOTAL) BY MOUTH 2 (TWO) TIMES DAILY. 11/02/22   Everlena Cooper, Adam R, DO  lidocaine (LIDODERM) 5 % Place 1 patch onto the skin daily as needed. Remove & Discard patch within 12 hours or as directed by MD 12/05/21   Tanda Rockers A, DO  pantoprazole (PROTONIX) 40 MG tablet TAKE 1 TABLET (40 MG TOTAL) BY MOUTH 2 (TWO) TIMES DAILY. 01/06/21   Sherrilyn Rist, MD  phenytoin (DILANTIN) 100 MG ER capsule TAKE 2 CAPSULES (200 MG TOTAL) BY MOUTH IN THE MORNING. 11/02/22   Everlena Cooper, Adam R, DO  phenytoin (DILANTIN) 50 MG tablet CHEW 1 TABLET (50 MG TOTAL) BY MOUTH AT BEDTIME. 06/12/22   Drema Dallas, DO      Allergies    Mobic [meloxicam]    Review of Systems   Review of Systems  Gastrointestinal:  Negative for abdominal pain.  Musculoskeletal:  Positive for back pain.  Neurological:  Negative for numbness.  All other systems reviewed and are negative.  Physical Exam Updated Vital Signs BP (!) 144/95 (BP Location: Right Arm)   Pulse 83   Temp 98.2 F (36.8 C) (Oral)   Resp 18   Ht 5\' 6"  (1.676 m)   Wt 59.8 kg   SpO2 98%   BMI 21.28 kg/m  Physical Exam Vitals and nursing note reviewed.  Constitutional:      Appearance: He is well-developed.  HENT:     Head: Normocephalic.     Nose: Nose normal.     Mouth/Throat:     Mouth: Mucous membranes are moist.  Cardiovascular:     Rate and Rhythm: Normal rate.     Pulses: Normal pulses.     Comments: Tender right upper and mid chest to palpation Pulmonary:     Effort: Pulmonary effort is normal.  Abdominal:     General: Abdomen is flat. There is no distension.  Musculoskeletal:        General: Normal range of motion.      Cervical back: Normal range of motion.     Comments: Tender right lower back, no rash  Skin:    General: Skin is warm.  Neurological:     Mental Status: He is alert and oriented to person, place, and time.     ED Results / Procedures / Treatments   Labs (all labs ordered are listed, but only abnormal results are displayed) Labs Reviewed - No data to display  EKG None  Radiology DG Ribs Unilateral W/Chest Right  Result Date: 01/03/2023 CLINICAL DATA:  Right rib and back pain over the last several days. EXAM: RIGHT RIBS AND CHEST - 4 VIEW COMPARISON:  None Available. FINDINGS: No fracture or other bone lesions are seen involving the ribs. There is no evidence of pneumothorax or pleural effusion. Both lungs are clear. Heart size and mediastinal contours are within normal limits. IMPRESSION: Negative right rib and chest radiographs. Electronically Signed   By: Marin Roberts M.D.   On: 01/03/2023 11:57    Procedures Procedures    Medications Ordered in ED Medications  HYDROcodone-acetaminophen (NORCO/VICODIN) 5-325 MG per tablet 2 tablet (2 tablets Oral Given 01/03/23 1129)    ED Course/ Medical Decision Making/ A&P                                 Medical Decision Making Patient complains of pain in the right side of his chest and chronic pain in his low back.  Patient is concerned that he could have broken a rib.  Patient reports he has seizures and thinks he may have rolled off the bed.  Amount and/or Complexity of Data Reviewed Labs: ordered. Decision-making details documented in ED Course.    Details: Labs ordered reviewed and interpreted.  CBC and c-Met show no acute abnormalities Analysis shows no evidence of infection Radiology: ordered and independent interpretation performed. Decision-making details documented in ED Course.    Details: Right rib series shows no fracture  Risk Prescription drug management. Risk Details: Patient is given 2 hydrocodone tablets for  his discomfort.  Patient given 10 hydrocodone tablets and Robaxin.  He is advised to follow-up with his primary care physician for recheck.           Final Clinical Impression(s) / ED Diagnoses Final diagnoses:  Chronic low back pain without sciatica, unspecified back pain laterality  Contusion of right chest wall, initial encounter    Rx / DC Orders ED Discharge  Orders          Ordered    HYDROcodone-acetaminophen (NORCO/VICODIN) 5-325 MG tablet  Every 4 hours PRN        01/03/23 1717    methocarbamol (ROBAXIN) 500 MG tablet  4 times daily        01/03/23 1717          An After Visit Summary was printed and given to the patient.     Osie Cheeks 01/03/23 1915    Wynetta Fines, MD 01/10/23 [phone]

## 2023-01-03 NOTE — ED Triage Notes (Signed)
BIBA reports chronic lower back pain that started again 3 days ago while watching TV. No known injury. Denies numbness/tingling.

## 2023-01-04 ENCOUNTER — Ambulatory Visit: Payer: Medicare HMO | Admitting: Neurology

## 2023-01-04 ENCOUNTER — Encounter: Payer: Self-pay | Admitting: Neurology

## 2023-01-06 ENCOUNTER — Telehealth: Payer: Self-pay | Admitting: Neurology

## 2023-01-06 NOTE — Telephone Encounter (Signed)
Patient dismissed from [facility] and all providers practicing at this clinic. We have decided to dimss you from our practice to our our No Show Policy 01/04/23

## 2023-01-12 ENCOUNTER — Other Ambulatory Visit: Payer: Self-pay | Admitting: Neurology

## 2023-01-12 DIAGNOSIS — G40209 Localization-related (focal) (partial) symptomatic epilepsy and epileptic syndromes with complex partial seizures, not intractable, without status epilepticus: Secondary | ICD-10-CM

## 2023-01-13 NOTE — Telephone Encounter (Signed)
1. Which medications need refilled? (List name and dosage, if known) depakote, dilantin, and keppra - wants refill to get him through until he can get another appt with a new neurologist  2. Which pharmacy/location is medication to be sent to? (include street and city if Insurance claims handler) [facility]

## 2023-01-14 NOTE — Telephone Encounter (Signed)
Per DR.Jaffe, We can provide him one 30 day supply.  Further refills may be prescribed by his PCP until he establishes care with a new neurologist    Patient advised.

## 2023-01-14 NOTE — Telephone Encounter (Signed)
Left message with the after service on 01-14-23 at 1:02 pm   Needs refills on medication did not leave the name of the medication

## 2023-01-20 ENCOUNTER — Emergency Department (HOSPITAL_COMMUNITY)
Admission: EM | Admit: 2023-01-20 | Discharge: 2023-01-20 | Disposition: A | Discharge status: Home / Self Care | Payer: Medicare HMO

## 2023-01-20 ENCOUNTER — Other Ambulatory Visit: Payer: Self-pay

## 2023-01-20 DIAGNOSIS — E119 Type 2 diabetes mellitus without complications: Secondary | ICD-10-CM | POA: Diagnosis not present

## 2023-01-20 DIAGNOSIS — I10 Essential (primary) hypertension: Secondary | ICD-10-CM | POA: Insufficient documentation

## 2023-01-20 DIAGNOSIS — Z79899 Other long term (current) drug therapy: Secondary | ICD-10-CM | POA: Insufficient documentation

## 2023-01-20 DIAGNOSIS — M5431 Sciatica, right side: Secondary | ICD-10-CM | POA: Diagnosis not present

## 2023-01-20 DIAGNOSIS — M549 Dorsalgia, unspecified: Secondary | ICD-10-CM | POA: Diagnosis present

## 2023-01-20 MED ORDER — LIDOCAINE 5 % EX PTCH
1.0000 | MEDICATED_PATCH | Freq: Every day | CUTANEOUS | 0 refills | Status: DC | PRN
Start: 2023-01-20 — End: 2023-03-16

## 2023-01-20 MED ORDER — OXYCODONE-ACETAMINOPHEN 5-325 MG PO TABS
1.0000 | ORAL_TABLET | Freq: Once | ORAL | Status: AC
  Administered 2023-01-20: 1 via ORAL
  Filled 2023-01-20: qty 1

## 2023-01-20 MED ORDER — METHOCARBAMOL 500 MG PO TABS
500.0000 mg | ORAL_TABLET | Freq: Four times a day (QID) | ORAL | 0 refills | Status: DC
Start: 2023-01-20 — End: 2023-03-16

## 2023-01-20 MED ORDER — KETOROLAC TROMETHAMINE 15 MG/ML IJ SOLN
15.0000 mg | Freq: Once | INTRAMUSCULAR | Status: AC
  Administered 2023-01-20: 15 mg via INTRAMUSCULAR
  Filled 2023-01-20: qty 1

## 2023-01-20 MED ORDER — HYDROCODONE-ACETAMINOPHEN 5-325 MG PO TABS
1.0000 | ORAL_TABLET | Freq: Four times a day (QID) | ORAL | 0 refills | Status: DC | PRN
Start: 2023-01-20 — End: 2023-03-16

## 2023-01-20 NOTE — Discharge Instructions (Addendum)
As we discussed, your workup in the ER today was reassuring for acute findings.  Given that you are feeling better with the medications you are given today, no additional evaluation is indicated at this time.  Your symptoms are likely due to your chronic pain.  I have given you a referral to a neurosurgeon with a number to call to schedule an appointment for follow-up evaluation and management of these chronic symptoms.  I also recommend that you follow-up with your primary care doctor.  Return if development of any new or worsening symptoms.

## 2023-01-20 NOTE — ED Provider Notes (Signed)
[place] EMERGENCY DEPARTMENT AT [facility] Provider Note   CSN: [id number] Arrival date & time: 01/20/23  1724     History  Chief Complaint  Patient presents with   Leg Pain    [name] is a 67 y.o. male.  Patient with history of diabetes, hypertension, epilepsy, right sided sciatica presents today with complaints of right sided back pain. He states that same has been an ongoing intermittent problem for about 10 years. He has had MRI imaging previously in 2018 that did not show any emergent findings. He denies ever seeing a specialist for these symptoms. He denies any new or worsening symptoms today. Pain radiates into his right leg which is consistent with his baseline pain. States that his pain has been worse over the last few weeks. He denies any loss of bowel or bladder function or saddle paraesthesias. No history of IVDU or malignancy.   The history is provided by the patient. No language interpreter was used.  Leg Pain Associated symptoms: back pain        Home Medications Prior to Admission medications   Medication Sig Start Date End Date Taking? Authorizing Provider  acetaminophen (TYLENOL) 325 MG tablet Take 2 tablets (650 mg total) by mouth every 6 (six) hours as needed. 12/05/21   Sloan Leiter, DO  amLODipine (NORVASC) 10 MG tablet TAKE 1 TABLET (10 MG TOTAL) BY MOUTH DAILY. 02/13/21   Grayce Sessions, NP  bismuth-metronidazole-tetracycline [facility]) [phone] MG capsule Take 3 capsules by mouth 4 (four) times daily -  before meals and at bedtime. 08/13/20   Pyrtle, Carie Caddy, MD  Cholecalciferol (VITAMIN D) 50 MCG (2000 UT) tablet Take 2,000 Units by mouth daily. 07/24/20   [provider]  divalproex (DEPAKOTE ER) 500 MG 24 hr tablet TAKE 4 TABLETS BY MOUTH TWICE DAILY 01/14/23   Everlena Cooper, Adam R, DO  famotidine (PEPCID) 20 MG tablet Take 1 tablet (20 mg total) by mouth 2 (two) times daily as needed for heartburn or indigestion. 08/06/20   Littie Deeds, MD  ibuprofen (ADVIL) 600 MG tablet Take 1 tablet (600 mg total) by mouth every 6 (six) hours as needed (take with food). 12/05/21   Sloan Leiter, DO  levETIRAcetam (KEPPRA) 750 MG tablet TAKE 1 TABLET (750 MG TOTAL) BY MOUTH 2 (TWO) TIMES DAILY. 11/02/22   Everlena Cooper, Adam R, DO  lidocaine (LIDODERM) 5 % Place 1 patch onto the skin daily as needed. Remove & Discard patch within 12 hours or as directed by MD 12/05/21   Sloan Leiter, DO  methocarbamol (ROBAXIN) 500 MG tablet Take 1 tablet (500 mg total) by mouth 4 (four) times daily. 01/03/23   Elson Areas, PA-C  pantoprazole (PROTONIX) 40 MG tablet TAKE 1 TABLET (40 MG TOTAL) BY MOUTH 2 (TWO) TIMES DAILY. 01/06/21   Sherrilyn Rist, MD  phenytoin (DILANTIN) 100 MG ER capsule TAKE 2 CAPSULES (200 MG TOTAL) BY MOUTH IN THE MORNING. 11/02/22   Everlena Cooper, Adam R, DO  phenytoin (DILANTIN) 50 MG tablet CHEW 1 TABLET (50 MG TOTAL) BY MOUTH AT BEDTIME. 06/12/22   Drema Dallas, DO      Allergies    Mobic [meloxicam]    Review of Systems   Review of Systems  Musculoskeletal:  Positive for arthralgias, back pain and myalgias.  All other systems reviewed and are negative.   Physical Exam Updated Vital Signs BP (!) 157/96   Pulse 86   Temp 98.8  F (37.1 C) (Oral)   Resp 18   Ht 5\' 6"  (1.676 m)   Wt 59 kg   SpO2 100%   BMI 20.99 kg/m  Physical Exam Vitals and nursing note reviewed.  Constitutional:      General: He is not in acute distress.    Appearance: Normal appearance. He is normal weight. He is not ill-appearing, toxic-appearing or diaphoretic.  HENT:     Head: Normocephalic and atraumatic.  Cardiovascular:     Rate and Rhythm: Normal rate.  Pulmonary:     Effort: Pulmonary effort is normal. No respiratory distress.  Musculoskeletal:        General: Normal range of motion.     Cervical back: Normal range of motion.     Comments: No midline tenderness to palpation of the cervical, thoracic, or lumbar spine.  No step-offs,  lesions, deformity, or overlying skin changes.  Paraspinous muscle tightness and tenderness noted to the right lower spine area. 5/5 strength and sensation intact to bilateral lower extremities. Patient observed to be ambulatory with steady gait. DP and PT pulses intact and 2+  Skin:    General: Skin is warm and dry.  Neurological:     General: No focal deficit present.     Mental Status: He is alert.  Psychiatric:        Mood and Affect: Mood normal.        Behavior: Behavior normal.     ED Results / Procedures / Treatments   Labs (all labs ordered are listed, but only abnormal results are displayed) Labs Reviewed - No data to display  EKG None  Radiology No results found.  Procedures Procedures    Medications Ordered in ED Medications  ketorolac (TORADOL) 15 MG/ML injection 15 mg (15 mg Intramuscular Given 01/20/23 2031)  oxyCODONE-acetaminophen (PERCOCET/ROXICET) 5-325 MG per tablet 1 tablet (1 tablet Oral Given 01/20/23 2031)    ED Course/ Medical Decision Making/ A&P                                 Medical Decision Making Risk Prescription drug management.    Patient presents today with complaints of right-sided back pain into his right leg.  He is afebrile, nontoxic-appearing, and in no acute distress with reassuring vital signs.  Physical exam per above reveals no midline tenderness to palpation of cervical, thoracic, or lumbar spine.  Patient observed to be ambulatory with a steady gait.  Right-sided paraspinous muscle tightness and tenderness noted to palpation.  Patient with no neurological deficits and normal neuro exam. No loss of bowel or bladder control.  No concern for cauda equina.  No fever, night sweats, weight loss, h/o cancer, IVDU.  Chart reviewed, patient has been seen for this previously.  Had MRI thoracic and lumbar spine back in 2018 which showed degenerative changes but nothing acute. Patient denies ever seeing a specialist for these symptoms. After  IM toradol and percocet, patient feels better and wants to go home. No indication at this time for further evaluation with labs or imaging.  Discussed this with patient is understanding and agreement with this. Will give a few doses of norco with robaxin and lidocaine patches for pain.  PDMP reviewed.  Patient advised not to drive or operate heavy machinery with taking these medications. Patient given referral to neurosurgery for follow-up given chronic symptoms. Evaluation and diagnostic testing in the emergency department does not suggest an emergent  condition requiring admission or immediate intervention beyond what has been performed at this time.  Plan for discharge with close PCP follow-up.  Patient is understanding and amenable with plan, educated on red flag symptoms that would prompt immediate return.  Patient discharged in stable condition.   Final Clinical Impression(s) / ED Diagnoses Final diagnoses:  Sciatica of right side    Rx / DC Orders ED Discharge Orders          Ordered    methocarbamol (ROBAXIN) 500 MG tablet  4 times daily        01/20/23 2216    HYDROcodone-acetaminophen (NORCO/VICODIN) 5-325 MG tablet  Every 6 hours PRN        01/20/23 2216    lidocaine (LIDODERM) 5 %  Daily PRN        01/20/23 2216          An After Visit Summary was printed and given to the patient.     Vear Clock 01/20/23 2221    Durwin Glaze, MD 01/21/23 [phone]

## 2023-01-20 NOTE — ED Triage Notes (Signed)
Pt BIB EMS from home c/o chronic leg pain, worse today. Denies injury

## 2023-02-15 ENCOUNTER — Telehealth: Payer: Self-pay | Admitting: Neurology

## 2023-02-15 NOTE — Telephone Encounter (Signed)
1. Which medications need refilled? (List name and dosage, if known) depakote, keppra, and dilantin  2. Which pharmacy/location is medication to be sent to? (include street and city if local pharmacy) [facility]  Pt is set to start seeing [facility] on 03/22/23, but is out until he can be seen there

## 2023-02-17 ENCOUNTER — Other Ambulatory Visit: Payer: Self-pay

## 2023-02-17 ENCOUNTER — Telehealth: Payer: Self-pay | Admitting: Neurology

## 2023-02-17 DIAGNOSIS — G40209 Localization-related (focal) (partial) symptomatic epilepsy and epileptic syndromes with complex partial seizures, not intractable, without status epilepticus: Secondary | ICD-10-CM

## 2023-02-17 MED ORDER — LEVETIRACETAM 750 MG PO TABS: 750.0000 mg | ORAL_TABLET | Freq: Two times a day (BID) | ORAL | 0 refills | Status: DC

## 2023-02-17 MED ORDER — PHENYTOIN SODIUM EXTENDED 100 MG PO CAPS: 200.0000 mg | ORAL_CAPSULE | Freq: Every morning | ORAL | 0 refills | Status: DC

## 2023-02-17 NOTE — Telephone Encounter (Signed)
Patient is wanting to know if he could get a refill till nxt month. He will have an appointment with another doctor then

## 2023-02-17 NOTE — Telephone Encounter (Signed)
Patient dismissed 01/04/23, 30 day supply sent of Dilantin,Keppra

## 2023-03-16 ENCOUNTER — Encounter (HOSPITAL_COMMUNITY): Payer: Self-pay

## 2023-03-16 ENCOUNTER — Ambulatory Visit (HOSPITAL_COMMUNITY)
Admission: EM | Admit: 2023-03-16 | Discharge: 2023-03-16 | Disposition: A | Discharge status: Home / Self Care | Payer: Medicare HMO | Attending: Family Medicine | Admitting: Family Medicine

## 2023-03-16 DIAGNOSIS — D170 Benign lipomatous neoplasm of skin and subcutaneous tissue of head, face and neck: Secondary | ICD-10-CM

## 2023-03-16 NOTE — ED Triage Notes (Signed)
Patient here today with c/o bump beside lateral right eye X 1 month. He is blind in his right eye. There is no pain.

## 2023-03-16 NOTE — ED Provider Notes (Addendum)
[facility]    CSN: [id number] Arrival date & time: 03/16/23  1025      History   Chief Complaint Chief Complaint  Patient presents with   Mass    HPI [name] is a 67 y.o. male.   HPI Here for a lump on his right cheek near his right eye.  It is been there about a month.  It has not been hurting and it is never drained any substance.   Past Medical History:  Diagnosis Date   Acute gastric ulcer without hemorrhage or perforation    Atherosclerosis of aorta (HCC) 08/05/2020   Blindness of right eye    Cocaine abuse (HCC)    Coronary artery calcification seen on CAT scan 08/05/2020   CTA Chest & AP 08/04/20: Severe proximal left anterior descending and left circumflex coronary arterial atherosclerotic calcifications   Diabetes mellitus    Epilepsy (HCC)    As reported by patient   Gastric ulcer due to Helicobacter pylori 08/06/2020    STOMACH, ANTRUM AND BODY, BIOPSY:  Gastric antral and oxyntic mucosa with Helicobacter pylori-associated gastritis (confirmed with Warthin Starry stain)    Hypertension 01/24/2017   Localization-related symptomatic epilepsy and epileptic syndromes with complex partial seizures, not intractable, without status epilepticus (HCC) 01/29/2015   LUNG ABSCESS 03/14/2008   Qualifier: Diagnosis of  By: Sherene Sires MD, Casimiro Needle B    LVH (left ventricular hypertrophy) 08/05/2020   transthoracic echocardiogram 08/05/20: moderate asymmetric left ventricular hypertrophy of the basal-septal segment. Left ventricular diastolic parameters are consistent with Grade I diastolic dysfunction    Multiple gallstones 08/05/2020   CTA 08/04/20: multiple peripherally calcified infundibular gallstones   Overdose 01/24/2017   Sciatica of right side 04/30/2016   Seizures (HCC)    since age 23;last seizure 3-4 years ago per pt   Stroke (HCC) 2 years ago   Tobacco dependence with current use 08/05/2020    Patient Active Problem List   Diagnosis Date Noted   Gastric ulcer  due to Helicobacter pylori 08/06/2020   Acute gastric ulcer without hemorrhage or perforation    Multiple gallstones 08/05/2020   Coronary artery calcification seen on CAT scan 08/05/2020   Atherosclerosis of aorta (HCC) 08/05/2020   Tobacco dependence with current use 08/05/2020   Dysphagia, pharyngeal 08/05/2020   LVH (left ventricular hypertrophy) 08/05/2020   Cocaine abuse (HCC)    Chest pain 08/04/2020   Hypertension 01/24/2017   Back pain 01/24/2017   Localization-related symptomatic epilepsy and epileptic syndromes with complex partial seizures, not intractable, without status epilepticus (HCC) 01/29/2015   Blindness of right eye     Past Surgical History:  Procedure Laterality Date   BIOPSY  08/06/2020   Procedure: BIOPSY;  Surgeon: Beverley Fiedler, MD;  Location: [facility] ENDOSCOPY;  Service: Gastroenterology;;   ESOPHAGOGASTRODUODENOSCOPY (EGD) WITH PROPOFOL N/A 08/06/2020   Procedure: ESOPHAGOGASTRODUODENOSCOPY (EGD) WITH PROPOFOL;  Surgeon: Beverley Fiedler, MD;  Location: [facility] ENDOSCOPY;  Service: Gastroenterology;  Laterality: N/A;   NO PAST SURGERIES         Home Medications    Prior to Admission medications   Medication Sig Start Date End Date Taking? Authorizing Provider  acetaminophen (TYLENOL) 325 MG tablet Take 2 tablets (650 mg total) by mouth every 6 (six) hours as needed. 12/05/21   Sloan Leiter, DO  amLODipine (NORVASC) 10 MG tablet TAKE 1 TABLET (10 MG TOTAL) BY MOUTH DAILY. 02/13/21   Grayce Sessions, NP  Cholecalciferol (VITAMIN D) 50 MCG (2000 UT)  tablet Take 2,000 Units by mouth daily. 07/24/20   [provider]  divalproex (DEPAKOTE ER) 500 MG 24 hr tablet TAKE 4 TABLETS BY MOUTH TWICE DAILY 01/14/23   Everlena Cooper, Adam R, DO  levETIRAcetam (KEPPRA) 750 MG tablet Take 1 tablet (750 mg total) by mouth 2 (two) times daily. 02/17/23   Everlena Cooper, Adam R, DO  pantoprazole (PROTONIX) 40 MG tablet TAKE 1 TABLET (40 MG TOTAL) BY MOUTH 2 (TWO) TIMES DAILY. 01/06/21    Sherrilyn Rist, MD  phenytoin (DILANTIN) 100 MG ER capsule Take 2 capsules (200 mg total) by mouth in the morning. 02/17/23   Drema Dallas, DO  phenytoin (DILANTIN) 50 MG tablet CHEW 1 TABLET (50 MG TOTAL) BY MOUTH AT BEDTIME. 06/12/22   Drema Dallas, DO    Family History Family History  Problem Relation Age of Onset   Diabetes Mother    Hypertension Mother    Colon cancer Neg Hx    Pancreatic cancer Neg Hx    Liver cancer Neg Hx    Esophageal cancer Neg Hx     Social History Social History   Tobacco Use   Smoking status: Every Day    Current packs/day: 0.25    Types: Cigarettes   Smokeless tobacco: Never  Vaping Use   Vaping status: Never Used  Substance Use Topics   Alcohol use: Yes    Comment: one 40 oz beer each day   Drug use: Yes    Types: Cocaine    Comment: cocaine--weekly per pt; last time couple days ago     Allergies   Mobic [meloxicam]   Review of Systems Review of Systems   Physical Exam Triage Vital Signs ED Triage Vitals [03/16/23 1054]  Encounter Vitals Group     BP (!) 152/79     Systolic BP Percentile      Diastolic BP Percentile      Pulse Rate 68     Resp 16     Temp 98.4 F (36.9 C)     Temp Source Oral     SpO2 97 %     Weight 140 lb (63.5 kg)     Height 5\' 5"  (1.651 m)     Head Circumference      Peak Flow      Pain Score 0     Pain Loc      Pain Education      Exclude from Growth Chart    No data found.  Updated Vital Signs BP (!) 152/79 (BP Location: Left Arm)   Pulse 68   Temp 98.4 F (36.9 C) (Oral)   Resp 16   Ht 5\' 5"  (1.651 m)   Wt 63.5 kg   SpO2 97%   BMI 23.30 kg/m   Visual Acuity Right Eye Distance:   Left Eye Distance:   Bilateral Distance:    Right Eye Near:   Left Eye Near:    Bilateral Near:     Physical Exam Vitals reviewed.  Constitutional:      General: He is not in acute distress.    Appearance: He is not ill-appearing, toxic-appearing or diaphoretic.  HENT:     Head:      Comments: There is a mobile nontender nodule that is 1.5 cm in diameter on his right cheek about 2 cm from the outer canthus of his right eye.  There is no erythema or fluctuance.  It is fairly soft. Skin:    Coloration: Skin  is not pale.  Neurological:     Mental Status: He is alert and oriented to person, place, and time.  Psychiatric:        Behavior: Behavior normal.      UC Treatments / Results  Labs (all labs ordered are listed, but only abnormal results are displayed) Labs Reviewed - No data to display  EKG   Radiology No results found.  Procedures Procedures (including critical care time)  Medications Ordered in UC Medications - No data to display  Initial Impression / Assessment and Plan / UC Course  I have reviewed the triage vital signs and the nursing notes.  Pertinent labs & imaging results that were available during my care of the patient were reviewed by me and considered in my medical decision making (see chart for details).     I discussed with the patient that this is most likely a fatty benign tumor like a lipoma.  It turns out that his primary care at [facility] did a referral maybe about a month ago for this but then he "got sick" and has not heard any further about it.  He is given contact information for general surgery here, but I have asked him to call his primary care about that referral they have done  Patient returned to the front desk, stating that the general surgery office is said they do not do eyes.  I have redone the contact information for a plastic surgery office.  I have also written on the discharge paperwork for the patient to understand that he does not have anything going on with his eye, but he has a fatty tumor on his cheek.  I have also reiterated that he needs to call his primary care, as they have already referred him somewhere for this problem. Final Clinical Impressions(s) / UC Diagnoses   Final diagnoses:  Lipoma of face      Discharge Instructions      Please call the surgeon to make an appointment about the growth on your face.  It would probably be best to find out who your primary care send referral to and call them.     ED Prescriptions   None    PDMP not reviewed this encounter.   Zenia Resides, MD 03/16/23 1110    Zenia Resides, MD 03/16/23 [phone]

## 2023-03-16 NOTE — Discharge Instructions (Signed)
Please call the surgeon to make an appointment about the growth on your face.  It would probably be best to find out who your primary care send referral to and call them.

## 2023-03-19 ENCOUNTER — Encounter (HOSPITAL_COMMUNITY): Payer: Self-pay

## 2023-03-19 ENCOUNTER — Emergency Department (HOSPITAL_COMMUNITY)
Admission: EM | Admit: 2023-03-19 | Discharge: 2023-03-19 | Disposition: A | Discharge status: Home / Self Care | Payer: Medicare HMO | Attending: Emergency Medicine | Admitting: Emergency Medicine

## 2023-03-19 ENCOUNTER — Other Ambulatory Visit: Payer: Self-pay

## 2023-03-19 ENCOUNTER — Emergency Department (HOSPITAL_COMMUNITY): Payer: Medicare HMO

## 2023-03-19 DIAGNOSIS — I10 Essential (primary) hypertension: Secondary | ICD-10-CM | POA: Insufficient documentation

## 2023-03-19 DIAGNOSIS — R0602 Shortness of breath: Secondary | ICD-10-CM | POA: Insufficient documentation

## 2023-03-19 DIAGNOSIS — Z79899 Other long term (current) drug therapy: Secondary | ICD-10-CM | POA: Diagnosis not present

## 2023-03-19 DIAGNOSIS — M791 Myalgia, unspecified site: Secondary | ICD-10-CM | POA: Insufficient documentation

## 2023-03-19 DIAGNOSIS — Z20822 Contact with and (suspected) exposure to covid-19: Secondary | ICD-10-CM | POA: Insufficient documentation

## 2023-03-19 DIAGNOSIS — M549 Dorsalgia, unspecified: Secondary | ICD-10-CM | POA: Diagnosis present

## 2023-03-19 DIAGNOSIS — M542 Cervicalgia: Secondary | ICD-10-CM | POA: Diagnosis not present

## 2023-03-19 LAB — BASIC METABOLIC PANEL
Anion gap: 9 (ref 5–15)
BUN: 19 mg/dL (ref 8–23)
CO2: 23 mmol/L (ref 22–32)
Calcium: 9.3 mg/dL (ref 8.9–10.3)
Chloride: 106 mmol/L (ref 98–111)
Creatinine, Ser: 0.89 mg/dL (ref 0.61–1.24)
GFR, Estimated: >60 mL/min (ref >60)
Glucose, Bld: 97 mg/dL (ref 70–99)
Potassium: 3.8 mmol/L (ref 3.5–5.1)
Sodium: 138 mmol/L (ref 135–145)

## 2023-03-19 LAB — TROPONIN I (HIGH SENSITIVITY)
Troponin I (High Sensitivity): 7 ng/L (ref <18)
Troponin I (High Sensitivity): 7 ng/L (ref <18)

## 2023-03-19 LAB — CBC
HCT: 44.3 % (ref 39.0–52.0)
Hemoglobin: 14.4 g/dL (ref 13.0–17.0)
MCH: 29 pg (ref 26.0–34.0)
MCHC: 32.5 g/dL (ref 30.0–36.0)
MCV: 89.1 fL (ref 80.0–100.0)
Platelets: 199 10*3/uL (ref 150–400)
RBC: 4.97 MIL/uL (ref 4.22–5.81)
RDW: 15.6 % — ABNORMAL HIGH (ref 11.5–15.5)
WBC: 14.9 10*3/uL — ABNORMAL HIGH (ref 4.0–10.5)
nRBC: 0 % (ref 0.0–0.2)

## 2023-03-19 LAB — CK: Total CK: 75 U/L (ref 49–397)

## 2023-03-19 LAB — RESP PANEL BY RT-PCR (RSV, FLU A&B, COVID)  RVPGX2
Influenza A by PCR: NEGATIVE
Influenza B by PCR: NEGATIVE
Resp Syncytial Virus by PCR: NEGATIVE
SARS Coronavirus 2 by RT PCR: NEGATIVE

## 2023-03-19 MED ORDER — IBUPROFEN 800 MG PO TABS: 800.0000 mg | ORAL_TABLET | Freq: Three times a day (TID) | ORAL | 0 refills | Status: DC | PRN

## 2023-03-19 MED ORDER — OXYCODONE-ACETAMINOPHEN 5-325 MG PO TABS
1.0000 | ORAL_TABLET | Freq: Once | ORAL | Status: AC
  Administered 2023-03-19: 1 via ORAL
  Filled 2023-03-19: qty 1

## 2023-03-19 NOTE — ED Provider Notes (Signed)
[place] EM[facility]ENC[facility]PARTMENT AT [facility] Provider Note   CSN: [id number] Arrival date & time: 03/19/23  1238     History {Add pertinent medical, surgical, social history, OB history to HPI:1} Chief Complaint  Patient presents with   Chest Pain    [name] is a 67 y.o. male.  Patient complains of neck and back pain.  Patient has a history of hypertension and seizures   Chest Pain      Home Medications Prior to Admission medications   Medication Sig Start Date End Date Taking? Authorizing Provider  acetaminophen (TYLENOL) 325 MG tablet Take 2 tablets (650 mg total) by mouth every 6 (six) hours as needed. 12/05/21  Yes Tanda Rockers A, DO  amLODipine (NORVASC) 5 MG tablet Take 5 mg by mouth daily. 02/18/23  Yes [provider]  divalproex (DEPAKOTE ER) 500 MG 24 hr tablet TAKE 4 TABLETS BY MOUTH TWICE DAILY 01/14/23  Yes Jaffe, Adam R, DO  ibuprofen (ADVIL) 800 MG tablet Take 1 tablet (800 mg total) by mouth every 8 (eight) hours as needed for moderate pain (pain score 4-6). 03/19/23  Yes Bethann Berkshire, MD  levETIRAcetam (KEPPRA) 750 MG tablet Take 1 tablet (750 mg total) by mouth 2 (two) times daily. 02/17/23  Yes Jaffe, Adam R, DO  pantoprazole (PROTONIX) 40 MG tablet TAKE 1 TABLET (40 MG TOTAL) BY MOUTH 2 (TWO) TIMES DAILY. 01/06/21  Yes Danis, Andreas Blower, MD  phenytoin (DILANTIN) 100 MG ER capsule Take 2 capsules (200 mg total) by mouth in the morning. 02/17/23  Yes Everlena Cooper, Adam R, DO  phenytoin (DILANTIN) 50 MG tablet CHEW 1 TABLET (50 MG TOTAL) BY MOUTH AT BEDTIME. 06/12/22  Yes Jaffe, Adam R, DO  Cholecalciferol (VITAMIN D) 50 MCG (2000 UT) tablet Take 2,000 Units by mouth daily. Patient not taking: Reported on 03/19/2023 07/24/20   [provider]      Allergies    Mobic [meloxicam]    Review of Systems   Review of Systems  Cardiovascular:  Positive for chest pain.    Physical Exam Updated Vital Signs BP 123/80   Pulse 97    Temp 99.5 F (37.5 C) (Oral)   Resp 19   Ht 5\' 5"  (1.651 m)   Wt 63.5 kg   SpO2 98%   BMI 23.30 kg/m  Physical Exam  ED Results / Procedures / Treatments   Labs (all labs ordered are listed, but only abnormal results are displayed) Labs Reviewed  CBC - Abnormal; Notable for the following components:      Result Value   WBC 14.9 (*)    RDW 15.6 (*)    All other components within normal limits  RESP PANEL BY RT-PCR (RSV, FLU A&B, COVID)  [id number]  BASIC METABOLIC PANEL  CK  URINALYSIS, ROUTINE W REFLEX MICROSCOPIC  TROPONIN I (HIGH SENSITIVITY)  TROPONIN I (HIGH SENSITIVITY)    EKG EKG Interpretation Date/Time:  Friday March 19 2023 14:25:25 EST Ventricular Rate:  100 PR Interval:  138 QRS Duration:  70 QT Interval:  316 QTC Calculation: 407 R Axis:   -12  Text Interpretation: Normal sinus rhythm Nonspecific ST and T wave abnormality Abnormal ECG When compared with ECG of 06-Aug-2020 06:56, PREVIOUS ECG IS PRESENT Confirmed by Bethann Berkshire [id number]) on 03/19/2023 8:34:47 PM  Radiology DG Chest 1 View  Result Date: 03/19/2023 CLINICAL DATA:  [id number] Chest pain [id number]. EXAM: CHEST  1 VIEW COMPARISON:  01/03/2023. FINDINGS: Bilateral lung fields are  clear. Note is made of elevated right hemidiaphragm. Bilateral costophrenic angles are clear. Normal cardio-mediastinal silhouette. No acute osseous abnormalities. The soft tissues are within normal limits. IMPRESSION: *No active disease. Electronically Signed   By: Jules Schick M.D.   On: 03/19/2023 15:46    Procedures Procedures  {Document cardiac monitor, telemetry assessment procedure when appropriate:1}  Medications Ordered in ED Medications  oxyCODONE-acetaminophen (PERCOCET/ROXICET) 5-325 MG per tablet 1 tablet (1 tablet Oral Given 03/19/23 2140)    ED Course/ Medical Decision Making/ A&P   {   Click here for ABCD2, HEART and other calculatorsREFRESH Note before signing :1}                               Medical Decision Making Risk Prescription drug management.   Labs unremarkable.  Patient with myalgias.  He is given Motrin and will follow-up with PCP  {Document critical care time when appropriate:1} {Document review of labs and clinical decision tools ie heart score, Chads2Vasc2 etc:1}  {Document your independent review of radiology images, and any outside records:1} {Document your discussion with family members, caretakers, and with consultants:1} {Document social determinants of health affecting pt's care:1} {Document your decision making why or why not admission, treatments were needed:1} Final Clinical Impression(s) / ED Diagnoses Final diagnoses:  Myalgia    Rx / DC Orders ED Discharge Orders          Ordered    ibuprofen (ADVIL) 800 MG tablet  Every 8 hours PRN        03/19/23 2229

## 2023-03-19 NOTE — Discharge Instructions (Signed)
Follow-up with your doctor next week for recheck

## 2023-03-19 NOTE — ED Triage Notes (Signed)
Pt c/o pain in chest, neck, back, and hips that started a few days ago and has worsened today. Pt denies nausea and vomiting, fevers, or shortness of breath. Pt denies injury.

## 2023-03-19 NOTE — ED Provider Triage Note (Signed)
Emergency Medicine Provider Triage Evaluation Note  KATRON SCHERZER , a 67 y.o. male  was evaluated in triage. Pt complains of body pain. Started 3 days ago.  Also including chest pain but no shortness of breath.  Pain is primarily around the upper back and radiates around the shoulders and down the arms and legs.  Denies illicit drug use or alcohol use.  Denies nausea vomiting diarrhea.  Denies urinary changes.   Review of Systems  Positive: See above Negative: See above  Physical Exam  BP (!) 164/104 (BP Location: Right Arm)   Pulse 100   Temp 99.4 F (37.4 C) (Oral)   Resp 18   Ht 5\' 5"  (1.651 m)   Wt 63.5 kg   SpO2 97%   BMI 23.30 kg/m  Gen:   Awake, no distress   Resp:  Normal effort  MSK:   Moves extremities without difficulty  Other:    Medical Decision Making  Medically screening exam initiated at 2:35 PM.  Appropriate orders placed.  MICKALE LAWRENCE was informed that the remainder of the evaluation will be completed by another provider, this initial triage assessment does not replace that evaluation, and the importance of remaining in the ED until their evaluation is complete.  Work up started   Gareth Eagle, [place] 03/19/23 1437

## 2023-03-22 ENCOUNTER — Encounter: Payer: Self-pay | Admitting: Neurology

## 2023-03-22 ENCOUNTER — Ambulatory Visit (INDEPENDENT_AMBULATORY_CARE_PROVIDER_SITE_OTHER): Payer: Medicare HMO | Admitting: Neurology

## 2023-03-22 ENCOUNTER — Other Ambulatory Visit: Payer: Self-pay

## 2023-03-22 VITALS — BP 117/83 | HR 93 | Ht 65.0 in | Wt 137.5 lb

## 2023-03-22 DIAGNOSIS — G40209 Localization-related (focal) (partial) symptomatic epilepsy and epileptic syndromes with complex partial seizures, not intractable, without status epilepticus: Secondary | ICD-10-CM | POA: Diagnosis not present

## 2023-03-22 DIAGNOSIS — Z5181 Encounter for therapeutic drug level monitoring: Secondary | ICD-10-CM | POA: Diagnosis not present

## 2023-03-22 MED ORDER — DIVALPROEX SODIUM ER 500 MG PO TB24: 2000.0000 mg | ORAL_TABLET | Freq: Two times a day (BID) | ORAL | 3 refills | Status: DC

## 2023-03-22 MED ORDER — PHENYTOIN 50 MG PO CHEW: CHEWABLE_TABLET | ORAL | 3 refills | Status: AC

## 2023-03-22 MED ORDER — LEVETIRACETAM 750 MG PO TABS: 750.0000 mg | ORAL_TABLET | Freq: Two times a day (BID) | ORAL | 3 refills | Status: DC

## 2023-03-22 MED ORDER — PHENYTOIN SODIUM EXTENDED 100 MG PO CAPS: 200.0000 mg | ORAL_CAPSULE | Freq: Every morning | ORAL | 3 refills | Status: DC

## 2023-03-22 NOTE — Progress Notes (Signed)
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  Patient has a long history of epilepsy, well-managed on Depakote 2000 mg twice daily, levetiracetam 1500 mg twice daily and phenytoin 200/250 mg but now needs a new neurologist.  He tells me that he does have auras of seeing color lights in his central vision field prior to his seizures.  His last seizure was more than a year and a half ago.  Overall she is doing well, no current complaint or concern other than chronic pain all over his body.  He reports being seen recently in the hospital due to chronic pain a couple days ago.    OTHER MEDICAL CONDITIONS: Focal epilepsy, Hypertension, heart disease, blindness in the right eye   REVIEW OF SYSTEMS: Full 14 system review of systems performed and negative with exception of: As noted in the HPI   ALLERGIES: Allergies  Allergen Reactions   Mobic [Meloxicam] Nausea Only    "pain all over"    HOME MEDICATIONS: Outpatient Medications Prior to Visit  Medication Sig Dispense Refill   acetaminophen (TYLENOL) 325 MG tablet Take 2 tablets (650 mg total) by mouth every 6 (six) hours as needed. 36 tablet 0   amLODipine (NORVASC) 5 MG tablet Take 5 mg by mouth daily.     Cholecalciferol (VITAMIN D) 50 MCG (2000 UT)  tablet Take 2,000 Units by mouth daily.     ibuprofen (ADVIL) 800 MG tablet Take 1 tablet (800 mg total) by mouth every 8 (eight) hours as needed for moderate pain (pain score 4-6). 21 tablet 0   pantoprazole (PROTONIX) 40 MG tablet TAKE 1 TABLET (40 MG TOTAL) BY MOUTH 2 (TWO) TIMES DAILY. 60 tablet 0   divalproex (DEPAKOTE ER) 500 MG 24 hr tablet TAKE 4 TABLETS BY MOUTH TWICE DAILY 240 tablet 0   levETIRAcetam (KEPPRA) 750 MG tablet Take 1 tablet (750 mg total) by mouth 2 (two) times daily. 60 tablet 0   phenytoin (DILANTIN) 100 MG ER capsule Take 2 capsules (200 mg total) by mouth in the morning. 60 capsule 0   phenytoin (DILANTIN) 50 MG tablet CHEW 1 TABLET (50 MG TOTAL) BY MOUTH AT BEDTIME. 90 tablet 0   Facility-Administered Medications Prior to Visit  Medication Dose Route Frequency Provider Last Rate Last Admin   0.9 %  sodium chloride infusion  500 mL Intravenous Once Sherrilyn Rist, MD        PAST MEDICAL HISTORY: Past Medical History:  Diagnosis Date   Acute gastric ulcer without hemorrhage or perforation    Atherosclerosis of aorta (HCC) 08/05/2020   Blindness of right eye    Cocaine abuse (HCC)    Coronary artery calcification seen on CAT scan 08/05/2020   CTA Chest & AP 08/04/20: Severe proximal left anterior descending and left circumflex coronary arterial  atherosclerotic calcifications   Diabetes mellitus    Epilepsy (HCC)    As reported by patient   Gastric ulcer due to Helicobacter pylori 08/06/2020    STOMACH, ANTRUM AND BODY, BIOPSY:  Gastric antral and oxyntic mucosa with Helicobacter pylori-associated gastritis (confirmed with Warthin Starry stain)    Hypertension 01/24/2017   Localization-related symptomatic epilepsy and epileptic syndromes with complex partial seizures, not intractable, without status epilepticus (HCC) 01/29/2015   LUNG ABSCESS 03/14/2008   Qualifier: Diagnosis of  By: Sherene Sires MD, Casimiro Needle B    LVH (left ventricular hypertrophy) 08/05/2020    transthoracic echocardiogram 08/05/20: moderate asymmetric left ventricular hypertrophy of the basal-septal segment. Left ventricular diastolic parameters are consistent with Grade I diastolic dysfunction    Multiple gallstones 08/05/2020   CTA 08/04/20: multiple peripherally calcified infundibular gallstones   Overdose 01/24/2017   Sciatica of right side 04/30/2016   Seizures (HCC)    since age 42;last seizure 3-4 years ago per pt   Stroke (HCC) 2 years ago   Tobacco dependence with current use 08/05/2020    PAST SURGICAL HISTORY: Past Surgical History:  Procedure Laterality Date   BIOPSY  08/06/2020   Procedure: BIOPSY;  Surgeon: Beverley Fiedler, MD;  Location: [facility] ENDOSCOPY;  Service: Gastroenterology;;   ESOPHAGOGASTRODUODENOSCOPY (EGD) WITH PROPOFOL N/A 08/06/2020   Procedure: ESOPHAGOGASTRODUODENOSCOPY (EGD) WITH PROPOFOL;  Surgeon: Beverley Fiedler, MD;  Location: [facility] ENDOSCOPY;  Service: Gastroenterology;  Laterality: N/A;   NO PAST SURGERIES      FAMILY HISTORY: Family History  Problem Relation Age of Onset   Diabetes Mother    Hypertension Mother    Colon cancer Neg Hx    Pancreatic cancer Neg Hx    Liver cancer Neg Hx    Esophageal cancer Neg Hx     SOCIAL HISTORY: Social History   Socioeconomic History   Marital status: Single    Spouse name: Not on file   Number of children: Not on file   Years of education: Not on file   Highest education level: Not on file  Occupational History   Not on file  Tobacco Use   Smoking status: Every Day    Current packs/day: 0.25    Types: Cigarettes   Smokeless tobacco: Never  Vaping Use   Vaping status: Never Used  Substance and Sexual Activity   Alcohol use: Yes    Comment: one 40 oz beer each day   Drug use: Yes    Types: Cocaine    Comment: cocaine--weekly per pt; last time couple days ago   Sexual activity: Never  Other Topics Concern   Not on file  Social History Narrative   Right handed   One story home   Negative  caffeine   Social Determinants of Health   Financial Resource Strain: Not on file  Food Insecurity: Not on file  Transportation Needs: Not on file  Physical Activity: Not on file  Stress: Not on file  Social Connections: Not on file  Intimate Partner Violence: Not on file    PHYSICAL EXAM  GENERAL EXAM/CONSTITUTIONAL: Vitals:  Vitals:   03/22/23 1045  BP: 117/83  Pulse: 93  Weight: 137 lb 8 oz (62.4 kg)  Height: 5\' 5"  (1.651 m)   Body mass index is 22.88 kg/m. Wt Readings from Last 3 Encounters:  03/22/23 137 lb 8 oz (62.4 kg)  03/19/23 140 lb (63.5 kg)  03/16/23 140 lb (63.5 kg)   Patient is in no distress; well developed, nourished and groomed;  neck is supple  MUSCULOSKELETAL: Gait, strength, tone, movements noted in Neurologic exam below  NEUROLOGIC: MENTAL STATUS:      No data to display         awake, alert, oriented to person, place and time recent and remote memory intact normal attention and concentration language fluent, comprehension intact, naming intact fund of knowledge appropriate  CRANIAL NERVE:  2nd, 3rd, 4th, 6th - Visual fields full to confrontation with left eye. Right eye blindness 5th - facial sensation symmetric 7th - facial strength symmetric 8th - hearing intact 9th - palate elevates symmetrically, uvula midline 11th - shoulder shrug symmetric 12th - tongue protrusion midline  MOTOR:  normal bulk and tone, full strength in the BUE, BLE  SENSORY:  normal and symmetric to light touch  COORDINATION:  finger-nose-finger, fine finger movements normal  GAIT/STATION:  normal   DIAGNOSTIC DATA (LABS, IMAGING, TESTING) - I reviewed patient records, labs, notes, testing and imaging myself where available.  Lab Results  Component Value Date   WBC 14.9 (H) 03/19/2023   HGB 14.4 03/19/2023   HCT 44.3 03/19/2023   MCV 89.1 03/19/2023   PLT 199 03/19/2023      Component Value Date/Time   NA 138 03/19/2023 1450   NA 145 (H)  06/11/2021 0000   K 3.8 03/19/2023 1450   CL 106 03/19/2023 1450   CO2 23 03/19/2023 1450   GLUCOSE 97 03/19/2023 1450   BUN 19 03/19/2023 1450   BUN 24 06/11/2021 0000   CREATININE 0.89 03/19/2023 1450   CALCIUM 9.3 03/19/2023 1450   PROT 7.5 01/03/2023 1250   PROT 6.7 06/11/2021 0000   ALBUMIN 3.5 01/03/2023 1250   ALBUMIN 4.0 06/11/2021 0000   AST 16 01/03/2023 1250   ALT 14 01/03/2023 1250   ALKPHOS 60 01/03/2023 1250   BILITOT 0.7 01/03/2023 1250   BILITOT <0.2 06/11/2021 0000   GFRNONAA >60 03/19/2023 1450   GFRAA >60 09/20/2019 0823   Lab Results  Component Value Date   CHOL 183 11/05/2020   HDL 76 11/05/2020   LDLCALC 78 11/05/2020   TRIG 172 (H) 11/05/2020   CHOLHDL 2.4 11/05/2020   Lab Results  Component Value Date   HGBA1C 5.7 (H) 08/05/2020   Lab Results  Component Value Date   VITAMINB12 1412 (H) 11/26/2010   Lab Results  Component Value Date   TSH 0.984 08/06/2020    Head CT 01/23/2017 1. Chronic stable appearance of the brain with cerebellar atrophy and mild chronic small vessel ischemia. 2. No acute intracranial abnormality.    ASSESSMENT AND PLAN  67 y.o. 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  Patient was previously well-managed on 3 antiseizure medications, Depakote 2000 mg twice daily, levetiracetam 1500 mg twice daily, and phenytoin 200/250 mg.  He tells me his last seizure was more than a year and a half ago.  Currently he needs a new neurologist.  Plan will be to continue patient is on his current medications, no additional workup needed at the moment  I will see him in 6 to 8 months for follow-up or sooner if worse.  They understand to contact me if they have any breakthrough seizure.   1. Therapeutic drug monitoring   2. Localization-related (focal) (partial) symptomatic epilepsy and epileptic syndromes with complex  partial seizures, not intractable, without status epilepticus (HCC)  Patient Instructions  Continue current medication Levetiracetam 1500 mg twice daily Valproic acid 2000 mg twice daily Phenytoin 200 g morning and 250 at night Will check antiseizure medications level with CMP and vitamin D level Continue your other medications Follow-up in 6 to 8 months.  Orders Placed This Encounter  Procedures   Levetiracetam level   Valproic Acid Level   Phenytoin level, free and total   CMP   Vitamin D, 25-hydroxy    Meds ordered this encounter  Medications   levETIRAcetam (KEPPRA) 750 MG tablet    Sig: Take 1 tablet (750 mg total) by mouth 2 (two) times daily.    Dispense:  180 tablet    Refill:  3   phenytoin (DILANTIN) 100 MG ER capsule    Sig: Take 2 capsules (200 mg total) by mouth in the morning.    Dispense:  180 capsule    Refill:  3   phenytoin (DILANTIN) 50 MG tablet    Sig: CHEW 1 TABLET (50 MG TOTAL) BY MOUTH AT BEDTIME.    Dispense:  90 tablet    Refill:  3   divalproex (DEPAKOTE ER) 500 MG 24 hr tablet    Sig: Take 4 tablets (2,000 mg total) by mouth in the morning and at bedtime. TAKE 4 TABLETS BY MOUTH TWICE DAILY    Dispense:  720 tablet    Refill:  3    Return in about 6 months (around 09/19/2023).    Windell Norfolk, MD 03/22/2023, 11:18 AM  [facility] [address], [address] [place], [place] [address] [phone]

## 2023-03-22 NOTE — Addendum Note (Signed)
Addended byWindell Norfolk on: 03/22/2023 11:45 AM   Modules accepted: Orders

## 2023-03-22 NOTE — Patient Instructions (Addendum)
Continue current medication Levetiracetam 1500 mg twice daily Valproic acid 2000 mg twice daily Phenytoin 200 g morning and 250 at night Will check antiseizure medications level with CMP and vitamin D level Continue your other medications Follow-up in 6 to 8 months.

## 2023-03-23 LAB — COMPREHENSIVE METABOLIC PANEL
ALT: 19 [IU]/L (ref 0–44)
AST: 14 [IU]/L (ref 0–40)
Albumin: 4.2 g/dL (ref 3.9–4.9)
Alkaline Phosphatase: 82 [IU]/L (ref 44–121)
BUN/Creatinine Ratio: 25 — ABNORMAL HIGH (ref 10–24)
BUN: 28 mg/dL — ABNORMAL HIGH (ref 8–27)
Bilirubin Total: 0.4 mg/dL (ref 0.0–1.2)
CO2: 23 mmol/L (ref 20–29)
Calcium: 10 mg/dL (ref 8.6–10.2)
Chloride: 101 mmol/L (ref 96–106)
Creatinine, Ser: 1.1 mg/dL (ref 0.76–1.27)
Globulin, Total: 3.9 g/dL (ref 1.5–4.5)
Glucose: 102 mg/dL — ABNORMAL HIGH (ref 70–99)
Potassium: 4.1 mmol/L (ref 3.5–5.2)
Sodium: 145 mmol/L — ABNORMAL HIGH (ref 134–144)
Total Protein: 8.1 g/dL (ref 6.0–8.5)
eGFR: 74 mL/min/{1.73_m2} (ref >59)

## 2023-03-23 LAB — VITAMIN D 25 HYDROXY (VIT D DEFICIENCY, FRACTURES): Vit D, 25-Hydroxy: 21.9 ng/mL — ABNORMAL LOW (ref 30.0–100.0)

## 2023-03-23 LAB — LEVETIRACETAM LEVEL: Levetiracetam Lvl: 26.8 ug/mL (ref 10.0–40.0)

## 2023-03-23 LAB — VALPROIC ACID LEVEL: Valproic Acid Lvl: 63 ug/mL (ref 50–100)

## 2023-03-23 LAB — PHENYTOIN LEVEL, FREE AND TOTAL
Phenytoin, Free: 0.8 ug/mL — ABNORMAL LOW (ref 1.0–2.0)
Phenytoin, Serum: 5 ug/mL — ABNORMAL LOW (ref 10.0–20.0)

## 2023-03-23 MED ORDER — VITAMIN D (ERGOCALCIFEROL) 1.25 MG (50000 UNIT) PO CAPS
50000.0000 [IU] | ORAL_CAPSULE | ORAL | 0 refills | Status: DC
Start: 2023-03-23 — End: 2023-09-29

## 2023-03-23 NOTE — Addendum Note (Signed)
Addended byWindell Norfolk on: 03/23/2023 05:24 PM   Modules accepted: Orders

## 2023-03-23 NOTE — Progress Notes (Signed)
Please call and advise the patient that the recent labs we checked showed a normal Keppra and Depakote level. It also showed evidence of dehydration and Vitamin D deficiency. Please advise patient to increase water intake and start Vitamin D supplement weekly for the next 12 weeks.  Please remind patient to keep any upcoming appointments or tests and to call us with any interim questions, concerns, problems or updates. Thanks,   Windell Norfolk, MD

## 2023-03-23 NOTE — Progress Notes (Signed)
Please call and advise the patient that the recent labs we checked showed that his phenytoin level was low. Please remind patient to take medications as prescribed. Please remind patient to keep any upcoming appointments or tests and to call us with any interim questions, concerns, problems or updates. Thanks,   Windell Norfolk, MD

## 2023-03-24 ENCOUNTER — Telehealth: Payer: Self-pay

## 2023-03-24 NOTE — Telephone Encounter (Signed)
Call to patient and reviewed results. Patient verbalized understanding and had no further questions.

## 2023-03-24 NOTE — Telephone Encounter (Signed)
-----   Message from [place] sent at 03/23/2023  5:24 PM EST ----- Please call and advise the patient that the recent labs we checked showed a normal Keppra and Depakote level. It also showed evidence of dehydration and Vitamin D deficiency. Please advise patient to increase water intake and start Vitamin D supplement weekly for the next 12 weeks.  Please remind patient to keep any upcoming appointments or tests and to call us with any interim questions, concerns, problems or updates. Thanks,   Windell Norfolk, MD

## 2023-03-24 NOTE — Telephone Encounter (Signed)
Call to patient, reviewed results. He stated he may have missed a dose or two but we reviewed the importance of medication compliance. He verbalized understanding.

## 2023-04-15 ENCOUNTER — Ambulatory Visit: Payer: Medicare HMO | Admitting: Plastic Surgery

## 2023-06-04 ENCOUNTER — Other Ambulatory Visit: Payer: Self-pay | Admitting: Family Medicine

## 2023-06-04 ENCOUNTER — Ambulatory Visit
Admission: RE | Admit: 2023-06-04 | Discharge: 2023-06-04 | Disposition: A | Discharge status: Home / Self Care | Payer: Medicare HMO | Source: Ambulatory Visit | Attending: Family Medicine | Admitting: Family Medicine

## 2023-06-04 DIAGNOSIS — M545 Low back pain, unspecified: Secondary | ICD-10-CM

## 2023-06-04 DIAGNOSIS — M79671 Pain in right foot: Secondary | ICD-10-CM

## 2023-07-30 ENCOUNTER — Encounter (HOSPITAL_COMMUNITY): Payer: Self-pay

## 2023-07-30 ENCOUNTER — Other Ambulatory Visit: Payer: Self-pay

## 2023-07-30 ENCOUNTER — Emergency Department (HOSPITAL_COMMUNITY)
Admission: EM | Admit: 2023-07-30 | Discharge: 2023-07-30 | Disposition: A | Discharge status: Home / Self Care | Attending: Emergency Medicine | Admitting: Emergency Medicine

## 2023-07-30 DIAGNOSIS — E119 Type 2 diabetes mellitus without complications: Secondary | ICD-10-CM | POA: Insufficient documentation

## 2023-07-30 DIAGNOSIS — L720 Epidermal cyst: Secondary | ICD-10-CM | POA: Diagnosis present

## 2023-07-30 NOTE — Discharge Instructions (Signed)
 As we discussed, I would not recommend we drain the cyst on your face as it is not infectious. It likely needs a full excision. You can reach out to the plastic surgeon whose contact information I provided above.

## 2023-07-30 NOTE — ED Triage Notes (Signed)
 Pt here for right eye abscess that popped up 3 weeks ago. Pt was seen at [facility] for same. Pt is blind in right eye. No pain. Axox4. Denies fevers/no drainage.

## 2023-07-30 NOTE — ED Provider Notes (Signed)
 [place] EMERGENCY DEPARTMENT AT [facility] Provider Note   CSN: [id number] Arrival date & time: 07/30/23  [id number]     History  Chief Complaint  Patient presents with   Eye Problem    [name] is a 68 y.o. male with past medical history significant for drug abuse, diabetes, blindness of right eye who presents with concern for right eye abscess that popped up around 3 weeks ago.  He was seen at urgent care for the same.  He thinks that it needs to be lanced.  He reports it is not painful, there has been no drainage, there is no discharge, or foul odor.  He denies any systemic fever, chills.  HPI     Home Medications Prior to Admission medications   Medication Sig Start Date End Date Taking? Authorizing Provider  acetaminophen (TYLENOL) 325 MG tablet Take 2 tablets (650 mg total) by mouth every 6 (six) hours as needed. 12/05/21   Sloan Leiter, DO  amLODipine (NORVASC) 5 MG tablet Take 5 mg by mouth daily. 02/18/23   [provider]  Cholecalciferol (VITAMIN D) 50 MCG (2000 UT) tablet Take 2,000 Units by mouth daily. 07/24/20   [provider]  divalproex (DEPAKOTE ER) 500 MG 24 hr tablet Take 4 tablets (2,000 mg total) by mouth in the morning and at bedtime. TAKE 4 TABLETS BY MOUTH TWICE DAILY 03/22/23 03/16/24  Windell Norfolk, MD  ibuprofen (ADVIL) 800 MG tablet Take 1 tablet (800 mg total) by mouth every 8 (eight) hours as needed for moderate pain (pain score 4-6). 03/19/23   Bethann Berkshire, MD  levETIRAcetam (KEPPRA) 750 MG tablet Take 1 tablet (750 mg total) by mouth 2 (two) times daily. 03/22/23 03/16/24  Windell Norfolk, MD  pantoprazole (PROTONIX) 40 MG tablet TAKE 1 TABLET (40 MG TOTAL) BY MOUTH 2 (TWO) TIMES DAILY. 01/06/21   Sherrilyn Rist, MD  phenytoin (DILANTIN) 100 MG ER capsule Take 2 capsules (200 mg total) by mouth in the morning. 03/22/23 03/16/24  Windell Norfolk, MD  phenytoin (DILANTIN) 50 MG tablet CHEW 1 TABLET (50 MG TOTAL) BY  MOUTH AT BEDTIME. 03/22/23   Windell Norfolk, MD  Vitamin D, Ergocalciferol, (DRISDOL) 1.25 MG (50000 UNIT) CAPS capsule Take 1 capsule (50,000 Units total) by mouth every 7 (seven) days. 03/23/23   Windell Norfolk, MD      Allergies    Mobic [meloxicam]    Review of Systems   Review of Systems  All other systems reviewed and are negative.   Physical Exam Updated Vital Signs BP 114/74   Pulse 74   Temp 98.3 F (36.8 C) (Oral)   Resp 20   Ht 5\' 5"  (1.651 m)   Wt 61.2 kg   SpO2 97%   BMI 22.47 kg/m  Physical Exam Vitals and nursing note reviewed.  Constitutional:      General: He is not in acute distress.    Appearance: Normal appearance.  HENT:     Head: Normocephalic and atraumatic.  Eyes:     General:        Right eye: No discharge.        Left eye: No discharge.  Cardiovascular:     Rate and Rhythm: Normal rate and regular rhythm.  Pulmonary:     Effort: Pulmonary effort is normal. No respiratory distress.  Musculoskeletal:        General: No deformity.  Skin:    General: Skin is warm and dry.  Comments: Patient with approximately 3cm cystic structure that is soft, movable, without skin fluctuance, redness just to the right of right eye.   Neurological:     Mental Status: He is alert and oriented to person, place, and time.  Psychiatric:        Mood and Affect: Mood normal.        Behavior: Behavior normal.     ED Results / Procedures / Treatments   Labs (all labs ordered are listed, but only abnormal results are displayed) Labs Reviewed - No data to display  EKG None  Radiology No results found.  Procedures Procedures    Medications Ordered in ED Medications - No data to display  ED Course/ Medical Decision Making/ A&P                                 Medical Decision Making  With overall noncontributory past medical history other than history of cocaine abuse, previous blindness of the right eye who presents with concern for a mass to  the lateral aspect of the right eye for around 3 weeks.  On physical exam I see a 3 cm cystic structure that is soft, movable without any fluctuance or redness.  Differential diagnosis includes epidermal inclusion cyst, lower clinical suspicion for abscess or other infectious etiology.  The eye itself is without any swelling, EOMs intact, he already has no site in the affected eye.  Discussed with patient that draining the cyst would only cause it to fill back up, increases risk for infection, and is not emergently indicated, would recommend excision of the entire cystic structure by a plastic surgeon and will refer him to 1.  Patient understands and agrees to plan, was discharged in stable condition. Final Clinical Impression(s) / ED Diagnoses Final diagnoses:  Epidermal cyst of face    Rx / DC Orders ED Discharge Orders     None         Olene Floss, PA-C 07/30/23 1034    Pricilla Loveless, MD 07/30/23 1513

## 2023-08-02 ENCOUNTER — Ambulatory Visit: Admitting: Plastic Surgery

## 2023-08-02 ENCOUNTER — Encounter: Payer: Self-pay | Admitting: Plastic Surgery

## 2023-08-02 VITALS — BP 123/75 | HR 80 | Ht 65.0 in | Wt 142.2 lb

## 2023-08-02 DIAGNOSIS — L720 Epidermal cyst: Secondary | ICD-10-CM | POA: Diagnosis not present

## 2023-08-02 DIAGNOSIS — D489 Neoplasm of uncertain behavior, unspecified: Secondary | ICD-10-CM

## 2023-08-02 NOTE — Progress Notes (Signed)
 Referring Provider Grayce Sessions, NP [address] [place],  [place] [address]   CC:  Chief Complaint  Patient presents with   Consult      DEZMOND DOWNIE is an 68 y.o. male.  HPI: Ms. Orndoff is a 68 year old male who presents today for evaluation of a mass at the right lateral canthus.  He states that this mass has been there for 2 weeks and is grown significantly over that period of time.  He denies any drainage or pain at the moment but was seen in the emergency department for the lesion.  He would like to have it removed for pathologic diagnosis.  Allergies  Allergen Reactions   Mobic [Meloxicam] Nausea Only    "pain all over"    Outpatient Encounter Medications as of 08/02/2023  Medication Sig   acetaminophen (TYLENOL) 325 MG tablet Take 2 tablets (650 mg total) by mouth every 6 (six) hours as needed.   amLODipine (NORVASC) 5 MG tablet Take 5 mg by mouth daily.   Cholecalciferol (VITAMIN D) 50 MCG (2000 UT) tablet Take 2,000 Units by mouth daily.   divalproex (DEPAKOTE ER) 500 MG 24 hr tablet Take 4 tablets (2,000 mg total) by mouth in the morning and at bedtime. TAKE 4 TABLETS BY MOUTH TWICE DAILY   ibuprofen (ADVIL) 800 MG tablet Take 1 tablet (800 mg total) by mouth every 8 (eight) hours as needed for moderate pain (pain score 4-6).   levETIRAcetam (KEPPRA) 750 MG tablet Take 1 tablet (750 mg total) by mouth 2 (two) times daily.   pantoprazole (PROTONIX) 40 MG tablet TAKE 1 TABLET (40 MG TOTAL) BY MOUTH 2 (TWO) TIMES DAILY.   phenytoin (DILANTIN) 100 MG ER capsule Take 2 capsules (200 mg total) by mouth in the morning.   phenytoin (DILANTIN) 50 MG tablet CHEW 1 TABLET (50 MG TOTAL) BY MOUTH AT BEDTIME.   Vitamin D, Ergocalciferol, (DRISDOL) 1.25 MG (50000 UNIT) CAPS capsule Take 1 capsule (50,000 Units total) by mouth every 7 (seven) days.   Facility-Administered Encounter Medications as of 08/02/2023  Medication   0.9 %  sodium chloride infusion     Past Medical  History:  Diagnosis Date   Acute gastric ulcer without hemorrhage or perforation    Atherosclerosis of aorta (HCC) 08/05/2020   Blindness of right eye    Cocaine abuse (HCC)    Coronary artery calcification seen on CAT scan 08/05/2020   CTA Chest & AP 08/04/20: Severe proximal left anterior descending and left circumflex coronary arterial atherosclerotic calcifications   Diabetes mellitus    Epilepsy (HCC)    As reported by patient   Gastric ulcer due to Helicobacter pylori 08/06/2020    STOMACH, ANTRUM AND BODY, BIOPSY:  Gastric antral and oxyntic mucosa with Helicobacter pylori-associated gastritis (confirmed with Warthin Starry stain)    Hypertension 01/24/2017   Localization-related symptomatic epilepsy and epileptic syndromes with complex partial seizures, not intractable, without status epilepticus (HCC) 01/29/2015   LUNG ABSCESS 03/14/2008   Qualifier: Diagnosis of  By: Sherene Sires MD, Casimiro Needle B    LVH (left ventricular hypertrophy) 08/05/2020   transthoracic echocardiogram 08/05/20: moderate asymmetric left ventricular hypertrophy of the basal-septal segment. Left ventricular diastolic parameters are consistent with Grade I diastolic dysfunction    Multiple gallstones 08/05/2020   CTA 08/04/20: multiple peripherally calcified infundibular gallstones   Overdose 01/24/2017   Sciatica of right side 04/30/2016   Seizures (HCC)    since age 18;last seizure 3-4 years ago per pt  Stroke [facility]) 2 years ago   Tobacco dependence with current use 08/05/2020    Past Surgical History:  Procedure Laterality Date   BIOPSY  08/06/2020   Procedure: BIOPSY;  Surgeon: Beverley Fiedler, MD;  Location: [facility] ENDOSCOPY;  Service: Gastroenterology;;   ESOPHAGOGASTRODUODENOSCOPY (EGD) WITH PROPOFOL N/A 08/06/2020   Procedure: ESOPHAGOGASTRODUODENOSCOPY (EGD) WITH PROPOFOL;  Surgeon: Beverley Fiedler, MD;  Location: [facility] ENDOSCOPY;  Service: Gastroenterology;  Laterality: N/A;   NO PAST SURGERIES      Family History  Problem  Relation Age of Onset   Diabetes Mother    Hypertension Mother    Colon cancer Neg Hx    Pancreatic cancer Neg Hx    Liver cancer Neg Hx    Esophageal cancer Neg Hx     Social History   Social History Narrative   Right handed   One story home   Negative caffeine     Review of Systems General: Denies fevers, chills, weight loss CV: Denies chest pain, shortness of breath, palpitations Skin: Mass on the right side of the face at the right lateral canthus  Physical Exam    08/02/2023    1:31 PM 07/30/2023   10:05 AM 07/30/2023    9:54 AM  Vitals with BMI  Height 5\' 5"  5\' 5"    Weight 142 lbs 3 oz 135 lbs   BMI 23.66 22.47   Systolic 123  114  Diastolic 75  74  Pulse 80  74    General:  No acute distress,  Alert and oriented, Non-Toxic, Normal speech and affect Integument: Patient has a mobile firm mass approximately 1.5 cm in diameter at the right lateral canthus.  This is consistent with either a lipoma or an epidermal inclusion cyst.   Assessment/Plan Mass, right side of face: Discussed excision with the patient.  He would prefer to have it done as soon as possible.  He would like to have this done in the office as opposed to the operating room.  I think this is completely reasonable.  I had a cancellation this afternoon so I have offered him the opportunity to have it removed today.  He has agreed.  He understands that there is a risk of recurrence.  Procedure Note  Preoperative Dx: Mass, right side of face  Postoperative Dx: Same  Procedure: Excision of mass  Anesthesia: Lidocaine 1% with 1:100,000 epinephrine and 0.25% Sensorcaine   Indication for Procedure: Removal for pathologic diagnosis including the risk of recurrence  Description of Procedure: Risks and complications were explained to the patient including the possibility of recurrence.  Consent was confirmed and the patient understands the risks and benefits.  The potential complications and alternatives were  explained and the patient consents.  The patient expressed understanding the option of not having the procedure and the risks of a scar.  Time out was called and all information was confirmed to be correct.    The area was prepped and drapped.  Local anesthetic was injected in the subcutaneous tissues.  After waiting for the local to take affect a 1.5 cm elliptical incision was made over the mass.  With careful dissection the entire mass and its contents were removed.  It appeared to be an epidermal inclusion cyst and the entire cyst wall was removed.  The wound was irrigated with dilute peroxide..  After obtaining hemostasis, the surgical wound was closed with an interrupted 5-0 Monocryl suture in the deep tissues and interrupted 5-0 Prolene sutures and the skin.  The surgical wound measured 1.5 cm.  A dressing was applied.  The patient was given instructions on how to care for the area and a follow up appointment.  Amr tolerated the procedure well and there were no complications. The specimen was sent to pathology.   Santiago Glad 08/02/2023, 3:28 PM

## 2023-08-06 LAB — DERMATOLOGY PATHOLOGY

## 2023-08-11 ENCOUNTER — Ambulatory Visit: Admitting: Plastic Surgery

## 2023-08-11 VITALS — BP 124/78 | HR 70

## 2023-08-11 DIAGNOSIS — L720 Epidermal cyst: Secondary | ICD-10-CM

## 2023-08-11 NOTE — Progress Notes (Signed)
 [name] returns today approximately a week postop from removal of a skin lesion at the right lateral canthus.  He reports no specific problems and no specific pain.  Reviewed the pathology which showed an epidermal inclusion cyst  The incision is well-healed with sutures remaining in place.  There is no evidence of infection.  Sutures are removed today without difficulty.  I have asked him to massage the area once a day with cocoa butter or Vaseline.  He may return as needed.

## 2023-08-25 ENCOUNTER — Institutional Professional Consult (permissible substitution): Admitting: Plastic Surgery

## 2023-09-29 ENCOUNTER — Other Ambulatory Visit: Payer: Self-pay | Admitting: Neurology

## 2023-10-20 ENCOUNTER — Ambulatory Visit: Payer: Medicare HMO | Admitting: Neurology

## 2023-11-04 ENCOUNTER — Telehealth: Payer: Self-pay | Admitting: Neurology

## 2023-11-04 NOTE — Telephone Encounter (Signed)
 Patient left voicemail that he needs to see the doctor about his medications, had a seizure yesterday. Called pt back and stated he called EMS and they came but it was over when they got there, they took vitals. Did not go to hospital. Has not missed any medications, feeling fine today, laid down and slept after yesterday's event. States he needs to see doctor soon. Please advise

## 2023-11-04 NOTE — Telephone Encounter (Signed)
 Agree with avoiding alcohol and to call us  again for any additional events.

## 2023-11-04 NOTE — Telephone Encounter (Signed)
 Call to patient, he reports having a seizure yesterday. Aura began with seeing colors and then he was having a hard time seeing, tried to call 911 and unable to. Was able to get to door and holler for neighbor to call. He reports going to lay down and feeling better. He is not sure if he convulsed   but he thinks he bit his tongue. No loss of bladder control. He does report medication compliance and alcohol use. Reviewed seizure triggers and safety precautions. He verbalized understanding. Advised I would send to Dr. Gregg for review but to avoid alcohol as could be reason for breakthrough sz. Patient verbalized understanding.

## 2023-11-08 NOTE — Telephone Encounter (Signed)
 Call to patient, advised of Dr. Gregg recommendations of avoiding alcohol and letting us  know if future event. Patient verbalized understanding

## 2023-11-24 ENCOUNTER — Other Ambulatory Visit: Payer: Self-pay | Admitting: Neurology

## 2023-11-24 DIAGNOSIS — G40209 Localization-related (focal) (partial) symptomatic epilepsy and epileptic syndromes with complex partial seizures, not intractable, without status epilepticus: Secondary | ICD-10-CM

## 2023-12-01 ENCOUNTER — Other Ambulatory Visit: Payer: Self-pay

## 2023-12-01 ENCOUNTER — Encounter (HOSPITAL_COMMUNITY): Payer: Self-pay

## 2023-12-01 ENCOUNTER — Emergency Department (HOSPITAL_COMMUNITY)
Admission: EM | Admit: 2023-12-01 | Discharge: 2023-12-01 | Disposition: A | Discharge status: Home / Self Care | Attending: Emergency Medicine | Admitting: Emergency Medicine

## 2023-12-01 DIAGNOSIS — Z79899 Other long term (current) drug therapy: Secondary | ICD-10-CM | POA: Diagnosis not present

## 2023-12-01 DIAGNOSIS — H02843 Edema of right eye, unspecified eyelid: Secondary | ICD-10-CM | POA: Insufficient documentation

## 2023-12-01 DIAGNOSIS — M25511 Pain in right shoulder: Secondary | ICD-10-CM | POA: Diagnosis present

## 2023-12-01 DIAGNOSIS — M62830 Muscle spasm of back: Secondary | ICD-10-CM | POA: Diagnosis not present

## 2023-12-01 DIAGNOSIS — I10 Essential (primary) hypertension: Secondary | ICD-10-CM | POA: Insufficient documentation

## 2023-12-01 DIAGNOSIS — E119 Type 2 diabetes mellitus without complications: Secondary | ICD-10-CM | POA: Insufficient documentation

## 2023-12-01 DIAGNOSIS — G8929 Other chronic pain: Secondary | ICD-10-CM

## 2023-12-01 MED ORDER — LIDOCAINE 5 % EX PTCH
1.0000 | MEDICATED_PATCH | CUTANEOUS | Status: DC
  Administered 2023-12-01: 1 via TRANSDERMAL
  Filled 2023-12-01: qty 1

## 2023-12-01 MED ORDER — NAPROXEN 500 MG PO TABS: 500.0000 mg | ORAL_TABLET | Freq: Two times a day (BID) | ORAL | 0 refills | Status: AC

## 2023-12-01 MED ORDER — KETOROLAC TROMETHAMINE 15 MG/ML IJ SOLN
30.0000 mg | Freq: Once | INTRAMUSCULAR | Status: AC
  Administered 2023-12-01: 30 mg via INTRAMUSCULAR
  Filled 2023-12-01: qty 2

## 2023-12-01 MED ORDER — METHOCARBAMOL 500 MG PO TABS: 500.0000 mg | ORAL_TABLET | Freq: Two times a day (BID) | ORAL | 0 refills | Status: DC

## 2023-12-01 MED ORDER — METHOCARBAMOL 500 MG PO TABS
500.0000 mg | ORAL_TABLET | Freq: Once | ORAL | Status: AC
  Administered 2023-12-01: 500 mg via ORAL
  Filled 2023-12-01: qty 1

## 2023-12-01 MED ORDER — AMLODIPINE BESYLATE 5 MG PO TABS: 5.0000 mg | ORAL_TABLET | Freq: Every day | ORAL | 6 refills | Status: AC

## 2023-12-01 MED ORDER — LIDOCAINE-EPINEPHRINE (PF) 2 %-1:200000 IJ SOLN
10.0000 mL | Freq: Once | INTRAMUSCULAR | Status: AC
  Administered 2023-12-01: 10 mL
  Filled 2023-12-01: qty 20

## 2023-12-01 NOTE — ED Triage Notes (Signed)
 PT BIB [facility] from home d/t chronic R shoulder pain. PT endorses 10/10 pain, states increase in past week. PT endorses relief with previous cortisol shot. Aox4. Blind in R eye.   BP 170/88, HR 70, O2 95% on RA CBG 100

## 2023-12-01 NOTE — Discharge Instructions (Addendum)
 You can do heating pad and hot showers or warm compresses to the area to help with muscle pain and spasm.  You were also given anti-inflammatories and muscle relaxer.  You should not take either of these medications till this evening about 6pm because you got a dose in the ER.

## 2023-12-01 NOTE — ED Provider Notes (Signed)
 [place] EMERGENCY DEPARTMENT AT [facility] Provider Note   CSN: [id number] Arrival date & time: 12/01/23  9268     Patient presents with: Shoulder Pain   [name] is a 68 y.o. male.   Pt is a 68y/o male with hx of cocaine  abuse, DM, seizures, HTN who is presenting today with complaint of right shoulder pain.  He reports the pain has been present for the last 3 or 4 days.  It started out initially as a small ache which is gradually worsened and is now radiating down his arm.  It is worse with movement and with even some movements of his head.  His back and the posterior shoulder area.  No pain in the anterior portion of the shoulder.  He denies any injuries.  No fever or infectious symptoms.  No chest pain or shortness of breath.  He reports he has had this before and he was given a cortisone shot which fixed his pain.  The history is provided by the patient.  Shoulder Pain      Prior to Admission medications   Medication Sig Start Date End Date Taking? Authorizing Provider  amLODipine  (NORVASC ) 5 MG tablet Take 1 tablet (5 mg total) by mouth daily. 12/01/23  Yes Doretha Folks, MD  methocarbamol  (ROBAXIN ) 500 MG tablet Take 1 tablet (500 mg total) by mouth 2 (two) times daily. 12/01/23  Yes Doretha Folks, MD  naproxen  (NAPROSYN ) 500 MG tablet Take 1 tablet (500 mg total) by mouth 2 (two) times daily. 12/01/23  Yes Doretha Folks, MD  acetaminophen  (TYLENOL ) 325 MG tablet Take 2 tablets (650 mg total) by mouth every 6 (six) hours as needed. Patient not taking: Reported on 08/11/2023 12/05/21   Elnor Jayson LABOR, DO  Cholecalciferol (VITAMIN D ) 50 MCG (2000 UT) tablet Take 2,000 Units by mouth daily. 07/24/20   [provider]  divalproex  (DEPAKOTE  ER) 500 MG 24 hr tablet Take 4 tablets (2,000 mg total) by mouth in the morning and at bedtime. TAKE 4 TABLETS BY MOUTH TWICE DAILY 03/22/23 03/16/24  Camara, Amadou, MD  levETIRAcetam  (KEPPRA ) 750 MG tablet Take 1  tablet (750 mg total) by mouth 2 (two) times daily. 03/22/23 03/16/24  Camara, Amadou, MD  pantoprazole  (PROTONIX ) 40 MG tablet TAKE 1 TABLET (40 MG TOTAL) BY MOUTH 2 (TWO) TIMES DAILY. 01/06/21   Legrand Victory LITTIE DOUGLAS, MD  phenytoin  (DILANTIN ) 100 MG ER capsule TAKE 2 CAPSULES (200 MG TOTAL) BY MOUTH IN THE MORNING. 11/24/23 11/18/24  Gregg Lek, MD  phenytoin  (DILANTIN ) 50 MG tablet CHEW 1 TABLET (50 MG TOTAL) BY MOUTH AT BEDTIME. 03/22/23   Camara, Amadou, MD  Vitamin D , Ergocalciferol , (DRISDOL ) 1.25 MG (50000 UNIT) CAPS capsule TAKE 1 CAPSULE (50,000 UNITS TOTAL) BY MOUTH EVERY 7 (SEVEN) DAYS. 09/29/23   Camara, Amadou, MD    Allergies: Mobic [meloxicam]    Review of Systems  Updated Vital Signs BP (!) 176/119   Pulse 73   Temp 98.2 F (36.8 C) (Oral)   Ht 5' 5 (1.651 m)   Wt 59 kg   SpO2 100%   BMI 21.63 kg/m   Physical Exam Vitals reviewed.  Constitutional:      Appearance: Normal appearance.  HENT:     Head: Normocephalic.  Eyes:     Comments: Right eyelid swollen with some mild drainage from the eye with recent cyst removal  Cardiovascular:     Rate and Rhythm: Normal rate.     Pulses: Normal  pulses.  Pulmonary:     Effort: Pulmonary effort is normal. No respiratory distress.  Musculoskeletal:        General: Tenderness present.       Arms:     Comments: Significant palpable muscle spasm and tenderness in the right trapezius area which is improved with localized pressure.  Full range of motion of the shoulder but he does stop because he states that makes it hurt.  Pain reproduced with turning his head to the left and looking down.  No neck pain.  Normal handgrip on the right with normal sensation in the hand  Neurological:     Mental Status: He is alert. Mental status is at baseline.  Psychiatric:        Mood and Affect: Mood normal.     (all labs ordered are listed, but only abnormal results are displayed) Labs Reviewed - No data to  display  EKG: None  Radiology: No results found.   Procedures   Medications Ordered in the ED  lidocaine  (LIDODERM ) 5 % 1 patch (has no administration in time range)  methocarbamol  (ROBAXIN ) tablet 500 mg (has no administration in time range)  lidocaine -EPINEPHrine  (XYLOCAINE  W/EPI) 2 %-1:200000 (PF) injection 10 mL (10 mLs Infiltration Given by Other 12/01/23 0810)  ketorolac  (TORADOL ) 15 MG/ML injection 30 mg (30 mg Intramuscular Given 12/01/23 0806)                                    Medical Decision Making Risk Prescription drug management.   Here today with symptoms most classic for a trapezius spasm.  Patient has no findings concerning for a septic shoulder or infectious process.  Low suspicion for cardiac process.  Trigger point injection given and a dose of NSAIDs.  Patient will be given muscle relaxer and Lidoderm  patches.  Instructed to use heat to the area.  Given Ortho follow-up if symptoms persist. Patient was noted to be hypertensive here at 177/88.  Speaking with him and discussing that he had been on amlodipine  5 mg he reports that he thinks he has run out of that medication and he needs a new prescription.  He can follow-up with [facility] health on Friday as he reports he already has an appointment at 4 PM.     Final diagnoses:  Uncontrolled hypertension  Chronic right shoulder pain  Spasm of right trapezius muscle    ED Discharge Orders          Ordered    methocarbamol  (ROBAXIN ) 500 MG tablet  2 times daily        12/01/23 0822    naproxen  (NAPROSYN ) 500 MG tablet  2 times daily        12/01/23 0822    amLODipine  (NORVASC ) 5 MG tablet  Daily        12/01/23 0822               Doretha Folks, MD 12/01/23 [phone]

## 2023-12-09 ENCOUNTER — Emergency Department (HOSPITAL_COMMUNITY)
Admission: EM | Admit: 2023-12-09 | Discharge: 2023-12-09 | Discharge status: Against Medical Advice | Attending: Emergency Medicine | Admitting: Emergency Medicine

## 2023-12-09 ENCOUNTER — Encounter (HOSPITAL_COMMUNITY): Payer: Self-pay | Admitting: Emergency Medicine

## 2023-12-09 DIAGNOSIS — R42 Dizziness and giddiness: Secondary | ICD-10-CM | POA: Diagnosis present

## 2023-12-09 DIAGNOSIS — Z5321 Procedure and treatment not carried out due to patient leaving prior to being seen by health care provider: Secondary | ICD-10-CM | POA: Diagnosis not present

## 2023-12-09 DIAGNOSIS — R519 Headache, unspecified: Secondary | ICD-10-CM | POA: Insufficient documentation

## 2023-12-09 LAB — BASIC METABOLIC PANEL WITH GFR
Anion gap: 9 (ref 5–15)
BUN: 33 mg/dL — ABNORMAL HIGH (ref 8–23)
CO2: 26 mmol/L (ref 22–32)
Calcium: 9 mg/dL (ref 8.9–10.3)
Chloride: 108 mmol/L (ref 98–111)
Creatinine, Ser: 0.89 mg/dL (ref 0.61–1.24)
GFR, Estimated: >60 mL/min (ref >60)
Glucose, Bld: 82 mg/dL (ref 70–99)
Potassium: 4.4 mmol/L (ref 3.5–5.1)
Sodium: 143 mmol/L (ref 135–145)

## 2023-12-09 LAB — CBC WITH DIFFERENTIAL/PLATELET
Abs Immature Granulocytes: 0.01 K/uL (ref 0.00–0.07)
Basophils Absolute: 0 K/uL (ref 0.0–0.1)
Basophils Relative: 1 %
Eosinophils Absolute: 0.3 K/uL (ref 0.0–0.5)
Eosinophils Relative: 4 %
HCT: 42 % (ref 39.0–52.0)
Hemoglobin: 13.7 g/dL (ref 13.0–17.0)
Immature Granulocytes: 0 %
Lymphocytes Relative: 55 %
Lymphs Abs: 4 K/uL (ref 0.7–4.0)
MCH: 29.9 pg (ref 26.0–34.0)
MCHC: 32.6 g/dL (ref 30.0–36.0)
MCV: 91.7 fL (ref 80.0–100.0)
Monocytes Absolute: 0.7 K/uL (ref 0.1–1.0)
Monocytes Relative: 10 %
Neutro Abs: 2.2 K/uL (ref 1.7–7.7)
Neutrophils Relative %: 30 %
Platelets: 290 K/uL (ref 150–400)
RBC: 4.58 MIL/uL (ref 4.22–5.81)
RDW: 14.2 % (ref 11.5–15.5)
WBC: 7.2 K/uL (ref 4.0–10.5)
nRBC: 0 % (ref 0.0–0.2)

## 2023-12-09 LAB — URINALYSIS, ROUTINE W REFLEX MICROSCOPIC
Bilirubin Urine: NEGATIVE
Glucose, UA: NEGATIVE mg/dL
Hgb urine dipstick: NEGATIVE
Ketones, ur: NEGATIVE mg/dL
Leukocytes,Ua: NEGATIVE
Nitrite: NEGATIVE
Protein, ur: NEGATIVE mg/dL
Specific Gravity, Urine: 1.028 (ref 1.005–1.030)
pH: 5 (ref 5.0–8.0)

## 2023-12-09 LAB — TROPONIN I (HIGH SENSITIVITY): Troponin I (High Sensitivity): 4 ng/L (ref <18)

## 2023-12-09 NOTE — ED Triage Notes (Addendum)
 Pt here with c/o dizziness and headache , recently started on lorsartin for his htn , no n/v , unsure is he took his meds today

## 2023-12-09 NOTE — ED Notes (Signed)
 Pt wanted IV removed and left ED. Pt taken OTF.

## 2023-12-09 NOTE — ED Provider Triage Note (Signed)
 Emergency Medicine Provider Triage Evaluation Note  [name] , a 68 y.o. male  was evaluated in triage.  Pt complains of dizziness and headache when he stands up.  Has been on and off for a few weeks.  Recently started blood pressure medicine.  Review of Systems  Positive: Dizziness Negative: Abdominal pain chest pain  Physical Exam  BP 115/75   Pulse 64   Temp 98.1 F (36.7 C)   Resp 17   SpO2 97%  Gen:   Awake, no distress   Resp:  Normal effort  MSK:   Moves extremities without difficulty  Other:    Medical Decision Making  Medically screening exam initiated at 11:00 AM.  Appropriate orders placed.  [name] was informed that the remainder of the evaluation will be completed by another provider, this initial triage assessment does not replace that evaluation, and the importance of remaining in the ED until their evaluation is complete.     Towana Ozell BROCKS, MD 12/09/23 [phone]

## 2023-12-10 ENCOUNTER — Other Ambulatory Visit: Payer: Self-pay

## 2023-12-10 ENCOUNTER — Emergency Department (HOSPITAL_COMMUNITY)
Admission: EM | Admit: 2023-12-10 | Discharge: 2023-12-10 | Disposition: A | Discharge status: Home / Self Care | Attending: Emergency Medicine | Admitting: Emergency Medicine

## 2023-12-10 DIAGNOSIS — Z79899 Other long term (current) drug therapy: Secondary | ICD-10-CM | POA: Insufficient documentation

## 2023-12-10 DIAGNOSIS — R030 Elevated blood-pressure reading, without diagnosis of hypertension: Secondary | ICD-10-CM | POA: Insufficient documentation

## 2023-12-10 DIAGNOSIS — E119 Type 2 diabetes mellitus without complications: Secondary | ICD-10-CM | POA: Diagnosis not present

## 2023-12-10 DIAGNOSIS — Z0130 Encounter for examination of blood pressure without abnormal findings: Secondary | ICD-10-CM

## 2023-12-10 DIAGNOSIS — I10 Essential (primary) hypertension: Secondary | ICD-10-CM | POA: Insufficient documentation

## 2023-12-10 NOTE — ED Provider Notes (Signed)
 [place] EMERGENCY DEPARTMENT AT [facility] Provider Note   CSN: [id number] Arrival date & time: 12/10/23  9385     Patient presents with: Blood Pressure Check    [name] is a 68 y.o. male.   Patient with history of diabetes, epilepsy, hypertension, stroke presents today requesting to have his blood pressure checked.  He reports that his medications were recently changed and he wanted to see if it helped his blood pressure readings. Reports he was seen yesterday for headache and dizziness and saw his blood pressure was elevated and assumed that was the cause. He thinks he did not take his medication like he has been prescribed yesterday. Denies any symptoms today, reports he feels well.  The history is provided by the patient. No language interpreter was used.       Prior to Admission medications   Medication Sig Start Date End Date Taking? Authorizing Provider  acetaminophen  (TYLENOL ) 325 MG tablet Take 2 tablets (650 mg total) by mouth every 6 (six) hours as needed. Patient not taking: Reported on 08/11/2023 12/05/21   Elnor Savant A, DO  amLODipine  (NORVASC ) 5 MG tablet Take 1 tablet (5 mg total) by mouth daily. 12/01/23   Doretha Folks, MD  Cholecalciferol (VITAMIN D ) 50 MCG (2000 UT) tablet Take 2,000 Units by mouth daily. 07/24/20   [provider]  divalproex  (DEPAKOTE  ER) 500 MG 24 hr tablet Take 4 tablets (2,000 mg total) by mouth in the morning and at bedtime. TAKE 4 TABLETS BY MOUTH TWICE DAILY 03/22/23 03/16/24  Camara, Amadou, MD  levETIRAcetam  (KEPPRA ) 750 MG tablet Take 1 tablet (750 mg total) by mouth 2 (two) times daily. 03/22/23 03/16/24  Camara, Amadou, MD  methocarbamol  (ROBAXIN ) 500 MG tablet Take 1 tablet (500 mg total) by mouth 2 (two) times daily. 12/01/23   Doretha Folks, MD  naproxen  (NAPROSYN ) 500 MG tablet Take 1 tablet (500 mg total) by mouth 2 (two) times daily. 12/01/23   Doretha Folks, MD  pantoprazole  (PROTONIX ) 40 MG  tablet TAKE 1 TABLET (40 MG TOTAL) BY MOUTH 2 (TWO) TIMES DAILY. 01/06/21   Legrand Victory LITTIE DOUGLAS, MD  phenytoin  (DILANTIN ) 100 MG ER capsule TAKE 2 CAPSULES (200 MG TOTAL) BY MOUTH IN THE MORNING. 11/24/23 11/18/24  Gregg Lek, MD  phenytoin  (DILANTIN ) 50 MG tablet CHEW 1 TABLET (50 MG TOTAL) BY MOUTH AT BEDTIME. 03/22/23   Camara, Amadou, MD  Vitamin D , Ergocalciferol , (DRISDOL ) 1.25 MG (50000 UNIT) CAPS capsule TAKE 1 CAPSULE (50,000 UNITS TOTAL) BY MOUTH EVERY 7 (SEVEN) DAYS. 09/29/23   Camara, Amadou, MD    Allergies: Mobic [meloxicam]    Review of Systems  All other systems reviewed and are negative.   Updated Vital Signs BP 138/88 (BP Location: Right Arm)   Pulse 65   Temp 98.4 F (36.9 C) (Oral)   Resp 20   SpO2 100%   Physical Exam Vitals and nursing note reviewed.  Constitutional:      General: He is not in acute distress.    Appearance: Normal appearance. He is normal weight. He is not ill-appearing, toxic-appearing or diaphoretic.  HENT:     Head: Normocephalic and atraumatic.  Cardiovascular:     Rate and Rhythm: Normal rate and regular rhythm.     Heart sounds: Normal heart sounds.  Pulmonary:     Effort: Pulmonary effort is normal. No respiratory distress.     Breath sounds: Normal breath sounds.  Abdominal:     General: Abdomen  is flat.     Palpations: Abdomen is soft.     Tenderness: There is no abdominal tenderness.  Musculoskeletal:        General: Normal range of motion.     Cervical back: Normal range of motion.  Skin:    General: Skin is warm and dry.  Neurological:     General: No focal deficit present.     Mental Status: He is alert.  Psychiatric:        Mood and Affect: Mood normal.        Behavior: Behavior normal.     (all labs ordered are listed, but only abnormal results are displayed) Labs Reviewed - No data to display  EKG: None  Radiology: No results found.   Procedures   Medications Ordered in the ED - No data to display                                   Medical Decision Making  Patient presents today requesting to have his blood pressure checked.  He is afebrile, nontoxic-appearing, and in no acute distress with reassuring vital signs.  Physical exam unremarkable.  Chart reviewed, looks like he was seen yesterday and had labs ordered and obtained that were benign, however left without being seen.  He denies any symptoms today, reports he feels well.  He took his medication as prescribed today.  His blood pressure is 130/80 today.  Discussed same with patient, recommend close outpatient follow-up and return precautions. Evaluation and diagnostic testing in the emergency department does not suggest an emergent condition requiring admission or immediate intervention beyond what has been performed at this time.  Plan for discharge with close PCP follow-up.  Patient is understanding and amenable with plan, educated on red flag symptoms that would prompt immediate return.  Patient discharged in stable condition.  Final diagnoses:  Blood pressure check    ED Discharge Orders     None     An After Visit Summary was printed and given to the patient.      Daylynn Stumpp A, PA-C 12/10/23 9288    Levander Houston, MD 12/16/23 [phone]

## 2023-12-10 NOTE — Discharge Instructions (Signed)
 Please continue to take your blood pressure medications as prescribed.  Please call your primary care provider to schedule a close follow-up appointment as they should be the ones managing your blood pressure.  Return if development of any new or worsening symptoms

## 2023-12-10 NOTE — ED Triage Notes (Addendum)
 Patient requesting blood pressure check this morning . No other complaints . Ambulatory/respirations unlabored .

## 2023-12-21 ENCOUNTER — Other Ambulatory Visit: Payer: Self-pay | Admitting: Neurology

## 2024-03-01 ENCOUNTER — Emergency Department (HOSPITAL_COMMUNITY)
Admission: EM | Admit: 2024-03-01 | Discharge: 2024-03-01 | Disposition: A | Discharge status: Home / Self Care | Attending: Emergency Medicine | Admitting: Emergency Medicine

## 2024-03-01 ENCOUNTER — Other Ambulatory Visit: Payer: Self-pay

## 2024-03-01 ENCOUNTER — Emergency Department (HOSPITAL_COMMUNITY)

## 2024-03-01 ENCOUNTER — Encounter (HOSPITAL_COMMUNITY): Payer: Self-pay

## 2024-03-01 DIAGNOSIS — I10 Essential (primary) hypertension: Secondary | ICD-10-CM | POA: Insufficient documentation

## 2024-03-01 DIAGNOSIS — M25511 Pain in right shoulder: Secondary | ICD-10-CM | POA: Insufficient documentation

## 2024-03-01 DIAGNOSIS — R0789 Other chest pain: Secondary | ICD-10-CM | POA: Insufficient documentation

## 2024-03-01 DIAGNOSIS — Z79899 Other long term (current) drug therapy: Secondary | ICD-10-CM | POA: Insufficient documentation

## 2024-03-01 LAB — CBC WITH DIFFERENTIAL/PLATELET
Abs Immature Granulocytes: 0.02 K/uL (ref 0.00–0.07)
Basophils Absolute: 0 K/uL (ref 0.0–0.1)
Basophils Relative: 0 %
Eosinophils Absolute: 0.1 K/uL (ref 0.0–0.5)
Eosinophils Relative: 1 %
HCT: 44 % (ref 39.0–52.0)
Hemoglobin: 14.4 g/dL (ref 13.0–17.0)
Immature Granulocytes: 0 %
Lymphocytes Relative: 38 %
Lymphs Abs: 3.8 K/uL (ref 0.7–4.0)
MCH: 30 pg (ref 26.0–34.0)
MCHC: 32.7 g/dL (ref 30.0–36.0)
MCV: 91.7 fL (ref 80.0–100.0)
Monocytes Absolute: 1 K/uL (ref 0.1–1.0)
Monocytes Relative: 10 %
Neutro Abs: 5 K/uL (ref 1.7–7.7)
Neutrophils Relative %: 51 %
Platelets: 197 K/uL (ref 150–400)
RBC: 4.8 MIL/uL (ref 4.22–5.81)
RDW: 14.6 % (ref 11.5–15.5)
WBC: 10.1 K/uL (ref 4.0–10.5)
nRBC: 0 % (ref 0.0–0.2)

## 2024-03-01 LAB — TROPONIN I (HIGH SENSITIVITY): Troponin I (High Sensitivity): 4 ng/L (ref <18)

## 2024-03-01 LAB — BASIC METABOLIC PANEL WITH GFR
Anion gap: 13 (ref 5–15)
BUN: 18 mg/dL (ref 8–23)
CO2: 25 mmol/L (ref 22–32)
Calcium: 8.8 mg/dL — ABNORMAL LOW (ref 8.9–10.3)
Chloride: 102 mmol/L (ref 98–111)
Creatinine, Ser: 0.87 mg/dL (ref 0.61–1.24)
GFR, Estimated: >60 mL/min (ref >60)
Glucose, Bld: 134 mg/dL — ABNORMAL HIGH (ref 70–99)
Potassium: 4 mmol/L (ref 3.5–5.1)
Sodium: 140 mmol/L (ref 135–145)

## 2024-03-01 LAB — MAGNESIUM: Magnesium: 2 mg/dL (ref 1.7–2.4)

## 2024-03-01 MED ORDER — METHOCARBAMOL 500 MG PO TABS: 500.0000 mg | ORAL_TABLET | Freq: Two times a day (BID) | ORAL | 0 refills | Status: AC

## 2024-03-01 MED ORDER — METHOCARBAMOL 500 MG PO TABS
500.0000 mg | ORAL_TABLET | Freq: Once | ORAL | Status: AC
Start: 2024-03-01 — End: 2024-03-01
  Administered 2024-03-01: 500 mg via ORAL
  Filled 2024-03-01: qty 1

## 2024-03-01 MED ORDER — ETODOLAC 400 MG PO TABS: 400.0000 mg | ORAL_TABLET | Freq: Two times a day (BID) | ORAL | 0 refills | Status: AC

## 2024-03-01 MED ORDER — KETOROLAC TROMETHAMINE 15 MG/ML IJ SOLN
15.0000 mg | Freq: Once | INTRAMUSCULAR | Status: AC
  Administered 2024-03-01: 15 mg via INTRAVENOUS
  Filled 2024-03-01: qty 1

## 2024-03-01 NOTE — ED Notes (Signed)
 Pt verbalizes understanding of DC instructions. Pt belongings returned and is ambulatory out of ED.   Gave bus pass

## 2024-03-01 NOTE — ED Provider Notes (Signed)
 [place] EMERGENCY DEPARTMENT AT [facility] Provider Note   CSN: [id number] Arrival date & time: 03/01/24  9379     Patient presents with: Shoulder Pain   [name] is a 68 y.o. male.   68 year old male presents today for concern of right shoulder pain, as well as chest wall pain.  Pain is worse with range of motion and palpation.  He has history of hypertension, coronary calcifications.  Reports compliance with his home medications.  He took Tylenol  without any significant improvement.  Denies any recent long travel, recent surgery, prior history of DVT or PE.  Currently denies any shortness of breath.  The history is provided by the patient. No language interpreter was used.       Prior to Admission medications   Medication Sig Start Date End Date Taking? Authorizing Provider  acetaminophen  (TYLENOL ) 325 MG tablet Take 2 tablets (650 mg total) by mouth every 6 (six) hours as needed. Patient not taking: Reported on 08/11/2023 12/05/21   Elnor Jayson LABOR, DO  amLODipine  (NORVASC ) 5 MG tablet Take 1 tablet (5 mg total) by mouth daily. 12/01/23   Doretha Folks, MD  Cholecalciferol (VITAMIN D ) 50 MCG (2000 UT) tablet Take 2,000 Units by mouth daily. 07/24/20   [provider]  divalproex  (DEPAKOTE  ER) 500 MG 24 hr tablet Take 4 tablets (2,000 mg total) by mouth in the morning and at bedtime. TAKE 4 TABLETS BY MOUTH TWICE DAILY 03/22/23 03/16/24  Camara, Amadou, MD  levETIRAcetam  (KEPPRA ) 750 MG tablet TAKE 1 TABLET (750 MG TOTAL) BY MOUTH 2 (TWO) TIMES DAILY. 12/22/23 12/16/24  Camara, Amadou, MD  methocarbamol  (ROBAXIN ) 500 MG tablet Take 1 tablet (500 mg total) by mouth 2 (two) times daily. 12/01/23   Doretha Folks, MD  naproxen  (NAPROSYN ) 500 MG tablet Take 1 tablet (500 mg total) by mouth 2 (two) times daily. 12/01/23   Doretha Folks, MD  pantoprazole  (PROTONIX ) 40 MG tablet TAKE 1 TABLET (40 MG TOTAL) BY MOUTH 2 (TWO) TIMES DAILY. 01/06/21   Legrand Victory LITTIE DOUGLAS, MD  phenytoin  (DILANTIN ) 100 MG ER capsule TAKE 2 CAPSULES (200 MG TOTAL) BY MOUTH IN THE MORNING. 11/24/23 11/18/24  Gregg Lek, MD  phenytoin  (DILANTIN ) 50 MG tablet CHEW 1 TABLET (50 MG TOTAL) BY MOUTH AT BEDTIME. 03/22/23   Camara, Amadou, MD  Vitamin D , Ergocalciferol , (DRISDOL ) 1.25 MG (50000 UNIT) CAPS capsule TAKE 1 CAPSULE (50,000 UNITS TOTAL) BY MOUTH EVERY 7 (SEVEN) DAYS. 09/29/23   Camara, Amadou, MD    Allergies: Mobic [meloxicam]    Review of Systems  Constitutional:  Negative for chills and fever.  Respiratory:  Negative for shortness of breath.   Cardiovascular:  Positive for chest pain. Negative for palpitations and leg swelling.  Gastrointestinal:  Negative for abdominal pain.  Musculoskeletal:  Positive for arthralgias. Negative for joint swelling.  Neurological:  Negative for light-headedness.  All other systems reviewed and are negative.   Updated Vital Signs BP (!) 148/92 (BP Location: Right Arm)   Pulse 79   Temp 98.4 F (36.9 C) (Oral)   Resp 16   Ht 5' 5 (1.651 m)   Wt 66.7 kg   SpO2 100%   BMI 24.46 kg/m   Physical Exam Vitals and nursing note reviewed.  Constitutional:      General: He is not in acute distress.    Appearance: Normal appearance. He is not ill-appearing.  HENT:     Head: Normocephalic and atraumatic.  Nose: Nose normal.  Eyes:     Conjunctiva/sclera: Conjunctivae normal.  Cardiovascular:     Rate and Rhythm: Normal rate and regular rhythm.  Pulmonary:     Effort: Pulmonary effort is normal. No respiratory distress.  Musculoskeletal:        General: No deformity. Normal range of motion.     Cervical back: Normal range of motion.     Comments: No deformity to the right shoulder.  Mild tenderness to palpation.  Good range of motion actively and passively.  Neurovascularly intact.  Strength intact in bilateral upper extremities.  Cervical spine without tenderness to palpation.  Skin:    Findings: No rash.  Neurological:      Mental Status: He is alert.     (all labs ordered are listed, but only abnormal results are displayed) Labs Reviewed  CBC WITH DIFFERENTIAL/PLATELET  BASIC METABOLIC PANEL WITH GFR  MAGNESIUM   TROPONIN I (HIGH SENSITIVITY)    EKG: None  Radiology: No results found.   Procedures   Medications Ordered in the ED  ketorolac  (TORADOL ) 15 MG/ML injection 15 mg (has no administration in time range)  methocarbamol  (ROBAXIN ) tablet 500 mg (has no administration in time range)                                    Medical Decision Making Amount and/or Complexity of Data Reviewed Labs: ordered. Radiology: ordered.  Risk Prescription drug management.   Medical Decision Making / ED Course   This patient presents to the ED for concern of right shoulder pain, chest pain, this involves an extensive number of treatment options, and is a complaint that carries with it a high risk of complications and morbidity.  The differential diagnosis includes ACS, PE, pneumonia, muscle strain, costochondritis  MDM: 68 year old male presents today with above-mentioned complaints. Overall he is well-appearing.  Hemodynamically stable. Given his history of coronary artery calcifications and hypertension and given he is having chest pain without any trauma will evaluate for ACS.  Will give him Toradol  and Robaxin  for symptom management. Low risk for PE on Wells criteria.  Not hypoxic, tachycardic, or tachypneic.  Low suspicion for this.  Improved on reevaluation after the medication.  CBC unremarkable, BMP without acute concern.  Magnesium  2.0.  Troponin negative.  Given duration of symptoms do not feel we need a repeat troponin.  Chest x-ray, right shoulder x-ray without acute concerns. Likely muscle strain. Pain is reproducible on exam especially the chest wall pain  Patient discharged in stable condition.  Return precaution discussed.  Patient voices understanding and is in agreement with  plan.      Lab Tests: -I ordered, reviewed, and interpreted labs.   The pertinent results include:   Labs Reviewed  CBC WITH DIFFERENTIAL/PLATELET  BASIC METABOLIC PANEL WITH GFR  MAGNESIUM   TROPONIN I (HIGH SENSITIVITY)      EKG  EKG Interpretation Date/Time:    Ventricular Rate:    PR Interval:    QRS Duration:    QT Interval:    QTC Calculation:   R Axis:      Text Interpretation:           Imaging Studies ordered: I ordered imaging studies including right shoulder x-ray, chest x-ray I independently visualized and interpreted imaging. I agree with the radiologist interpretation   Medicines ordered and prescription drug management: Meds ordered this encounter  Medications   ketorolac  (TORADOL )  15 MG/ML injection 15 mg   methocarbamol  (ROBAXIN ) tablet 500 mg    -I have reviewed the patients home medicines and have made adjustments as needed   Reevaluation: After the interventions noted above, I reevaluated the patient and found that they have :improved  Co morbidities that complicate the patient evaluation  Past Medical History:  Diagnosis Date   Acute gastric ulcer without hemorrhage or perforation    Atherosclerosis of aorta 08/05/2020   Blindness of right eye    Cocaine  abuse (HCC)    Coronary artery calcification seen on CAT scan 08/05/2020   CTA Chest & AP 08/04/20: Severe proximal left anterior descending and left circumflex coronary arterial atherosclerotic calcifications   Diabetes mellitus    Epilepsy (HCC)    As reported by patient   Gastric ulcer due to Helicobacter pylori 08/06/2020    STOMACH, ANTRUM AND BODY, BIOPSY:  Gastric antral and oxyntic mucosa with Helicobacter pylori-associated gastritis (confirmed with Warthin Starry stain)    Hypertension 01/24/2017   Localization-related symptomatic epilepsy and epileptic syndromes with complex partial seizures, not intractable, without status epilepticus (HCC) 01/29/2015   LUNG ABSCESS  03/14/2008   Qualifier: Diagnosis of  By: Darlean MD, Ozell B    LVH (left ventricular hypertrophy) 08/05/2020   transthoracic echocardiogram 08/05/20: moderate asymmetric left ventricular hypertrophy of the basal-septal segment. Left ventricular diastolic parameters are consistent with Grade I diastolic dysfunction    Multiple gallstones 08/05/2020   CTA 08/04/20: multiple peripherally calcified infundibular gallstones   Overdose 01/24/2017   Sciatica of right side 04/30/2016   Seizures (HCC)    since age 59;last seizure 3-4 years ago per pt   Stroke [facility]) 2 years ago   Tobacco dependence with current use 08/05/2020      Dispostion: Discharged in stable condition.  Return precaution discussed.  Patient voices understanding and is in agreement with plan.  Final diagnoses:  Chest wall pain  Acute pain of right shoulder    ED Discharge Orders          Ordered    etodolac (LODINE) 400 MG tablet  2 times daily        03/01/24 0842    methocarbamol  (ROBAXIN ) 500 MG tablet  2 times daily        03/01/24 0842               Hildegard Loge, PA-C 03/01/24 1525    Tonia Chew, MD 03/07/24 [phone]

## 2024-03-01 NOTE — Discharge Instructions (Addendum)
 You had some improvement in your symptoms after the medicines in the emergency department.  I have sent Lodine which is an anti-inflammatory medication into the pharmacy for you.  Do not combine this with ibuprofen  or Aleve  as they are all in the same class of medicines.  Robaxin  is a muscle relaxer.  It will make you drowsy.  Do not do anything dangerous after taking this medication.  Return for any concerning symptoms.  Your heart and lungs did not show any concerning findings.  No evidence of heart attack today.  Chest x-ray and shoulder x-ray did not show any concerning findings.  There is some arthritis in the right shoulder.  Follow-up with your primary care doctor.

## 2024-03-01 NOTE — ED Triage Notes (Addendum)
 PT arrives via EMS from home with a complaint of muscle spasms in the R side of his ribs/chest and R shoulder. Pt complains it worsens sometimes when he exhales. Reports a Hx of the same but is worse tonight.SABRA

## 2024-05-16 ENCOUNTER — Other Ambulatory Visit: Payer: Self-pay

## 2024-05-16 ENCOUNTER — Other Ambulatory Visit: Payer: Self-pay | Admitting: Neurology

## 2024-05-16 DIAGNOSIS — G40209 Localization-related (focal) (partial) symptomatic epilepsy and epileptic syndromes with complex partial seizures, not intractable, without status epilepticus: Secondary | ICD-10-CM

## 2024-05-16 MED ORDER — DIVALPROEX SODIUM ER 500 MG PO TB24: 2000.0000 mg | ORAL_TABLET | Freq: Two times a day (BID) | ORAL | 3 refills | Status: AC

## 2024-06-07 ENCOUNTER — Ambulatory Visit: Admitting: Neurology
